# Patient Record
Sex: Female | Born: 1990 | Race: White | Hispanic: No | Marital: Married | Smoking: Never smoker
Health system: Southern US, Community
[De-identification: ages and names within clinical notes are randomized; demographics above are authoritative.]

## PROBLEM LIST (undated history)

## (undated) DIAGNOSIS — D649 Anemia, unspecified: Secondary | ICD-10-CM

## (undated) DIAGNOSIS — K219 Gastro-esophageal reflux disease without esophagitis: Secondary | ICD-10-CM

## (undated) DIAGNOSIS — U071 COVID-19: Secondary | ICD-10-CM

## (undated) DIAGNOSIS — T4145XA Adverse effect of unspecified anesthetic, initial encounter: Secondary | ICD-10-CM

## (undated) DIAGNOSIS — O1490 Unspecified pre-eclampsia, unspecified trimester: Secondary | ICD-10-CM

## (undated) DIAGNOSIS — B019 Varicella without complication: Secondary | ICD-10-CM

## (undated) DIAGNOSIS — E039 Hypothyroidism, unspecified: Secondary | ICD-10-CM

## (undated) DIAGNOSIS — T8859XA Other complications of anesthesia, initial encounter: Secondary | ICD-10-CM

## (undated) HISTORY — DX: Anemia, unspecified: D64.9

## (undated) HISTORY — DX: Varicella without complication: B01.9

## (undated) HISTORY — PX: WISDOM TOOTH EXTRACTION: SHX21

## (undated) HISTORY — DX: Hypothyroidism, unspecified: E03.9

## (undated) HISTORY — DX: COVID-19: U07.1

## (undated) HISTORY — DX: Gastro-esophageal reflux disease without esophagitis: K21.9

---
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
## 2011-04-18 ENCOUNTER — Emergency Department: Payer: Self-pay | Admitting: Emergency Medicine

## 2013-06-12 ENCOUNTER — Observation Stay: Payer: Self-pay

## 2013-06-12 LAB — URINALYSIS, COMPLETE
Bilirubin,UR: NEGATIVE
Blood: NEGATIVE
Glucose,UR: NEGATIVE mg/dL (ref 0–75)
Ketone: NEGATIVE
Leukocyte Esterase: NEGATIVE
Ph: 6 (ref 4.5–8.0)
Protein: NEGATIVE
RBC,UR: 1 /HPF (ref 0–5)
Squamous Epithelial: 2
WBC UR: 3 /HPF (ref 0–5)

## 2013-09-25 ENCOUNTER — Inpatient Hospital Stay: Payer: Self-pay | Admitting: Obstetrics and Gynecology

## 2013-09-25 LAB — PIH PROFILE
Anion Gap: 10 (ref 7–16)
BUN: 13 mg/dL (ref 7–18)
Chloride: 107 mmol/L (ref 98–107)
Co2: 21 mmol/L (ref 21–32)
Creatinine: 0.77 mg/dL (ref 0.60–1.30)
EGFR (African American): >60
Glucose: 77 mg/dL (ref 65–99)
MCV: 95 fL (ref 80–100)
Osmolality: 275 (ref 275–301)
Platelet: 142 10*3/uL — ABNORMAL LOW (ref 150–440)
Potassium: 3.9 mmol/L (ref 3.5–5.1)
RDW: 13.3 % (ref 11.5–14.5)
Sodium: 138 mmol/L (ref 136–145)
WBC: 9.3 10*3/uL (ref 3.6–11.0)

## 2013-09-25 LAB — PROTEIN / CREATININE RATIO, URINE: Protein/Creat. Ratio: 944 mg/gCREAT — ABNORMAL HIGH (ref 0–200)

## 2013-09-26 LAB — PLATELET COUNT: Platelet: 147 10*3/uL — ABNORMAL LOW (ref 150–440)

## 2013-09-26 LAB — GC/CHLAMYDIA PROBE AMP

## 2013-09-27 LAB — HEMATOCRIT: HCT: 32 % — ABNORMAL LOW (ref 35.0–47.0)

## 2013-09-30 ENCOUNTER — Emergency Department: Payer: Self-pay | Admitting: Emergency Medicine

## 2013-09-30 LAB — CBC WITH DIFFERENTIAL/PLATELET
Basophil #: 0 10*3/uL (ref 0.0–0.1)
Basophil %: 0.1 %
Eosinophil #: 0 10*3/uL (ref 0.0–0.7)
Eosinophil %: 0.3 %
HCT: 29.4 % — ABNORMAL LOW (ref 35.0–47.0)
HGB: 10.3 g/dL — ABNORMAL LOW (ref 12.0–16.0)
Lymphocyte #: 1.1 10*3/uL (ref 1.0–3.6)
Lymphocyte %: 9.9 %
Monocyte #: 0.7 x10 3/mm (ref 0.2–0.9)
Monocyte %: 7 %
RBC: 3.09 10*6/uL — ABNORMAL LOW (ref 3.80–5.20)
RDW: 12.9 % (ref 11.5–14.5)

## 2013-09-30 LAB — URINALYSIS, COMPLETE
Bilirubin,UR: NEGATIVE
Glucose,UR: NEGATIVE mg/dL (ref 0–75)
Protein: 100
RBC,UR: 22 /HPF (ref 0–5)
Specific Gravity: 1.004 (ref 1.003–1.030)
Squamous Epithelial: 12
WBC UR: 72 /HPF (ref 0–5)

## 2013-09-30 LAB — BASIC METABOLIC PANEL
Anion Gap: 7 (ref 7–16)
Calcium, Total: 8.8 mg/dL (ref 8.5–10.1)
Chloride: 105 mmol/L (ref 98–107)
Co2: 25 mmol/L (ref 21–32)
EGFR (African American): >60
EGFR (Non-African Amer.): >60
Glucose: 90 mg/dL (ref 65–99)
Potassium: 3.4 mmol/L — ABNORMAL LOW (ref 3.5–5.1)
Sodium: 137 mmol/L (ref 136–145)

## 2013-10-02 LAB — URINE CULTURE

## 2014-06-12 ENCOUNTER — Telehealth: Payer: Self-pay | Admitting: Internal Medicine

## 2014-06-12 NOTE — Telephone Encounter (Signed)
Received 20 pages from [facility], sent to Dr. Elvera LennoxGherghe at [facility] via interoffice. 06/12/14/ss

## 2014-06-17 ENCOUNTER — Encounter: Payer: Self-pay | Admitting: Internal Medicine

## 2014-07-10 ENCOUNTER — Ambulatory Visit (INDEPENDENT_AMBULATORY_CARE_PROVIDER_SITE_OTHER): Payer: 59 | Admitting: Internal Medicine

## 2014-07-10 ENCOUNTER — Encounter: Payer: Self-pay | Admitting: Internal Medicine

## 2014-07-10 VITALS — BP 100/60 | HR 70 | Temp 98.5°F | Resp 12 | Ht 62.5 in | Wt 185.8 lb

## 2014-07-10 DIAGNOSIS — E039 Hypothyroidism, unspecified: Secondary | ICD-10-CM

## 2014-07-10 HISTORY — DX: Hypothyroidism, unspecified: E03.9

## 2014-07-10 LAB — TSH: TSH: 0.66 u[IU]/mL (ref 0.35–4.50)

## 2014-07-10 LAB — T4, FREE: Free T4: 1.08 ng/dL (ref 0.60–1.60)

## 2014-07-10 NOTE — Progress Notes (Signed)
Patient ID: Olivia Henderson, female   DOB: Jan 04, 1991, 23 y.o.   MRN: [id number]   HPI  Olivia Henderson is a 23 y.o.-year-old female, referred by Dr Guinevere Ferrari, for management of hypothyroidism. She saw Dr Johny Chess in the past.  Pt. has been dx with hypothyroidism in 08/2010; she was previously on Armour. She is now on Levothyroxine 50 mcg, taken: - fasting - with water - separated by >30 min from b'fast  - no calcium, iron, PPIs, multivitamins   Patient delivered a healthy son on 09/19/2013. She was on 88 mcg of levothyroxine before the pregnancy and then decreased to 75 mcg during the pregnancy. She decreased the dose further to 50 mcg in 01/2014, after a TSH checked through her workplace returned undetectable. Of note, she had preeclampsia during the pregnancy.  I reviewed pt's thyroid tests - per reviewed records from [facility]: 01/2014: TSH undetectable (reportedly) 11/20/2013: TSH 0.217 07/02/2013: TSH 1.360 06/10/2013: TSH 1.210 08/2010: TSH 5  Pt describes: - + fatigue - + weight gain: 60 lbs with the pregnancy - now 30 lbs less  - no cold or heat intolerance - no constipation - + dry skin - + hair falling (started 2 weeks ago) - no depression/anxiety  Pt denies feeling nodules in neck, hoarseness, dysphagia/odynophagia, SOB with lying down.  She has no FH of thyroid disorders. No FH of thyroid cancer.  No h/o radiation tx to head or neck. No recent use of iodine supplements.  No immediate plans for another pregnancy.  ROS: Constitutional: + weight gain, + fatigue, no subjective hyperthermia/hypothermia Eyes: + blurry vision, no xerophthalmia ENT: no sore throat, no nodules palpated in throat, no dysphagia/odynophagia, no hoarseness Cardiovascular: no CP/SOB/palpitations/+ leg swelling Respiratory: no cough/SOB Gastrointestinal: no N/V/D/C/+ acid reflux Musculoskeletal: + both: muscle/joint aches Skin: no rashes, + hair loss, + dry skin Neurological: no  tremors/numbness/tingling/dizziness, + HA Psychiatric: no depression/anxiety  Past Medical History  Diagnosis Date  . Unspecified hypothyroidism 07/10/2014  . GERD (gastroesophageal reflux disease)    Past Surgical hx: - Wisdom teeth extraction in 2010 - C section in 08/2013  History   Social History  . 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   Social History Main Topics  . Smoking status: Never Smoker   . Smokeless tobacco: No  . Alcohol Use: No  . Drug Use: No  Exercises by walking 3x a week  Social History Narrative   Engaged   71 month old son   First menstrual cycle: 13 yrs   1 pregnancy   CNA   Current Outpatient Rx  Name  Route  Sig  Dispense  Refill  . levothyroxine (SYNTHROID, LEVOTHROID) 50 MCG tablet   Oral   Take 50 mcg by mouth daily before breakfast.          NKDA  FH: - DM2 in grandfather - heart ds in [place] - cancer in [place]  PE: BP 100/60  Pulse 70  Temp(Src) 98.5 F (36.9 C) (Oral)  Resp 12  Ht 5' 2.5" (1.588 m)  Wt 185 lb 12.8 oz (84.278 kg)  BMI 33.42 kg/m2  SpO2 97% Wt Readings from Last 3 Encounters:  07/10/14 185 lb 12.8 oz (84.278 kg)   Constitutional: overweight, in NAD Eyes: PERRLA, EOMI, no exophthalmos ENT: moist mucous membranes, no thyromegaly, no cervical lymphadenopathy Cardiovascular: RRR, No MRG Respiratory: CTA B Gastrointestinal: abdomen  soft, NT, ND, BS+ Musculoskeletal: no deformities, strength intact in all 4 Skin: moist, warm, no rashes Neurological: no tremor with outstretched hands, DTR normal in all 4  ASSESSMENT: 1. Hypothyroidism  2. Weight gain  PLAN:  1. Patient with long-standing hypothyroidism, on levothyroxine therapy. She appears euthyroid. She does not appear to have a goiter, thyroid nodules, or neck compression symptoms - We discussed about correct intake of levothyroxine, fasting, with water, separated by  at least 30 minutes from breakfast, and separated by more than 4 hours from calcium, iron, multivitamins, acid reflux medications (PPIs). - will check thyroid tests today: TSH, free T4 and will add TPO Abs to check if she has Hashimoto's thyroiditis as a cause for her hypothyroidism - If these are abnormal, she will need to return in 6-8 weeks for repeat labs - If these are normal, I will see her back in 4 months  2. Weight gain - discussed possible risks of weight loss meds - we discussed about the need to have a healthy diet - recommended several healthy substitutions - see pt instructions  Component     Latest Ref Rng 07/10/2014  Thyroid Peroxidase Antibody     <9 IU/mL 6  TSH     0.35 - 4.50 uIU/mL 0.66  Free T4     0.60 - 1.60 ng/dL 1.61   No obvious Hashimoto's thyroiditis. TFTs normal. Will continue current LT4 dose, of 50 mcg daily and repeat TFTs when she comes back.

## 2014-07-10 NOTE — Patient Instructions (Signed)
Please stop at the lab. Please return in 4 months, but we may need labs before then. Take the thyroid hormone every day, with water, >30 minutes before breakfast, separated by >4 hours from acid reflux medications, calcium, iron, multivitamins.  Please consider the following ways to cut down carbs and fat and increase fiber and micronutrients in your diet:  - substitute whole grain for white bread or pasta - substitute brown rice for white rice - substitute 90-calorie flat bread pieces for slices of bread when possible - substitute sweet potatoes or yams for white potatoes - substitute humus for margarine - substitute tofu for cheese when possible - substitute almond or rice milk for regular milk (would not drink soy milk daily due to concern for soy estrogen influence on breast cancer risk) - substitute dark chocolate for other sweets when possible - substitute water - can add lemon or orange slices for taste - for diet sodas (artificial sweeteners will trick your body that you can eat sweets without getting calories and will lead you to overeating and weight gain in the long run) - do not skip breakfast or other meals (this will slow down the metabolism and will result in more weight gain over time)  - can try smoothies made from fruit and almond/rice milk in am instead of regular breakfast - can also try old-fashioned (not instant) oatmeal made with almond/rice milk in am - order the dressing on the side when eating salad at a restaurant (pour less than half of the dressing on the salad) - eat as little meat as possible - can try juicing, but should not forget that juicing will get rid of the fiber, so would alternate with eating raw veg./fruits or drinking smoothies - use as little oil as possible, even when using olive oil - can dress a salad with a mix of balsamic vinegar and lemon juice, for e.g. - use agave nectar, stevia sugar, or regular sugar rather than artificial sweateners -  steam or broil/roast veggies  - snack on veggies/fruit/nuts (unsalted, preferably) when possible, rather than processed foods - reduce or eliminate aspartame in diet (it is in diet sodas, chewing gum, etc) Read the labels!  Try to read Dr. Katherina Right book: "Program for Reversing Diabetes" for the vegan concept and other ideas for healthy eating.

## 2014-07-11 ENCOUNTER — Encounter: Payer: Self-pay | Admitting: Internal Medicine

## 2014-07-11 LAB — THYROID PEROXIDASE ANTIBODY: Thyroperoxidase Ab SerPl-aCnc: 6 IU/mL (ref <9)

## 2014-07-23 ENCOUNTER — Other Ambulatory Visit: Payer: Self-pay | Admitting: *Deleted

## 2014-07-23 ENCOUNTER — Telehealth: Payer: Self-pay | Admitting: Internal Medicine

## 2014-07-23 MED ORDER — LEVOTHYROXINE SODIUM 50 MCG PO TABS: 50.0000 ug | ORAL_TABLET | Freq: Every day | ORAL | Status: DC

## 2014-07-23 NOTE — Telephone Encounter (Signed)
Done

## 2014-07-23 NOTE — Telephone Encounter (Signed)
We need to call in to [facility] the levothyroxine 50 mg please

## 2014-11-10 ENCOUNTER — Encounter: Payer: Self-pay | Admitting: Internal Medicine

## 2014-11-10 ENCOUNTER — Ambulatory Visit (INDEPENDENT_AMBULATORY_CARE_PROVIDER_SITE_OTHER): Payer: 59 | Admitting: Internal Medicine

## 2014-11-10 VITALS — BP 102/64 | HR 84 | Temp 98.4°F | Resp 12 | Wt 183.0 lb

## 2014-11-10 DIAGNOSIS — E039 Hypothyroidism, unspecified: Secondary | ICD-10-CM

## 2014-11-10 LAB — T4, FREE: Free T4: 0.99 ng/dL (ref 0.60–1.60)

## 2014-11-10 LAB — TSH: TSH: 3.07 u[IU]/mL (ref 0.35–4.50)

## 2014-11-10 MED ORDER — LEVOTHYROXINE SODIUM 50 MCG PO TABS: 50.0000 ug | ORAL_TABLET | Freq: Every day | ORAL | Status: DC

## 2014-11-10 NOTE — Patient Instructions (Signed)
Please stop at the lab.  Please return in 1 year.  
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
## 2015-01-15 ENCOUNTER — Ambulatory Visit: Payer: Self-pay | Admitting: Obstetrics & Gynecology

## 2015-02-20 NOTE — Op Note (Signed)
PATIENT NAME:  Olivia Henderson, Olivia Henderson#:  [id number] DATE OF BIRTH:  1991-06-27  DATE OF PROCEDURE:  09/26/2013  PREOPERATIVE DIAGNOSIS: Term intrauterine pregnancy, failure to progress secondary to cephalopelvic disproportion.   POSTOPERATIVE DIAGNOSIS: Term intrauterine pregnancy, failure to progress secondary to cephalopelvic disproportion.    PROCEDURE:  Low transverse cesarean section, placement of On-Q pain pump.   SURGEON:  Dierdre Searles. Paul Jessia Kief, MD  ANESTHESIA: Epidural.   ESTIMATED BLOOD LOSS: 500 mL   COMPLICATIONS: None.   FINDINGS: Normal tubes, ovaries, and uterus. Viable female weighing 8 pounds 8 ounces, with Apgar scores of 9 and 9 at one and five minutes, respectively.   DISPOSITION: To recovery room in stable condition.   TECHNIQUE: The patient is prepped and draped in the usual sterile fashion after adequate anesthesia is obtained in the supine position on the Operating Room table. Scalpel is used to create a low transverse skin incision, which is dissected down to the level of the rectus fascia, which is then dissected bilaterally using Mayo scissors. The rectus muscles are dissected away from the rectus fascia and then separated in the midline. Peritoneum is penetrated, and the bladder is dissected and inferiorly retracted. A scalpel is then used to create a low transverse hysterotomy incision that is then extended by blunt dissection. Amniotomy reveals clear fluid, and infant's head is grasped and delivered. Nuchal cord is reduced, and infant is completely delivered, with clamping and cutting of umbilical cord.   Cord blood is obtained. The placenta is manually extracted. The uterus is externalized, and cleansed of all membranes and debris using a moist sponge. The hysterotomy incision is closed with a running #1 Vicryl suture in a locking fashion, followed by a second layer to imbricate the first layer, with excellent hemostasis noted. The uterus is then placed back in the  intra-abdominal cavity, and the paracolic gutters are irrigated with warm saline. Re-examination of the incisions reveals excellent hemostasis.   The peritoneum is then closed with a Vicryl suture. Trocars are placed through the abdomen into the subfascial space, and then the silver soaker catheter associated with the On-Q pain pump is then threaded into place. The rectus fascia is closed with 0 Maxon suture. Subcutaneous tissues are irrigated and hemostasis is assured using electrocautery. Skin is closed with 4-0 Vicryl suture in a subcuticular fashion followed by placement of Dermabond. The On-Q pain pump catheters are flushed with 5 mL each of bupivacaine, and then sealed in place with Dermabond, Steri-Strips and a Tegaderm bandage. The patient goes to recovery room in stable condition. All sponge, instrument, and needle counts are correct.     ____________________________ R. Annamarie MajorPaul Ira Dougher, MD rph:Henderson D: 09/26/2013 20:28:00 ET T: 09/26/2013 20:49:31 ET JOB#: [id number]  cc: Dierdre Searles. Paul Praneel Haisley, MD, <Dictator> Nadara MustardOBERT P Boyce Keltner MD ELECTRONICALLY SIGNED 09/27/2013 7:41

## 2015-03-10 NOTE — H&P (Signed)
L&D Evaluation:  History:  HPI Pt is a 24 yo G1P0 at 39.[redacted] weeks GA who presents to L&D after being sent over by the office for r/o PIH. She reports seeing spots about once a day x 3-4 weeks. She denies RUQ pain, headaches, and lightning flashes. Her labs in the office were performed on 09/24/13 and were significant for total protien of 46.5, alk phos- 3.2, platelets- 137, protien/creatinine ratio is 462. Her prenatal care is significant for hyperthyroidism. She is O+, RI, VI, GBS-, and had her TDaP this pregnancy.   Presents with other, elevated BP   Patient's Medical History Thyroid Disease   Patient's Surgical History other  wisdom teeth extraction,   Medications Pre Natal Vitamins  synthroid   Allergies NKDA   Social History none   Family History Non-Contributory   ROS:  ROS All systems were reviewed.  HEENT, CNS, GI, GU, Respiratory, CV, Renal and Musculoskeletal systems were found to be normal.   Exam:  Vital Signs some elevated BP as high as 140/92   General no apparent distress   Mental Status clear   Chest clear   Heart normal sinus rhythm   Abdomen gravid, non-tender   Back no CVAT   Pelvic no external lesions   Mebranes Intact   FHT normal rate with no decels, 130's, +accels   Ucx absent   Skin dry, no lesions, no rashes   Impression:  Impression IUP at 39.4, r/o mild pre-eclampsia   Plan:  Plan EFM/NST, monitor BP, PIH panel, possible IOL for mild PIH- pending labs and frequent BP checks- discussed plan with Dr. Glennon Mac who agrees   Electronic Signatures: Louisa Second (CNM)  (Signed [phone] 19:26)  Authored: L&D Evaluation   Last Updated: 26-Nov-14 19:26 by Louisa Second (CNM)

## 2015-03-10 NOTE — H&P (Signed)
L&D Evaluation:  History Expanded:  HPI 24 yo G1, EDD of 09/29/13 per LMP, + 7 week US, presents at 24 3/7 wks with report of several drops of vaginal bleeding this afternoon. Denies ctx, pain, dysuria, rectal pain, vaginal d/c, recent intercourse or trauma. [facility], early entry to care, Hypothyroidism.   Blood Type (Maternal) O positive   Group B Strep Results Maternal (Result >5wks must be treated as unknown) unknown/result > 5 weeks ago   Maternal HIV Negative   Maternal Syphilis Ab Nonreactive   Maternal Varicella Immune   Rubella Results (Maternal) immune   Patient's Medical History Thyroid Disease   Medications Pre Natal Vitamins  Synthroid   Allergies NKDA   Exam:  Vital Signs stable   General no apparent distress   Mental Status clear   Abdomen gravid, non-tender, soft   Pelvic visually closed, wet mount negative, white vaginal d/c (no bleeding noted)   Mebranes Intact   FHT + FHTs appropriate for Gestational age, +FM audible   Ucx absent   Other UA: 1+ bacteria, 1 RBC, negative blood   Impression:  Impression IUP at 24 wks with vaginal bleeding, now resolved   Plan:  Plan discharge   Follow Up Appointment already scheduled. 9/2   Electronic Signatures: Vella KohlerBrothers, Luman Holway K (CNM)  (Signed 13-Aug-14 21:05)  Authored: L&D Evaluation   Last Updated: 13-Aug-14 21:05 by Vella KohlerBrothers, Damyn Weitzel K (CNM)

## 2015-11-11 ENCOUNTER — Ambulatory Visit: Payer: 59 | Admitting: Internal Medicine

## 2015-11-26 ENCOUNTER — Telehealth: Payer: Self-pay | Admitting: Internal Medicine

## 2015-11-26 MED ORDER — LEVOTHYROXINE SODIUM 50 MCG PO TABS: 50.0000 ug | ORAL_TABLET | Freq: Every day | ORAL | Status: DC

## 2015-11-26 NOTE — Telephone Encounter (Signed)
Refill sent to [facility].

## 2015-11-26 NOTE — Telephone Encounter (Signed)
Pt needs refills on levothyroxine called to [facility] out[facility] pharmacy, she is out right now.

## 2015-12-07 ENCOUNTER — Encounter: Payer: Self-pay | Admitting: Physician Assistant

## 2015-12-07 ENCOUNTER — Ambulatory Visit: Payer: Self-pay | Admitting: Physician Assistant

## 2015-12-07 VITALS — BP 110/70 | HR 60 | Temp 98.8°F

## 2015-12-07 DIAGNOSIS — J00 Acute nasopharyngitis [common cold]: Secondary | ICD-10-CM

## 2015-12-07 MED ORDER — FLUTICASONE PROPIONATE 50 MCG/ACT NA SUSP: 2.0000 | Freq: Every day | NASAL | Status: DC

## 2015-12-07 NOTE — Progress Notes (Signed)
S: C/o runny nose and congestion for 3 days, some ear pressure, no fever, chills, cp/sob, v/d; mucus was green and thick, but is clearing up today, felt better after using a mucinex  O: ZO:XWRUEA wnl, nad, perrl eomi, normocephalic, tms dull, nasal mucosa red and swollen, throat injected, neck supple no lymph, lungs c t a, cv rrr, neuro intact  A:  Acute sinusitis/common cold   P: flonase, mucinex, if worsening later in the week can call in antibioticdrink fluids, continue regular meds , use otc meds of choice, return if not improving in 5 days, return earlier if worsening

## 2015-12-11 ENCOUNTER — Ambulatory Visit (INDEPENDENT_AMBULATORY_CARE_PROVIDER_SITE_OTHER): Payer: 59 | Admitting: Internal Medicine

## 2015-12-11 ENCOUNTER — Encounter: Payer: Self-pay | Admitting: Internal Medicine

## 2015-12-11 VITALS — BP 114/62 | HR 82 | Temp 98.0°F | Resp 12 | Wt 185.0 lb

## 2015-12-11 DIAGNOSIS — E039 Hypothyroidism, unspecified: Secondary | ICD-10-CM

## 2015-12-11 NOTE — Progress Notes (Signed)
Patient ID: Olivia Henderson, female   DOB: [birth year], 25 y.o.   MRN: [id number]   HPI  Olivia Henderson is a 25 y.o.-year-old female, initially referred by Dr Guinevere Ferrari, for management of hypothyroidism. She saw Dr Johny Chess in the past. Last visit 4 mo ago. Her mother accompanies her today and offers part of the hx.  PCP: switching as her PCP retired.  Pt. has been dx with hypothyroidism in 08/2010; she was previously on Armour. She is now on Levothyroxine 50 mcg, taken: - fasting - with water - separated by >30 min from b'fast  - no calcium, iron, PPIs, multivitamins   I reviewed pt's thyroid tests - per reviewed records from Epic and from [facility] - last set of labs normal on 50 mcg LT4: Lab Results  Component Value Date   TSH 3.07 11/10/2014   TSH 0.66 07/10/2014   FREET4 0.99 11/10/2014   FREET4 1.08 07/10/2014  01/2014: TSH undetectable (reportedly) 11/20/2013: TSH 0.217 07/02/2013: TSH 1.360 06/10/2013: TSH 1.210 08/2010: TSH 5  Component     Latest Ref Rng 07/10/2014  Thyroid Peroxidase Antibody     <9 IU/mL 6   Pt describes: - no fatigue - no weight loss or gain - no cold or heat intolerance - no constipation - no dry skin - no hair falling - no depression/anxiety - + HAs - more frequent in last 2 weeks.  Pt denies feeling nodules in neck, hoarseness, dysphagia/odynophagia, SOB with lying down.  No immediate plans for another pregnancy. Patient delivered a healthy son on 09/19/2013. She was on 88 mcg of levothyroxine before the pregnancy and then decreased to 75 mcg during the pregnancy. She decreased the dose further to 50 mcg in 01/2014, after a TSH checked through her workplace returned undetectable. Of note, she had preeclampsia during the pregnancy.  ROS: Constitutional: see HPI Eyes: no blurry vision, no xerophthalmia ENT: no sore throat, no nodules palpated in throat, no dysphagia/odynophagia, no hoarseness Cardiovascular: no CP/SOB/palpitations/  leg swelling Respiratory: no cough/SOB Gastrointestinal: no N/V/D/C/acid reflux Musculoskeletal: no muscle/joint aches Skin: no rashes, no hair loss, no dry skin Neurological: no tremors/numbness/tingling/dizziness, + HA  I reviewed pt's medications, allergies, PMH, social hx, family hx, and changes were documented in the history of present illness. Otherwise, unchanged from my initial visit note:  Past Medical History  Diagnosis Date  . Unspecified hypothyroidism 07/10/2014  . GERD (gastroesophageal reflux disease)    Past Surgical hx: - Wisdom teeth extraction in 2010 - C section in 08/2013  History   Social History  . 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   Social History Main Topics  . Smoking status: Never Smoker   . Smokeless tobacco: No  . Alcohol Use: No  . Drug Use: No  Exercises by walking 3x a week  Social History Narrative   Engaged   19 month old son   First menstrual cycle: 13 yrs   1 pregnancy   CNA   Current Outpatient Rx  Name  Route  Sig  Dispense  Refill  . fluticasone (FLONASE) 50 MCG/ACT nasal spray   Each Nare   Place 2 sprays into both nostrils daily.   16 g   6   . levothyroxine (SYNTHROID, LEVOTHROID) 50 MCG tablet   Oral   Take 1 tablet (50 mcg total) by mouth daily before breakfast. **PT NEEDS APPT  FOR FURTHER REFILLS**   90 tablet   0    NKDA  FH: - DM2 in grandfather - heart ds in [place] - cancer in [place]  PE: BP 114/62 mmHg  Pulse 82  Temp(Src) 98 F (36.7 C) (Oral)  Resp 12  Wt 185 lb (83.915 kg)  SpO2 98% Body mass index is 33.28 kg/(m^2). Wt Readings from Last 3 Encounters:  12/11/15 185 lb (83.915 kg)  11/10/14 183 lb (83.008 kg)  07/10/14 185 lb 12.8 oz (84.278 kg)   Constitutional: overweight, in NAD Eyes: PERRLA, EOMI, no exophthalmos ENT: moist mucous membranes, no thyromegaly, no cervical lymphadenopathy Cardiovascular:  RRR, No MRG Respiratory: CTA B Gastrointestinal: abdomen soft, NT, ND, BS+ Musculoskeletal: no deformities, strength intact in all 4 Skin: moist, warm, no rashes; scraped skin L forehead, which is also swollen Neurological: no tremor with outstretched hands, DTR normal in all 4  ASSESSMENT: 1. Hypothyroidism  PLAN:  1. Patient with long-standing hypothyroidism, on levothyroxine therapy. She appears euthyroid. She does not appear to have a goiter, thyroid nodules, or neck compression symptoms - We discussed about correct intake of levothyroxine, fasting, with water, separated by at least 30 minutes from breakfast, and separated by more than 4 hours from calcium, iron, multivitamins, acid reflux medications (PPIs). She is taking it correctly. - will check thyroid tests today: TSH, free T4 today - If these are abnormal, she will need to return in 6-8 weeks for repeat labs - If these are normal, I will see her back in 1 year - needs a refill of LT4  Component     Latest Ref Rng 12/11/2015  TSH     0.450 - 4.500 uIU/mL 3.700  T4,Free(Direct)     0.82 - 1.77 ng/dL 5.36   TSH is normal. We'll continue the current dose of levothyroxine. However, since the TSH is slightly higher in the normal range, we'll recheck her thyroid tests in a month and a half.

## 2015-12-11 NOTE — Patient Instructions (Signed)
Please stop at the lab.  Please continue Levothyroxine 50 mcg daily.  Take the thyroid hormone every day, with water, at least 30 minutes before breakfast, separated by at least 4 hours from: - acid reflux medications - calcium - iron - multivitamins  Please return in 1 year.  

## 2015-12-12 LAB — TSH: TSH: 3.7 u[IU]/mL (ref 0.450–4.500)

## 2015-12-12 LAB — T4, FREE: FREE T4: 1.34 ng/dL (ref 0.82–1.77)

## 2015-12-16 MED ORDER — LEVOTHYROXINE SODIUM 50 MCG PO TABS: 50.0000 ug | ORAL_TABLET | Freq: Every day | ORAL | Status: DC

## 2015-12-19 DIAGNOSIS — K529 Noninfective gastroenteritis and colitis, unspecified: Secondary | ICD-10-CM | POA: Diagnosis not present

## 2016-01-28 ENCOUNTER — Other Ambulatory Visit (INDEPENDENT_AMBULATORY_CARE_PROVIDER_SITE_OTHER): Payer: 59

## 2016-01-28 DIAGNOSIS — E039 Hypothyroidism, unspecified: Secondary | ICD-10-CM

## 2016-01-28 LAB — T4, FREE: Free T4: 0.91 ng/dL (ref 0.60–1.60)

## 2016-01-28 LAB — TSH: TSH: 2.68 u[IU]/mL (ref 0.35–4.50)

## 2016-02-24 ENCOUNTER — Telehealth: Payer: Self-pay | Admitting: Internal Medicine

## 2016-02-24 MED ORDER — LEVOTHYROXINE SODIUM 50 MCG PO TABS: 50.0000 ug | ORAL_TABLET | Freq: Every day | ORAL | Status: DC

## 2016-02-24 NOTE — Telephone Encounter (Signed)
Pt called and said that her Levothyroxine was never sent into the pharmacy. [facility]

## 2016-02-24 NOTE — Telephone Encounter (Signed)
Refill sent to pt's pharmacy. 

## 2016-04-08 ENCOUNTER — Ambulatory Visit: Payer: Self-pay | Admitting: Physician Assistant

## 2016-04-08 ENCOUNTER — Encounter: Payer: Self-pay | Admitting: Physician Assistant

## 2016-04-08 VITALS — BP 110/80 | HR 76 | Temp 98.5°F

## 2016-04-08 DIAGNOSIS — J209 Acute bronchitis, unspecified: Secondary | ICD-10-CM

## 2016-04-08 MED ORDER — PSEUDOEPH-BROMPHEN-DM 30-2-10 MG/5ML PO SYRP: 5.0000 mL | ORAL_SOLUTION | Freq: Four times a day (QID) | ORAL | Status: DC | PRN

## 2016-04-08 MED ORDER — AZITHROMYCIN 250 MG PO TABS: ORAL_TABLET | ORAL | Status: DC

## 2016-04-08 NOTE — Progress Notes (Signed)
   Subjective:Cough    Patient ID: Olivia ProwsChelsie Nicole Henderson, female    DOB: 04/17/1991, 25 y.o.   MRN: [id number]  HPI Patient states cough for 1 1/2 weeks. Cough is worse laying down at night. Also states nasal congestion with post nasal drainage.  Denies fever/chills or N/V/D. No palliative measures for compliant.   Review of Systems Hypothyroidism    Objective:   Physical Exam No acute distress. Bilateral edematous nasal turbinates. Post nasal drainage. Neck supple, Lungs with upper lobe Rales. Heart RRR.       Assessment & Plan:Bronchitis  Take Zithromax and Bromfed DM as directed.  Follow up 3 days if no improvement.

## 2016-05-16 ENCOUNTER — Other Ambulatory Visit: Payer: Self-pay

## 2016-05-16 ENCOUNTER — Telehealth: Payer: Self-pay | Admitting: Internal Medicine

## 2016-05-16 MED ORDER — LEVOTHYROXINE SODIUM 50 MCG PO TABS: 50.0000 ug | ORAL_TABLET | Freq: Every day | ORAL | Status: DC

## 2016-05-16 NOTE — Telephone Encounter (Signed)
Synthroid refill sent into pharmacy for patient.

## 2016-05-16 NOTE — Telephone Encounter (Signed)
PT [facility]

## 2016-06-03 ENCOUNTER — Encounter: Payer: Self-pay | Admitting: Family Medicine

## 2016-06-03 ENCOUNTER — Ambulatory Visit (INDEPENDENT_AMBULATORY_CARE_PROVIDER_SITE_OTHER): Payer: 59 | Admitting: Family Medicine

## 2016-06-03 VITALS — BP 109/73 | HR 70 | Temp 98.3°F | Ht 63.0 in | Wt 188.4 lb

## 2016-06-03 DIAGNOSIS — F909 Attention-deficit hyperactivity disorder, unspecified type: Secondary | ICD-10-CM

## 2016-06-03 DIAGNOSIS — B019 Varicella without complication: Secondary | ICD-10-CM | POA: Insufficient documentation

## 2016-06-03 DIAGNOSIS — F988 Other specified behavioral and emotional disorders with onset usually occurring in childhood and adolescence: Secondary | ICD-10-CM

## 2016-06-03 NOTE — Progress Notes (Signed)
Subjective:  Patient ID: Olivia Henderson, female    DOB: 07-23-1991  Age: 25 y.o. MRN: [id number]  CC: ADD, requesting medication  HPI Olivia Henderson is a 25 y.o. female presents to the clinic today with the above complaint. This is a new patient visit.  ADD  Patient states that she was diagnosed with ADD/ADHD as a child.  She was previously on treatment with Adderall.  She has been off of the medication for quite some time as she has not had a real need for it.  Patient states that her ADHD/ADD is particular problematic when in a classroom setting where she has difficulty focusing and staying on task.  Patient states that she is going back to school in August and would like to restart her prior medication.  She is unsure where she's had a formal evaluation regarding this in the past.  No records available to me at this time. Her prior PCP is currently deceased and records are unavailable.  PMH, Surgical Hx, Family Hx, Social History reviewed and updated as below.  Past Medical History:  Diagnosis Date  . Chicken pox   . GERD (gastroesophageal reflux disease)   . Unspecified hypothyroidism 07/10/2014   Past Surgical History:  Procedure Laterality Date  . CESAREAN SECTION  2014   Family History  Problem Relation Age of Onset  . Kidney cancer Maternal Grandmother   . Diabetes Maternal Grandfather    Social History  Substance Use Topics  . Smoking status: Never Smoker  . Smokeless tobacco: Never Used  . Alcohol use No   Review of Systems  Cardiovascular:       Swelling.  Psychiatric/Behavioral: Positive for decreased concentration.  All other systems reviewed and are negative.  Objective:   Today's Vitals: BP 109/73 (BP Location: Right Arm, Patient Position: Sitting, Cuff Size: Normal)   Pulse 70   Temp 98.3 F (36.8 C) (Oral)   Ht  (1.6 m)   Wt 188 lb 6 oz (85.4 kg)   SpO2 97%   BMI 33.37 kg/m   Physical Exam  Constitutional: She  is oriented to person, place, and time. She appears well-developed and well-nourished. No distress.  HENT:  Head: Normocephalic and atraumatic.  Eyes: Conjunctivae are normal. No scleral icterus.  Neck: Neck supple. No thyromegaly present.  Cardiovascular: Normal rate and regular rhythm.   No murmur heard. Pulmonary/Chest: Effort normal and breath sounds normal. She has no wheezes. She has no rales.  Abdominal: Soft. She exhibits no distension. There is no tenderness. There is no rebound and no guarding.  Musculoskeletal: Normal range of motion. She exhibits no edema.  Lymphadenopathy:    She has no cervical adenopathy.  Neurological: She is alert and oriented to person, place, and time.  Skin: Skin is warm and dry. No rash noted.  Psychiatric: She has a normal mood and affect.  Vitals reviewed.  Assessment & Plan:   Problem List Items Addressed This Visit    ADD (attention deficit disorder) - Primary    New problem (to me). Needs formal assessment prior to prescribing. Sending to psychology.      Relevant Orders   Ambulatory referral to Psychology   RESOLVED: Chicken pox    Other Visit Diagnoses   None.     Outpatient Encounter Prescriptions as of 06/03/2016  Medication Sig  . levonorgestrel (MIRENA) 20 MCG/24HR IUD 1 each by Intrauterine route once.  Marland Kitchen levothyroxine (SYNTHROID, LEVOTHROID) 50 MCG tablet Take 1 tablet (50 mcg  total) by mouth daily before breakfast.  . [DISCONTINUED] azithromycin (ZITHROMAX) 250 MG tablet Take Two tablets on day 1, then one tablet daily.  . [DISCONTINUED] brompheniramine-pseudoephedrine-DM 30-2-10 MG/5ML syrup Take 5 mLs by mouth 4 (four) times daily as needed.  . [DISCONTINUED] fluticasone (FLONASE) 50 MCG/ACT nasal spray Place 2 sprays into both nostrils daily.   No facility-administered encounter medications on file as of 06/03/2016.     Follow-up: Following psychology eval  Everlene Other DO [facility]

## 2016-06-03 NOTE — Patient Instructions (Signed)
We will call with the referral.  Once it returns and confirms we can start your medication.  Take care  Dr. Adriana Simas

## 2016-06-03 NOTE — Assessment & Plan Note (Signed)
New problem (to me). Needs formal assessment prior to prescribing. Sending to psychology.

## 2016-06-03 NOTE — Progress Notes (Signed)
Pre visit review using our clinic review tool, if applicable. No additional management support is needed unless otherwise documented below in the visit note. 

## 2016-06-06 DIAGNOSIS — F900 Attention-deficit hyperactivity disorder, predominantly inattentive type: Secondary | ICD-10-CM | POA: Diagnosis not present

## 2016-06-08 DIAGNOSIS — F900 Attention-deficit hyperactivity disorder, predominantly inattentive type: Secondary | ICD-10-CM | POA: Diagnosis not present

## 2016-06-14 ENCOUNTER — Encounter: Payer: Self-pay | Admitting: Family Medicine

## 2016-06-15 DIAGNOSIS — F900 Attention-deficit hyperactivity disorder, predominantly inattentive type: Secondary | ICD-10-CM | POA: Diagnosis not present

## 2016-06-20 ENCOUNTER — Other Ambulatory Visit: Payer: Self-pay | Admitting: Family Medicine

## 2016-06-20 MED ORDER — AMPHETAMINE-DEXTROAMPHET ER 20 MG PO CP24: 20.0000 mg | ORAL_CAPSULE | Freq: Every day | ORAL | 0 refills | Status: DC

## 2016-07-21 ENCOUNTER — Encounter: Payer: Self-pay | Admitting: Family Medicine

## 2016-07-21 ENCOUNTER — Other Ambulatory Visit: Payer: Self-pay | Admitting: Family Medicine

## 2016-07-22 ENCOUNTER — Other Ambulatory Visit: Payer: Self-pay | Admitting: Family Medicine

## 2016-07-22 MED ORDER — AMPHETAMINE-DEXTROAMPHET ER 20 MG PO CP24: 20.0000 mg | ORAL_CAPSULE | Freq: Every day | ORAL | 0 refills | Status: DC

## 2016-08-22 ENCOUNTER — Other Ambulatory Visit: Payer: Self-pay | Admitting: Family Medicine

## 2016-08-22 MED ORDER — AMPHETAMINE-DEXTROAMPHET ER 20 MG PO CP24: 20.0000 mg | ORAL_CAPSULE | Freq: Every day | ORAL | 0 refills | Status: DC

## 2016-08-22 NOTE — Progress Notes (Addendum)
Pt called and told rx ready for pick up.

## 2016-08-22 NOTE — Telephone Encounter (Signed)
Last refill sent in 07/22/16. Last seen 06/03/16.

## 2016-08-26 ENCOUNTER — Other Ambulatory Visit: Payer: Self-pay | Admitting: Internal Medicine

## 2016-08-29 MED ORDER — LEVOTHYROXINE SODIUM 50 MCG PO TABS: 50.0000 ug | ORAL_TABLET | Freq: Every day | ORAL | 0 refills | Status: DC

## 2016-10-12 ENCOUNTER — Ambulatory Visit: Payer: Self-pay | Admitting: Physician Assistant

## 2016-10-12 ENCOUNTER — Other Ambulatory Visit: Payer: Self-pay | Admitting: Family Medicine

## 2016-10-12 ENCOUNTER — Encounter: Payer: Self-pay | Admitting: Physician Assistant

## 2016-10-12 VITALS — BP 108/70 | HR 80 | Temp 98.5°F

## 2016-10-12 DIAGNOSIS — J0100 Acute maxillary sinusitis, unspecified: Secondary | ICD-10-CM

## 2016-10-12 MED ORDER — AMPHETAMINE-DEXTROAMPHET ER 20 MG PO CP24: 20.0000 mg | ORAL_CAPSULE | Freq: Every day | ORAL | 0 refills | Status: DC

## 2016-10-12 MED ORDER — AZITHROMYCIN 250 MG PO TABS: ORAL_TABLET | ORAL | 0 refills | Status: DC

## 2016-10-12 MED ORDER — FLUCONAZOLE 150 MG PO TABS: 150.0000 mg | ORAL_TABLET | Freq: Once | ORAL | 0 refills | Status: AC

## 2016-10-12 MED ORDER — FLUTICASONE PROPIONATE 50 MCG/ACT NA SUSP: 2.0000 | Freq: Every day | NASAL | 6 refills | Status: DC

## 2016-10-12 NOTE — Telephone Encounter (Signed)
Last filled 08/22/16. Last OV 06/03/16. No scheduled follow up visit.

## 2016-10-12 NOTE — Progress Notes (Signed)
S: C/o sore throat, runny nose and congestion for 7 days, no fever, chills, cp/sob, v/d; mucus is green and thick,  c/o of facial and dental pain with some headache  Using otc meds: dayquil  O: PE: vitals wnl, nad, perrl eomi, normocephalic, tms dull, nasal mucosa red and swollen, throat injected, neck supple no lymph, lungs c t a, cv rrr, neuro intact  A:  Acute sinusitis   P: drink fluids, continue regular meds , use otc meds of choice, return if not improving in 5 days, return earlier if worsening , zpack, flonase, diflucan, saline nasal rinse

## 2016-10-13 NOTE — Telephone Encounter (Signed)
mychart message sent to pt letting her know rx ready.

## 2016-12-12 ENCOUNTER — Ambulatory Visit: Payer: Self-pay | Admitting: Internal Medicine

## 2016-12-15 ENCOUNTER — Ambulatory Visit (INDEPENDENT_AMBULATORY_CARE_PROVIDER_SITE_OTHER): Payer: 59 | Admitting: Internal Medicine

## 2016-12-15 ENCOUNTER — Encounter: Payer: Self-pay | Admitting: Internal Medicine

## 2016-12-15 VITALS — BP 112/72 | HR 89 | Wt 179.0 lb

## 2016-12-15 DIAGNOSIS — E039 Hypothyroidism, unspecified: Secondary | ICD-10-CM

## 2016-12-15 LAB — T4, FREE: Free T4: 1.16 ng/dL (ref 0.60–1.60)

## 2016-12-15 LAB — TSH: TSH: 3.3 u[IU]/mL (ref 0.35–4.50)

## 2016-12-15 NOTE — Progress Notes (Signed)
Patient ID: Olivia Henderson, female   DOB: 11-17-1990, 26 y.o.   MRN: [id number]   HPI  Olivia Henderson is a 26 y.o.-year-old female, initially referred by Dr Guinevere Ferrari, for management of hypothyroidism. She saw Dr Johny Chess in the past. Last visit 1 year ago.   Pt. has been dx with hypothyroidism in 08/2010; she was previously on Armour. She is now on Levothyroxine 50 mcg, taken: - fasting at 5:15 am - with water  - separated by >30 min from b'fast  - no calcium, iron, PPIs - + started multivitamins - in am, at 8:30-9 am  I reviewed pt's thyroid tests - per reviewed records from Epic and from [facility] - last set of labs normal on 50 mcg LT4: Lab Results  Component Value Date   TSH 2.68 01/28/2016   TSH 3.700 12/11/2015   TSH 3.07 11/10/2014   TSH 0.66 07/10/2014   FREET4 0.91 01/28/2016   FREET4 1.34 12/11/2015   FREET4 0.99 11/10/2014   FREET4 1.08 07/10/2014  01/2014: TSH undetectable (reportedly) 11/20/2013: TSH 0.217 07/02/2013: TSH 1.360 06/10/2013: TSH 1.210 08/2010: TSH 5  Component     Latest Ref Rng 07/10/2014  Thyroid Peroxidase Antibody     <9 IU/mL 6   Pt describes: - no fatigue - no weight loss or gain - no cold or heat intolerance - no constipation - no dry skin - no hair falling - no depression/anxiety - + HAs - more frequent in last 2 weeks.  Pt denies feeling nodules in neck, hoarseness, dysphagia/odynophagia, SOB with lying down.  No immediate plans for another pregnancy. Patient delivered a healthy son on 09/19/2013. She was on 88 mcg of levothyroxine before the pregnancy and then decreased to 75 mcg during the pregnancy. She decreased the dose further to 50 mcg in 01/2014, after a TSH checked through her workplace returned undetectable. Of note, she had preeclampsia during the pregnancy.  ROS: Constitutional: see HPI Eyes: no blurry vision, no xerophthalmia ENT: no sore throat, no nodules palpated in throat, no dysphagia/odynophagia, no  hoarseness Cardiovascular: no CP/SOB/palpitations/ leg swelling Respiratory: no cough/SOB Gastrointestinal: no N/V/D/C/acid reflux Musculoskeletal: no muscle/joint aches Skin: no rashes, no hair loss, no dry skin Neurological: no tremors/numbness/tingling/dizziness  I reviewed pt's medications, allergies, PMH, social hx, family hx, and changes were documented in the history of present illness. Otherwise, unchanged from my initial visit note:  Past Medical History:  Diagnosis Date  . Chicken pox   . GERD (gastroesophageal reflux disease)   . Unspecified hypothyroidism 07/10/2014   Past Surgical hx: - Wisdom teeth extraction in 2010 - C section in 08/2013  History   Social History  . 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   Social History Main Topics  . Smoking status: Never Smoker   . Smokeless tobacco: No  . Alcohol Use: No  . Drug Use: No  Exercises by walking 3x a week  Social History Narrative   Engaged   39 month old son   First menstrual cycle: 13 yrs   1 pregnancy   CNA   Current Outpatient Prescriptions  Medication Sig Dispense Refill  . amphetamine-dextroamphetamine (ADDERALL XR) 20 MG 24 hr capsule Take 1 capsule (20 mg total) by mouth daily. 30 capsule 0  . azithromycin (ZITHROMAX Z-PAK) 250 MG tablet 2 pills today then 1 pill a day for 4 days 6 each  0  . fluticasone (FLONASE) 50 MCG/ACT nasal spray Place 2 sprays into both nostrils daily. 16 g 6  . levonorgestrel (MIRENA) 20 MCG/24HR IUD 1 each by Intrauterine route once.    Marland Kitchen. levothyroxine (SYNTHROID, LEVOTHROID) 50 MCG tablet Take 1 tablet (50 mcg total) by mouth daily before breakfast. 90 tablet 0   No current facility-administered medications for this visit.    NKDA  FH: - DM2 in grandfather - heart ds in [place] - cancer in [place]  PE: BP 112/72 (BP Location: Left Arm, Patient Position: Sitting)   Pulse 89   Wt  179 lb (81.2 kg)   SpO2 98%   BMI 31.71 kg/m  Body mass index is 31.71 kg/m. Wt Readings from Last 3 Encounters:  12/15/16 179 lb (81.2 kg)  06/03/16 188 lb 6 oz (85.4 kg)  12/11/15 185 lb (83.9 kg)   Constitutional: overweight, in NAD Eyes: PERRLA, EOMI, no exophthalmos ENT: moist mucous membranes, no thyromegaly, no cervical lymphadenopathy Cardiovascular: RRR, No MRG Respiratory: CTA B Gastrointestinal: abdomen soft, NT, ND, BS+ Musculoskeletal: no deformities, strength intact in all 4 Skin: moist, warm, no rashes Neurological: no tremor with outstretched hands, DTR normal in all 4  ASSESSMENT: 1. Hypothyroidism  PLAN:  1. Patient with long-standing hypothyroidism, on levothyroxine therapy. She appears euthyroid. She lost 9 lbs, while on Adderall. She does not appear to have a goiter, thyroid nodules, or neck compression symptoms. - We discussed about correct intake of levothyroxine, fasting, with water, separated by at least 30 minutes from breakfast, and separated by more than 4 hours from calcium, iron, multivitamins, acid reflux medications (PPIs). She is taking it correctly, but started a MVI which she takes 3-4h after LT4. - will check thyroid tests today: TSH, free T4 today - If these are abnormal, she will need to return in 6-8 weeks for repeat labs - If these are normal, I will see her back in 1 year  - needs a refill of LT4  Component     Latest Ref Rng & Units 12/15/2016  TSH     0.35 - 4.50 uIU/mL 3.30  T4,Free(Direct)     0.60 - 1.60 ng/dL [id number]   Tests are normal. We'll continue the current dose of levothyroxine.  Carlus Pavlovristina Gracey Tolle, MD PhD [facility]

## 2016-12-15 NOTE — Patient Instructions (Signed)
Please stop at the lab.  Take the thyroid hormone every day, with water, at least 30 minutes before breakfast, separated by at least 4 hours from: - acid reflux medications - calcium - iron - multivitamins  Skip any Biotin 5 days before next visit.  Please return in 1 year.

## 2016-12-16 MED ORDER — LEVOTHYROXINE SODIUM 50 MCG PO TABS: 50.0000 ug | ORAL_TABLET | Freq: Every day | ORAL | 3 refills | Status: DC

## 2017-01-19 ENCOUNTER — Other Ambulatory Visit: Payer: Self-pay | Admitting: Family Medicine

## 2017-01-19 NOTE — Telephone Encounter (Signed)
Refilled: 10/12/16 Last OV: 06/03/16 Last Labs: 12/15/16 Future OV: none Please advise?

## 2017-01-20 MED ORDER — AMPHETAMINE-DEXTROAMPHET ER 20 MG PO CP24: 20.0000 mg | ORAL_CAPSULE | Freq: Every day | ORAL | 0 refills | Status: DC

## 2017-01-20 NOTE — Telephone Encounter (Signed)
mychart message sent

## 2017-02-01 ENCOUNTER — Ambulatory Visit: Payer: Self-pay | Admitting: Physician Assistant

## 2017-02-01 ENCOUNTER — Encounter: Payer: Self-pay | Admitting: Physician Assistant

## 2017-02-01 VITALS — BP 110/74 | HR 91 | Temp 98.6°F

## 2017-02-01 DIAGNOSIS — J0100 Acute maxillary sinusitis, unspecified: Secondary | ICD-10-CM

## 2017-02-01 MED ORDER — PREDNISONE 10 MG PO TABS: 30.0000 mg | ORAL_TABLET | Freq: Every day | ORAL | 0 refills | Status: DC

## 2017-02-01 MED ORDER — AZITHROMYCIN 250 MG PO TABS: ORAL_TABLET | ORAL | 0 refills | Status: DC

## 2017-02-01 NOTE — Progress Notes (Signed)
S: C/o runny nose and congestion for 7 days, no fever, chills, cp/sob, v/d; mucus is green and thick, having a lot of facial pressure on r side, some headache, slight cough  Using otc meds: sinus relief, sinus rinse  O: PE: vitals wnl, nad, perrl eomi, normocephalic, tms dull, nasal mucosa red and swollen on r, throat injected, neck supple no lymph, lungs c t a, cv rrr, neuro intact  A:  Acute sinusitis   P: drink fluids, continue regular meds , use otc meds of choice, return if not improving in 5 days, return earlier if worsening , zpack, pred  qd x 3d, use flonase and saline wash

## 2017-05-24 ENCOUNTER — Other Ambulatory Visit: Payer: Self-pay | Admitting: Family Medicine

## 2017-05-25 NOTE — Telephone Encounter (Signed)
Last filled in March, please advise, thanks

## 2017-05-26 MED ORDER — AMPHETAMINE-DEXTROAMPHET ER 20 MG PO CP24: 20.0000 mg | ORAL_CAPSULE | Freq: Every day | ORAL | 0 refills | Status: DC

## 2017-05-26 NOTE — Telephone Encounter (Signed)
Left voice mail script ready to be picked up

## 2017-06-30 ENCOUNTER — Telehealth: Payer: 59 | Admitting: Family

## 2017-06-30 DIAGNOSIS — L237 Allergic contact dermatitis due to plants, except food: Secondary | ICD-10-CM

## 2017-06-30 MED ORDER — PREDNISONE 5 MG PO TABS: 5.0000 mg | ORAL_TABLET | ORAL | 0 refills | Status: DC

## 2017-06-30 NOTE — Progress Notes (Signed)
Thank you for the details you put in the comment boxes. Those details really help us take better care of you.   We are sorry that you are not feeing well.  Here is how we plan to help!  Based on what you have shared with me it looks like you have had an allergic reaction to the oily resin from a group of plants.  This resin is very sticky, so it easily attaches to your skin, clothing, tools equipment, and pet's fur.    This blistering rash is often called poison ivy rash although it can come from contact with the leaves, stems and roots of poison ivy, poison oak and poison sumac.  The oily resin contains urushiol (u-ROO-she-ol) that produces a skin rash on exposed skin.  The severity of the rash depends on the amount of urushiol that gets on your skin.  A section of skin with more urushiol on it may develop a rash sooner.  The rash usually develops 12-48 hours after exposure and can last two to three weeks.  Your skin must come in direct contact with the plant's oil to be affected.  Blister fluid doesn't spread the rash.  However, if you come into contact with a piece of clothing or pet fur that has urushiol on it, the rash may spread out.  You can also transfer the oil to other parts of your body with your fingers.  Often the rash looks like a straight line because of the way the plant brushes against your skin.    I have developed the following plan to treat your condition Since your rash is widespread or has resulted in a large number of blisters, I have prescribed an oral corticosteroid.  Please follow these recommendations:  I have sent a prednisone dose pack to your chosen pharmacy. Be sure to follow the instructions carefully and complete the entire prescription. You may use Benadryl or Caladryl topical lotions to sooth the itch and remember cool, not hot, showers and baths can help relieve the itching!  Place cool, wet compresses on the affected area for 15-30 minutes several times a day.  You  may also take oral antihistamines, such as diphenhydramine (Benadryl, others), which may also help you sleep better.  Watch your skin for any purulent (pus) drainage or red streaking from the site.  If this occurs, contact your provider.  You may require an antibiotic for a skin infection.  Make sure that the clothes you were wearing as well as any towels or sheets that may have come in contact with the oil (urushiol) are washed in detergent and hot water.         What can you do to prevent this rash?  Avoid the plants.  Learn how to identify poison ivy, poison oak and poison sumac in all seasons.  When hiking or engaging in other activities that might expose you to these plants, try to stay on cleared pathways.  If camping, make sure you pitch your tent in an area free of these plants.  Keep pets from running through wooded areas so that urushiol doesn't accidentally stick to their fur, which you may touch.  Remove or kill the plants.  In your yard, you can get rid of poison ivy by applying an herbicide or pulling it out of the ground, including the roots, while wearing heavy gloves.  Afterward remove the gloves and thoroughly wash them and your hands.  Don't burn poison ivy or related plants because   the urushiol can be carried by smoke.  Wear protective clothing.  If needed, protect your skin by wearing socks, boots, pants, long sleeves and vinyl gloves.  Wash your skin right away.  Washing off the oil with soap and water within 30 minutes of exposure may reduce your chances of getting a poison ivy rash.  Even washing after an hour or so can help reduce the severity of the rash.  If you walk through some poison ivy and then later touch your shoes, you may get some urushiol on your hands, which may then transfer to your face or body by touching or rubbing.  If the contaminated object isn't cleaned, the urushiol on it can still cause a skin reaction years later.    Be careful not to reuse towels  after you have washed your skin.  Also carefully wash clothing in detergent and hot water to remove all traces of the oil.  Handle contaminated clothing carefully so you don't transfer the urushiol to yourself, furniture, rugs or appliances.  Remember that pets can carry the oil on their fur and paws.  If you think your pet may be contaminated with urushiol, put on some long rubber gloves and give your pet a bath.  Finally, be careful not to burn these plants as the smoke can contain traces of the oil.  Inhaling the smoke may result in difficulty breathing. If that occurred you should see a physician as soon as possible.  See your doctor right away if:   The reaction is severe or widespread  You inhaled the smoke from burning poison ivy and are having difficulty breathing  Your skin continues to swell  The rash affects your eyes, mouth or genitals  Blisters are oozing pus  You develop a fever greater than 100 F (37.8 C)  The rash doesn't get better within a few weeks.  If you scratch the poison ivy rash, bacteria under your fingernails may cause the skin to become infected.  See your doctor if pus starts oozing from the blisters.  Treatment generally includes antibiotics.  Poison ivy treatments are usually limited to self-care methods.  And the rash typically goes away on its own in two to three weeks.     If the rash is widespread or results in a large number of blisters, your doctor may prescribe an oral corticosteroid, such as prednisone.  If a bacterial infection has developed at the rash site, your doctor may give you a prescription for an oral antibiotic.  MAKE SURE YOU   Understand these instructions.  Will watch your condition.  Will get help right away if you are not doing well or get worse.  Thank you for choosing an e-visit. Your e-visit answers were reviewed by a board certified advanced clinical practitioner to complete your personal care plan. Depending upon the  condition, your plan could have included both over the counter or prescription medications.  Please review your pharmacy choice. If there is a problem you may use MyChart messaging to have the prescription routed to another pharmacy.   Your safety is important to us. If you have drug allergies check your prescription carefully.  You can use MyChart to ask questions about today's visit, request a non-urgent call back, or ask for a work or school excuse for 24 hours related to this e-Visit. If it has been greater than 24 hours you will need to follow up with your provider, or enter a new e-Visit to address those concerns.     You will get an email in the next two days asking about your experience. I hope that your e-visit has been valuable and will speed your recovery Thank you for choosing an e-visit.      

## 2017-09-20 ENCOUNTER — Telehealth: Payer: 59 | Admitting: Nurse Practitioner

## 2017-09-20 DIAGNOSIS — J0100 Acute maxillary sinusitis, unspecified: Secondary | ICD-10-CM

## 2017-09-20 DIAGNOSIS — R059 Cough, unspecified: Secondary | ICD-10-CM

## 2017-09-20 DIAGNOSIS — R05 Cough: Secondary | ICD-10-CM

## 2017-09-20 DIAGNOSIS — J069 Acute upper respiratory infection, unspecified: Secondary | ICD-10-CM

## 2017-09-20 MED ORDER — AZITHROMYCIN 250 MG PO TABS: ORAL_TABLET | ORAL | 0 refills | Status: DC

## 2017-09-20 NOTE — Progress Notes (Signed)

## 2017-09-20 NOTE — Progress Notes (Signed)

## 2017-09-20 NOTE — Progress Notes (Signed)
Patient originally did cough evisit which I was unable to treat other than symptomatic treatment. I spoke with patient on phone and told her that she did not meet criteria for antibiotic for sinusitis based on answers to questions. She also said that zithromax always works best for her. I told her that augmentin and doxycycline were our first 2 lines of treatment. She submitted this sinusitis evisit in which I chose to go ahead and treat her. Did not feel she needed levaquin. went ahead and treated her with zithromax as she requested. She knows that this is not the best antibiotic for sinusitis.

## 2017-10-16 ENCOUNTER — Ambulatory Visit: Payer: Self-pay | Admitting: Emergency Medicine

## 2017-10-16 VITALS — BP 110/70 | HR 121 | Temp 100.5°F | Resp 16

## 2017-10-16 DIAGNOSIS — R509 Fever, unspecified: Secondary | ICD-10-CM

## 2017-10-16 LAB — POCT INFLUENZA A/B
INFLUENZA A, POC: NEGATIVE
INFLUENZA B, POC: NEGATIVE

## 2017-10-16 NOTE — Progress Notes (Signed)
S: This morning with fever and muscle aches.  Patient states that she feels there is a lot of mucus in her throat.  Temperature was 100 when she left work this morning.  When she got home it was 100.7.  She is taking some ibuprofen.  She denies any other symptoms. O: TMs are dull bilaterally.  Nose is minimally boggy.  Throat with minimal injection.  No erythema or exudate noted.  Neck supple without adenopathy.  Lungs are clear bilaterally.  Heart regular rate and rhythm. A: Viral illness. P: Patient will take over-the-counter medication, increase fluids, Tylenol or ibuprofen as needed for fever. Influenza test in the office was negative.

## 2017-10-20 ENCOUNTER — Ambulatory Visit: Payer: Self-pay | Admitting: Nurse Practitioner

## 2017-10-20 VITALS — BP 130/80 | HR 97 | Temp 98.5°F | Resp 16

## 2017-10-20 DIAGNOSIS — J209 Acute bronchitis, unspecified: Secondary | ICD-10-CM

## 2017-10-20 DIAGNOSIS — J069 Acute upper respiratory infection, unspecified: Secondary | ICD-10-CM

## 2017-10-20 MED ORDER — PREDNISONE 10 MG (21) PO TBPK: ORAL_TABLET | ORAL | 0 refills | Status: AC

## 2017-10-20 MED ORDER — AZITHROMYCIN 250 MG PO TABS: ORAL_TABLET | ORAL | 0 refills | Status: AC

## 2017-10-20 MED ORDER — BENZONATATE 100 MG PO CAPS: 100.0000 mg | ORAL_CAPSULE | Freq: Three times a day (TID) | ORAL | 0 refills | Status: AC | PRN

## 2017-10-20 MED ORDER — HYDROCODONE-HOMATROPINE 5-1.5 MG/5ML PO SYRP: 5.0000 mL | ORAL_SOLUTION | Freq: Three times a day (TID) | ORAL | 0 refills | Status: AC | PRN

## 2017-10-20 NOTE — Progress Notes (Signed)
Subjective:     Olivia Henderson is a 26 y.o. female here for evaluation of a cough. Patient was seen on 12/17 for fever, cough and congestion, which has gradually worsened. Onset of symptoms was 5 days ago. Symptoms have been gradually worsening since that time. The cough is productive of green sputum and is aggravated by nothing. Associated symptoms include: fever, postnasal drip and ear pain. Patient does not have a history of asthma. Patient does not have a history of environmental allergens. Patient has not traveled recently. Patient does not have a history of smoking.  Patient stated she was instructed to use Coricidin HBP which help some.  Patient was also tested for influenza, which was negative.  The following portions of the patient's history were reviewed and updated as appropriate: allergies, current medications and past medical history.  Review of Systems Constitutional: positive for fatigue and fevers Eyes: negative Ears, nose, mouth, throat, and face: positive for earaches, nasal congestion and post nasal drip, negative for ear drainage and sore throat Respiratory: positive for cough Cardiovascular: negative Neurological: negative Behavioral/Psych: negative    Objective:     BP 130/80   Pulse 97   Temp 98.5 F (36.9 C) (Oral)   Resp 16   SpO2 97%  General appearance: alert, cooperative and fatigued Head: Normocephalic, without obvious abnormality, atraumatic Eyes: conjunctivae/corneas clear. PERRL, EOM's intact. Fundi benign. Ears: normal TM's and external ear canals both ears Nose: turbinates swollen, inflamed, no sinus tenderness Throat: lips, mucosa, and tongue normal; teeth and gums normal Lungs: clear to auscultation bilaterally Heart: regular rate and rhythm, S1, S2 normal, no murmur, click, rub or gallop Skin: Skin color, texture, turgor normal. No rashes or lesions Lymph nodes: Cervical, supraclavicular, and axillary nodes normal. and cervical nodes  normal    Assessment:    Acute Bronchitis and URI with Post Nasal Drip    Plan:    Antibiotics per medication orders. Antitussives per medication orders. Avoid exposure to tobacco smoke and fumes. Call if shortness of breath worsens, blood in sputum, change in character of cough, development of fever or chills, inability to maintain nutrition and hydration. Avoid exposure to tobacco smoke and fumes. Follow-up in 5 days, or sooner as needed.

## 2017-12-15 ENCOUNTER — Ambulatory Visit: Payer: Self-pay | Admitting: Internal Medicine

## 2018-01-08 ENCOUNTER — Telehealth: Payer: 59 | Admitting: Family

## 2018-01-08 DIAGNOSIS — J00 Acute nasopharyngitis [common cold]: Secondary | ICD-10-CM

## 2018-01-08 MED ORDER — FLUTICASONE PROPIONATE 50 MCG/ACT NA SUSP: 2.0000 | Freq: Every day | NASAL | 6 refills | Status: DC

## 2018-01-08 MED ORDER — ALBUTEROL SULFATE HFA 108 (90 BASE) MCG/ACT IN AERS: 2.0000 | INHALATION_SPRAY | Freq: Four times a day (QID) | RESPIRATORY_TRACT | 2 refills | Status: DC | PRN

## 2018-01-08 NOTE — Addendum Note (Signed)
Addended by: Beau FannyWITHROW, Davin Muramoto C on: 01/08/2018 11:48 AM   Modules accepted: Orders

## 2018-01-08 NOTE — Progress Notes (Signed)
Addendum; Pt returned my phone call and requested albuterol inhaler. Sent prescription. Albuterol HFA, 2 puffs q6h prn shortness of breath.  Constance HawJohn Withrow, DNP, FNP-BC [facility] E[facility] Team

## 2018-01-08 NOTE — Progress Notes (Signed)
Thank you for the details you included in the comment boxes. Those details are very helpful in determining the best course of treatment for you and help [place] to provide the best care. Regarding the codeine cough syrup, this is not something that can be given via electronic visits. See plan below.  We are sorry you are not feeling well.  Here is how we plan to help!  Based on what you have shared with me, it looks like you may have a viral upper respiratory infection or a "common cold".  Colds are caused by a large number of viruses; however, rhinovirus is the most common cause.   Symptoms of the common cold vary from person to person, with common symptoms including sore throat, cough, and malaise.  A low-grade fever of 100.4 may present, but is often uncommon.  Symptoms vary however, and are closely related to a person's age or underlying illnesses.  The most common symptoms associated with the common cold are nasal discharge or congestion, cough, sneezing, headache and pressure in the ears and face.  Cold symptoms usually persist for about 3 to 10 days, but can last up to 2 weeks.  It is important to know that colds do not cause serious illness or complications in most cases.    The common cold is transmitted from person to person, with the most common method of transmission being a person's hands.  The virus is able to live on the skin and can infect other persons for up to 2 hours after direct contact.  Also, colds are transmitted when someone coughs or sneezes; thus, it is important to cover the mouth to reduce this risk.  To keep the spread of the common cold at bay, good hand hygiene is very important.  This is an infection that is most likely caused by a virus. There are no specific treatments for the common cold other than to help you with the symptoms until the infection runs its course.    For nasal congestion, you may use an oral decongestants such as Mucinex D or if you have glaucoma or high  blood pressure use plain Mucinex.  Saline nasal spray or nasal drops can help and can safely be used as often as needed for congestion.  For your congestion, I have prescribed Fluticasone nasal spray one spray in each nostril twice a day  If you do not have a history of heart disease, hypertension, diabetes or thyroid disease, prostate/bladder issues or glaucoma, you may also use Sudafed to treat nasal congestion.  It is highly recommended that you consult with a pharmacist or your primary care physician to ensure this medication is safe for you to take.     If you have a cough, you may use cough suppressants such as Delsym and Robitussin.  If you have glaucoma or high blood pressure, you can also use Coricidin HBP.    If you have a sore or scratchy throat, use a saltwater gargle-  to  teaspoon of salt dissolved in a 4-ounce to 8-ounce glass of warm water.  Gargle the solution for approximately 15-30 seconds and then spit.  It is important not to swallow the solution.  You can also use throat lozenges/cough drops and Chloraseptic spray to help with throat pain or discomfort.  Warm or cold liquids can also be helpful in relieving throat pain.  For headache, pain or general discomfort, you can use Ibuprofen or Tylenol as directed.   Some authorities believe that zinc  sprays or the use of Echinacea may shorten the course of your symptoms.   HOME CARE . Only take medications as instructed by your medical team. . Be sure to drink plenty of fluids. Water is fine as well as fruit juices, sodas and electrolyte beverages. You may want to stay away from caffeine or alcohol. If you are nauseated, try taking small sips of liquids. How do you know if you are getting enough fluid? Your urine should be a pale yellow or almost colorless. . Get rest. . Taking a steamy shower or using a humidifier may help nasal congestion and ease sore throat pain. You can place a towel over your head and breathe in the steam from  hot water coming from a faucet. . Using a saline nasal spray works much the same way. . Cough drops, hard candies and sore throat lozenges may ease your cough. . Avoid close contacts especially the very young and the elderly . Cover your mouth if you cough or sneeze . Always remember to wash your hands.   GET HELP RIGHT AWAY IF: . You develop worsening fever. . If your symptoms do not improve within 10 days . You become short of breath. . You develop yellow or green discharge from your nose over 3 days. . You have coughing fits . You develop a severe head ache or visual changes. . You develop shortness of breath or difficulty breathing. . Your symptoms persist after you have completed your treatment plan  MAKE SURE YOU   Understand these instructions.  Will watch your condition.  Will get help right away if you are not doing well or get worse.  Your e-visit answers were reviewed by a board certified advanced clinical practitioner to complete your personal care plan. Depending upon the condition, your plan could have included both over the counter or prescription medications. Please review your pharmacy choice. If there is a problem, you may call our nursing hot line at and have the prescription routed to another pharmacy. Your safety is important to us. If you have drug allergies check your prescription carefully.   You can use MyChart to ask questions about today's visit, request a non-urgent call back, or ask for a work or school excuse for 24 hours related to this e-Visit. If it has been greater than 24 hours you will need to follow up with your provider, or enter a new e-Visit to address those concerns. You will get an e-mail in the next two days asking about your experience.  I hope that your e-visit has been valuable and will speed your recovery. Thank you for using e-visits.

## 2018-01-11 ENCOUNTER — Encounter: Payer: Self-pay | Admitting: Family Medicine

## 2018-01-11 ENCOUNTER — Ambulatory Visit (INDEPENDENT_AMBULATORY_CARE_PROVIDER_SITE_OTHER): Payer: Self-pay | Admitting: Family Medicine

## 2018-01-11 VITALS — BP 116/74 | HR 92 | Temp 98.5°F | Wt 197.0 lb

## 2018-01-11 DIAGNOSIS — R059 Cough, unspecified: Secondary | ICD-10-CM

## 2018-01-11 DIAGNOSIS — R05 Cough: Secondary | ICD-10-CM

## 2018-01-11 DIAGNOSIS — J069 Acute upper respiratory infection, unspecified: Secondary | ICD-10-CM

## 2018-01-11 MED ORDER — MONTELUKAST SODIUM 10 MG PO TABS: 10.0000 mg | ORAL_TABLET | Freq: Every day | ORAL | 0 refills | Status: DC

## 2018-01-11 NOTE — Patient Instructions (Signed)

## 2018-01-11 NOTE — Progress Notes (Signed)
Olivia Henderson is a 27 y.o. female who presents with a cough and denies any other symptoms. She was recently provided care instructions and medications on an e-visit on 3/11 and reports that these treatment provided only mild relief and the cough still persists.  Review of Systems  Constitutional: Negative for chills, fever and malaise/fatigue.  HENT: Positive for sinus pain. Negative for ear pain and sore throat.   Eyes: Negative.   Respiratory: Positive for cough and sputum production. Negative for shortness of breath.   Cardiovascular: Negative for chest pain and palpitations.  Gastrointestinal: Negative for heartburn, nausea and vomiting.  Genitourinary: Negative for dysuria, frequency and urgency.  Musculoskeletal: Negative for myalgias.  Skin: Negative.   Neurological: Negative.   Endo/Heme/Allergies: Negative.   Psychiatric/Behavioral: Negative.     O: Vitals:   01/11/18 0947  BP: 116/74  Pulse: 92  Temp: 98.5 F (36.9 C)  SpO2: 96%   Physical Exam  Constitutional: She is oriented to person, place, and time. Vital signs are normal. She appears well-developed and well-nourished.  HENT:  Head: Normocephalic.  Right Ear: Hearing, tympanic membrane, external ear and ear canal normal.  Left Ear: Hearing, tympanic membrane, external ear and ear canal normal.  Nose: Rhinorrhea present.  Mouth/Throat: Posterior oropharyngeal erythema present.  Eyes: Pupils are equal, round, and reactive to light.  Neck: Normal range of motion. Neck supple.  Cardiovascular: Normal rate and regular rhythm.  Pulmonary/Chest: Effort normal and breath sounds normal.  Audible dry cough during exam  Abdominal: Soft. Bowel sounds are normal.  Lymphadenopathy:       Head (right side): No submental, no submandibular and no tonsillar adenopathy present.       Head (left side): No submental, no submandibular and no tonsillar adenopathy present.    She has no cervical adenopathy.  Neurological:  She is alert and oriented to person, place, and time.  Skin: Skin is warm and dry.  Vitals reviewed.   A: 1. Cough   2. Viral upper respiratory tract infection     P:  Patient advised to continue albuterol PRN for cough as well as the flonase for nasal congestion and seasonal allergies. Patient agrees with POC and verbalized understanding of information provided. 1. Cough - montelukast (SINGULAIR) 10 MG tablet; Take 1 tablet (10 mg total) by mouth at bedtime.  2. Viral upper respiratory tract infection - montelukast (SINGULAIR) 10 MG tablet; Take 1 tablet (10 mg total) by mouth at bedtime.

## 2018-01-14 DIAGNOSIS — J209 Acute bronchitis, unspecified: Secondary | ICD-10-CM | POA: Diagnosis not present

## 2018-01-16 ENCOUNTER — Telehealth: Payer: Self-pay | Admitting: Emergency Medicine

## 2018-01-16 NOTE — Telephone Encounter (Signed)
Left message follow up call for visit with [facility] provider

## 2018-01-22 ENCOUNTER — Encounter: Payer: Self-pay | Admitting: Family Medicine

## 2018-01-22 ENCOUNTER — Ambulatory Visit: Payer: 59 | Admitting: Family Medicine

## 2018-01-22 VITALS — BP 110/72 | HR 75 | Temp 98.8°F | Ht 63.0 in | Wt 194.4 lb

## 2018-01-22 DIAGNOSIS — J40 Bronchitis, not specified as acute or chronic: Secondary | ICD-10-CM | POA: Diagnosis not present

## 2018-01-22 MED ORDER — PREDNISONE 10 MG PO TABS: ORAL_TABLET | ORAL | 0 refills | Status: DC

## 2018-01-22 MED ORDER — MONTELUKAST SODIUM 10 MG PO TABS: 10.0000 mg | ORAL_TABLET | Freq: Every day | ORAL | 0 refills | Status: DC

## 2018-01-22 NOTE — Progress Notes (Signed)
Patient ID: Olivia Henderson, female   DOB: 09/24/1991, 27 y.o.   MRN: [id number]   PCP: McLean-Scocuzza, Pasty Spillersracy N, MD  Subjective:  Olivia Henderson is a 27 y.o. year old very pleasant female patient who presents with  symptoms including nasal congestion,  cough that is productive with thick/white sputum, chest congestion.  - does report wheezing at night. -started: 2 weeks ago, symptom of cough is persisting -previous treatments: Mucinex, Singulair, azithromycin, steroid injection, and cough suppressant -sick contacts/travel/risks: denies flu exposure. Recent sick contact exposure with young son. Also works as a Lawyer[employer]NA at [employer] No history of asthma She is not a smoker Recently completed azithromycin 3 days ago  She was evaluated on 01/11/18 and provided Singulair which provided benefit at night for cough. She was provided with azithromycin and steroid injection at urgent care which provided moderate benefit however cough and wheezing remain  ROS-denies fever, NVD, tooth pain. Denies significant shortness of breath.   Pertinent Past Medical History- ADD, Hypothyroidism  Patient Active Problem List   Diagnosis Date Noted  . ADD (attention deficit disorder) 06/03/2016  . Hypothyroidism 07/10/2014    Medications- reviewed  Current Outpatient Medications  Medication Sig Dispense Refill  . albuterol (PROVENTIL HFA;VENTOLIN HFA) 108 (90 Base) MCG/ACT inhaler Inhale 2 puffs into the lungs every 6 (six) hours as needed for wheezing or shortness of breath. 1 Inhaler 2  . amphetamine-dextroamphetamine (ADDERALL XR) 20 MG 24 hr capsule Take 1 capsule (20 mg total) by mouth daily. 30 capsule 0  . fluticasone (FLONASE) 50 MCG/ACT nasal spray Place 2 sprays into both nostrils daily. 16 g 6  . levonorgestrel (MIRENA) 20 MCG/24HR IUD 1 each by Intrauterine route once.    Marland Kitchen. levothyroxine (SYNTHROID, LEVOTHROID) 50 MCG tablet Take 1 tablet (50 mcg total) by mouth daily before  breakfast. 90 tablet 3  . montelukast (SINGULAIR) 10 MG tablet Take 1 tablet (10 mg total) by mouth at bedtime. 15 tablet 0   No current facility-administered medications for this visit.     Objective: BP 110/72 (BP Location: Left Arm, Patient Position: Sitting, Cuff Size: Large)   Pulse 75   Temp 98.8 F (37.1 C) (Oral)   Ht 5\' 3"  (1.6 m)   Wt 194 lb 6.4 oz (88.2 kg)   SpO2 96%   BMI 34.44 kg/m  Gen: NAD, resting comfortably HEENT: Turbinates erythematous, TMs normal bilaterally, oropharynx clear and moist, no sinus tenderness CV: RRR no murmurs rubs or gallops Lungs: Minimal scattered wheezing present today, no crackles or rhonchi Ext: no edema Skin: warm, dry, no rash  Assessment/Plan: 1. Bronchitis - montelukast (SINGULAIR) 10 MG tablet; Take 1 tablet (10 mg total) by mouth at bedtime.  Dispense: 30 tablet; Refill: 0 - predniSONE (DELTASONE) 10 MG tablet; Take 4 tablets daily for 4 days, 3 tabs daily for 2 days, 2 tabs daily for 2 days, and one tab daily for 2 days.  Dispense: 28 tablet; Refill: 0  History and exam today are suggestive of viral infection most likely due to bronchitis. Symptomatic treatment with: prednisone taper and a refill of Singulair which has provided benefit. She just recently completed azithromycin and today VSS and minimal scattered wheezing present with O2 sat of 96%. No additional antibiotic will be provided today; will treat conservatively and advised close follow up if symptoms do not improve with treatment, worsen, or she develops new symptoms particularly fever or shortness of breath.  We discussed that we did not find any infection that  had higher probability of being bacterial such as pneumonia, strep throat, ear infection, bacterial sinusitis. We discussed signs that bacterial infection may have developed particularly fever or shortness of breath and reasons for follow up (symptoms worsen, last past expected time frame, new concerns  arise).    Inez Catalina, FNP

## 2018-01-22 NOTE — Patient Instructions (Signed)
It was great seeing you today.  I have sent in a prescription for prednisone. Take 4 tablets daily for 4 days, 3 tabs daily for 2 days, 2 tabs daily for 2 days, and one tab daily for 2 days.   Continue Singulair once daily.  Albuterol inhaler if needed as directed.    During the day, Delsym and/or Mucinex is best for cough     Acute Bronchitis Bronchitis is inflammation of the airways that extend from the windpipe into the lungs (bronchi). The inflammation often causes mucus to develop. This leads to a cough, which is the most common symptom of bronchitis.  In acute bronchitis, the condition usually develops suddenly and goes away over time, usually in a couple weeks. Smoking, allergies, and asthma can make bronchitis worse. Repeated episodes of bronchitis may cause further lung problems.  CAUSES Acute bronchitis is most often caused by the same virus that causes a cold. The virus can spread from person to person (contagious) through coughing, sneezing, and touching contaminated objects. SIGNS AND SYMPTOMS   Cough.   Fever.   Coughing up mucus.   Body aches.   Chest congestion.   Chills.   Shortness of breath.   Sore throat.  DIAGNOSIS  Acute bronchitis is usually diagnosed through a physical exam. Your health care provider will also ask you questions about your medical history. Tests, such as chest X-rays, are sometimes done to rule out other conditions.  TREATMENT  Acute bronchitis usually goes away in a couple weeks. Oftentimes, no medical treatment is necessary. Medicines are sometimes given for relief of fever or cough. Antibiotic medicines are usually not needed but may be prescribed in certain situations. In some cases, an inhaler may be recommended to help reduce shortness of breath and control the cough. A cool mist vaporizer may also be used to help thin bronchial secretions and make it easier to clear the chest.  HOME CARE INSTRUCTIONS  Get plenty of rest.    Drink enough fluids to keep your urine clear or pale yellow (unless you have a medical condition that requires fluid restriction). Increasing fluids may help thin your respiratory secretions (sputum) and reduce chest congestion, and it will prevent dehydration.   Take medicines only as directed by your health care provider.  If you were prescribed an antibiotic medicine, finish it all even if you start to feel better.  Avoid smoking and secondhand smoke. Exposure to cigarette smoke or irritating chemicals will make bronchitis worse. If you are a smoker, consider using nicotine gum or skin patches to help control withdrawal symptoms. Quitting smoking will help your lungs heal faster.   Reduce the chances of another bout of acute bronchitis by washing your hands frequently, avoiding people with cold symptoms, and trying not to touch your hands to your mouth, nose, or eyes.   Keep all follow-up visits as directed by your health care provider.  SEEK MEDICAL CARE IF: Your symptoms do not improve after 1 week of treatment.  SEEK IMMEDIATE MEDICAL CARE IF:  You develop an increased fever or chills.   You have chest pain.   You have severe shortness of breath.  You have bloody sputum.   You develop dehydration.  You faint or repeatedly feel like you are going to pass out.  You develop repeated vomiting.  You develop a severe headache. MAKE SURE YOU:   Understand these instructions.  Will watch your condition.  Will get help right away if you are not doing well  or get worse.   This information is not intended to replace advice given to you by your health care provider. Make sure you discuss any questions you have with your health care provider.   Document Released: 11/24/2004 Document Revised: 11/07/2014 Document Reviewed: 04/09/2013 Elsevier Interactive Patient Education [employer].

## 2018-02-07 ENCOUNTER — Ambulatory Visit (INDEPENDENT_AMBULATORY_CARE_PROVIDER_SITE_OTHER): Payer: 59 | Admitting: Internal Medicine

## 2018-02-07 ENCOUNTER — Encounter: Payer: Self-pay | Admitting: Internal Medicine

## 2018-02-07 ENCOUNTER — Encounter: Payer: Self-pay | Admitting: Family Medicine

## 2018-02-07 VITALS — BP 114/74 | HR 100 | Ht 63.0 in | Wt 198.0 lb

## 2018-02-07 DIAGNOSIS — E039 Hypothyroidism, unspecified: Secondary | ICD-10-CM | POA: Diagnosis not present

## 2018-02-07 LAB — T4, FREE: FREE T4: 0.94 ng/dL (ref 0.60–1.60)

## 2018-02-07 LAB — TSH: TSH: 4 u[IU]/mL (ref 0.35–4.50)

## 2018-02-07 MED ORDER — LEVOTHYROXINE SODIUM 75 MCG PO TABS: 75.0000 ug | ORAL_TABLET | Freq: Every day | ORAL | 5 refills | Status: DC

## 2018-02-07 NOTE — Patient Instructions (Addendum)
Please stop at the lab.  Continue levothyroxine 50 mcg daily.  Take the thyroid hormone every day, with water, at least 30 minutes before breakfast, separated by at least 4 hours from: - acid reflux medications - calcium - iron - multivitamins  Please return in 09/2017 for thyroid labs (after IUD is out).  Please return in 1 year.

## 2018-02-07 NOTE — Progress Notes (Signed)
Patient ID: Olivia Henderson, female   DOB: 1991-03-01, 27 y.o.   MRN: [id number]   HPI  Olivia Henderson is a 27 y.o.-year-old female, initially referred by Dr Guinevere Ferrari, for management of hypothyroidism. She saw Dr Johny Chess in the past. Last visit with me 1 year and 2 months ago.  She had bronchitis >> was on Prednisone long taper >> finished few days ago.  She plans to have her IUD out in 09/2018. After this, she will start trying to get pregnant.  She has been diagnosed with hypothyroidism in 08/2010.  She was previously on Armour, now on levothyroxine 50 mcg daily.  She takes the levothyroxine: - in am - fasting - at least 30 min from b'fast - no Ca, Fe, PPIs, MVI - not on Biotin  Reviewed patient's TFTs: Lab Results  Component Value Date   TSH 3.30 12/15/2016   TSH 2.68 01/28/2016   TSH 3.700 12/11/2015   TSH 3.07 11/10/2014   TSH 0.66 07/10/2014   FREET4 1.16 12/15/2016   FREET4 0.91 01/28/2016   FREET4 1.34 12/11/2015   FREET4 0.99 11/10/2014   FREET4 1.08 07/10/2014  01/2014: TSH undetectable (reportedly) 11/20/2013: TSH 0.217 07/02/2013: TSH 1.360 06/10/2013: TSH 1.210 08/2010: TSH 5  Thyroid antibodies are now positive: Component     Latest Ref Rng 07/10/2014  Thyroid Peroxidase Antibody     <9 IU/mL 6   Patient denies: - Fatigue - Cold intolerance - Constipation - Hair loss  But has weight gain of approximately 15 pounds since last visit.  Pt denies: - feeling nodules in neck - hoarseness - dysphagia - choking - SOB with lying down  Patient delivered a healthy son on 09/19/2013. She was on 28 mcg of levothyroxine before the pregnancy and then decreased to 75 mcg during the pregnancy. She decreased the dose further to 50 mcg in 01/2014, after a TSH checked through her workplace returned undetectable. Of note, she had preeclampsia during the pregnancy.  ROS: Constitutional: + see HPI Eyes: no blurry vision, no xerophthalmia ENT: no  sore throat,  + see HPI.  Cardiovascular: no CP/no SOB/no palpitations/no leg swelling Respiratory: no cough/no SOB/no wheezing Gastrointestinal: no N/no V/no D/no C/no acid reflux Musculoskeletal: no muscle aches/no joint aches Skin: no rashes, no hair loss Neurological: no tremors/no numbness/no tingling/no dizziness  I reviewed pt's medications, allergies, PMH, social hx, family hx, and changes were documented in the history of present illness. Otherwise, unchanged from my initial visit note.  Past Medical History:  Diagnosis Date  . Chicken pox   . GERD (gastroesophageal reflux disease)   . Unspecified hypothyroidism 07/10/2014   Past Surgical hx: - Wisdom teeth extraction in 2010 - C section in 08/2013  History   Social History  . Marital Status: Single    Spouse Name: N/A    Number of Children: 1   Occupational History  . 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   Social History Main Topics  . Smoking status: Never Smoker   . Smokeless tobacco: No  . Alcohol Use: No  . Drug Use: No  Exercises by walking 3x a week  Social History Narrative   Engaged   39 month old son   First menstrual cycle: 13 yrs   1 pregnancy   CNA   Current Outpatient Medications  Medication Sig Dispense Refill  . albuterol (PROVENTIL HFA;VENTOLIN HFA) 108 (90 Base) MCG/ACT inhaler Inhale 2 puffs into the lungs every 6 (six) hours as needed for wheezing or shortness  of breath. 1 Inhaler 2  . amphetamine-dextroamphetamine (ADDERALL XR) 20 MG 24 hr capsule Take 1 capsule (20 mg total) by mouth daily. 30 capsule 0  . fluticasone (FLONASE) 50 MCG/ACT nasal spray Place 2 sprays into both nostrils daily. 16 g 6  . levonorgestrel (MIRENA) 20 MCG/24HR IUD 1 each by Intrauterine route once.    Marland Kitchen. levothyroxine (SYNTHROID, LEVOTHROID) 50 MCG tablet Take 1 tablet (50 mcg total) by mouth daily before breakfast. 90 tablet 3  . montelukast (SINGULAIR) 10 MG tablet Take 1 tablet (10 mg total) by mouth at bedtime. 30  tablet 0  . predniSONE (DELTASONE) 10 MG tablet Take 4 tablets daily for 4 days, 3 tabs daily for 2 days, 2 tabs daily for 2 days, and one tab daily for 2 days. 28 tablet 0   No current facility-administered medications for this visit.    NKDA  FH: - DM2 in grandfather - heart ds in GM - cancer in GM  PE: BP 114/74   Pulse 100   Ht 5\' 3"  (1.6 m)   Wt 198 lb (89.8 kg)   SpO2 97%   BMI 35.07 kg/m  Body mass index is 35.07 kg/m. Wt Readings from Last 3 Encounters:  02/07/18 198 lb (89.8 kg)  01/22/18 194 lb 6.4 oz (88.2 kg)  01/11/18 197 lb (89.4 kg)   Constitutional: overweight, in NAD Eyes: PERRLA, EOMI, no exophthalmos ENT: moist mucous membranes, no thyromegaly, no cervical lymphadenopathy Cardiovascular: tachycardia, RR, No MRG Respiratory: CTA B Gastrointestinal: abdomen soft, NT, ND, BS+ Musculoskeletal: no deformities, strength intact in all 4 Skin: moist, warm, no rashes Neurological: no tremor with outstretched hands, DTR normal in all 4  ASSESSMENT: 1. Hypothyroidism  PLAN:  1.  Patient with long-standing hypothyroidism, with no evidence of autoimmunity, with good control. - latest thyroid labs reviewed with pt >> normal in 12/2016.   - she continues on LT4 50 Mcg daily  - pt feels good on this dose, but gained approximately 15 pounds since last visit. - we discussed about taking the thyroid hormone every day, with water, >30 minutes before breakfast, separated by >4 hours from acid reflux medications, calcium, iron, multivitamins. Pt. is taking it correctly. - will check thyroid tests today (she finished her steroid taper 8 days ago): TSH and fT4 - If labs are abnormal, she will need to return for repeat TFTs in 1.5 months - she will have her IUD out in 09/2018, then not use protection, trying for a pregnancy >> advised her to come back after her IUD is out to make sure her TSH is <2.5 - we also discussed about changes in the LT4 dose during the pregnancy >>  we most likely need to increase her dose to 75 mcg daily then.  - time spent with the patient: 15 min, of which >50% was spent in obtaining information about her symptoms, reviewing her previous labs, evaluations, and treatments, counseling her about her condition (please see the discussed topics above), and developing a plan to further investigate and tx it.  Office Visit on 02/07/2018  Component Date Value Ref Range Status  . TSH 02/07/2018 4.00  0.35 - 4.50 uIU/mL Final  . Free T4 02/07/2018 0.94  0.60 - 1.60 ng/dL Final   Comment: Specimens from patients who are undergoing biotin therapy and /or ingesting biotin supplements may contain high levels of biotin.  The higher biotin concentration in these specimens interferes with this Free T4 assay.  Specimens that contain high levels  of biotin may cause false high results for this Free T4 assay.  Please interpret results in light of the total clinical presentation of the patient.     Thyroid tests are normal, but TSH is at the ULN >> will increase the LT4 dose to 75 mcg daily and recheck TFTs in 1.5 months.  Carlus Pavlov, MD PhD [facility] Endocrin[facility]

## 2018-03-23 ENCOUNTER — Encounter: Payer: Self-pay | Admitting: Internal Medicine

## 2018-03-28 ENCOUNTER — Other Ambulatory Visit
Admission: RE | Admit: 2018-03-28 | Discharge: 2018-03-28 | Disposition: A | Discharge status: Home / Self Care | Payer: 59 | Source: Ambulatory Visit | Attending: Internal Medicine | Admitting: Internal Medicine

## 2018-03-28 DIAGNOSIS — E039 Hypothyroidism, unspecified: Secondary | ICD-10-CM | POA: Diagnosis not present

## 2018-03-28 LAB — T4, FREE: FREE T4: 1.19 ng/dL (ref 0.82–1.77)

## 2018-03-28 LAB — TSH: TSH: 2.313 u[IU]/mL (ref 0.350–4.500)

## 2018-03-29 ENCOUNTER — Other Ambulatory Visit: Payer: Self-pay | Admitting: Internal Medicine

## 2018-03-29 MED ORDER — LEVOTHYROXINE SODIUM 75 MCG PO TABS: 75.0000 ug | ORAL_TABLET | Freq: Every day | ORAL | 3 refills | Status: DC

## 2018-07-25 ENCOUNTER — Ambulatory Visit: Payer: 59 | Admitting: Family Medicine

## 2018-07-25 ENCOUNTER — Encounter: Payer: Self-pay | Admitting: Family Medicine

## 2018-07-25 VITALS — BP 122/82 | HR 99 | Temp 100.7°F | Ht 63.0 in | Wt 187.0 lb

## 2018-07-25 DIAGNOSIS — R0981 Nasal congestion: Secondary | ICD-10-CM

## 2018-07-25 DIAGNOSIS — R0982 Postnasal drip: Secondary | ICD-10-CM

## 2018-07-25 DIAGNOSIS — J0100 Acute maxillary sinusitis, unspecified: Secondary | ICD-10-CM | POA: Diagnosis not present

## 2018-07-25 DIAGNOSIS — R070 Pain in throat: Secondary | ICD-10-CM | POA: Diagnosis not present

## 2018-07-25 MED ORDER — DOXYCYCLINE HYCLATE 100 MG PO TABS: 100.0000 mg | ORAL_TABLET | Freq: Two times a day (BID) | ORAL | 0 refills | Status: DC

## 2018-07-25 MED ORDER — PREDNISONE 10 MG PO TABS: 10.0000 mg | ORAL_TABLET | Freq: Every day | ORAL | 0 refills | Status: AC

## 2018-07-25 NOTE — Progress Notes (Signed)
   Subjective:    Patient ID: Olivia Henderson, female    DOB: 10-19-1991, 27 y.o.   MRN: [id number]  HPI  Patient presents to clinic due to sore throat, thick yellow nasal drainage, nasal congestion, dry cough for almost 2 weeks.  Did take some Mucinex and Claritin with mild reduction in symptoms, but all symptoms returned.  States she feels drained due to head feeling so full.  Denies any fever or chills.  Patient Active Problem List   Diagnosis Date Noted  . ADD (attention deficit disorder) 06/03/2016  . Hypothyroidism 07/10/2014   Social History   Tobacco Use  . Smoking status: Never Smoker  . Smokeless tobacco: Never Used  Substance Use Topics  . Alcohol use: No   Review of Systems  Constitutional: Negative for chills, fatigue and fever.  HENT: positive for congestion, ear pain, sinus pain and sore throat.   Eyes: Negative.   Respiratory: +dry cough. Negative for shortness of breath and wheezing.   Cardiovascular: Negative for chest pain, palpitations and leg swelling.  Gastrointestinal: Negative for abdominal pain, diarrhea, nausea and vomiting.  Genitourinary: Negative for dysuria, frequency and urgency.  Musculoskeletal: Negative for arthralgias and myalgias.  Skin: Negative for color change, pallor and rash.  Neurological: Negative for syncope, light-headedness and headaches.  Psychiatric/Behavioral: The patient is not nervous/anxious.       Objective:   Physical Exam  Constitutional: She is oriented to person, place, and time. She appears well-nourished. No distress.  Temp 100.7 in clinic  HENT:  Head: Normocephalic and atraumatic.  +maxillary sinus tenderness. +yellow nasal discharge. +post nasal drip.  Eyes: Right eye exhibits no discharge. Left eye exhibits no discharge. No scleral icterus.  Neck: Neck supple. No tracheal deviation present.  +gland tenderness  Cardiovascular: Normal rate and regular rhythm.  Pulmonary/Chest: Effort normal and breath  sounds normal. No respiratory distress. She has no wheezes. She has no rales.  Musculoskeletal: She exhibits no edema.  Neurological: She is alert and oriented to person, place, and time.  Skin: Skin is warm and dry. She is not diaphoretic. No pallor.  Psychiatric: She has a normal mood and affect. Her behavior is normal.  Vitals reviewed.      Vitals:   07/25/18 0918  BP: 122/82  Pulse: 99  Temp: (!) 100.7 F (38.2 C)  SpO2: 97%   Assessment & Plan:    Sinusitis - patient will take doxycycline twice daily for 10 days.  We will also do prednisone burst due to nasal inflammation and congestion.  Throat pain/nasal congestion/postnasal drip - rapid strep negative in clinic.  I still suspect throat pain is related to postnasal drip.  Patient will continue to take her Singulair and use nasal sprays as already prescribed.  Also advised she may continue to use Mucinex over-the-counter as this can help the cough and help thin secretions.  Increase fluids, rest, do good handwashing, may take Tylenol and/or Motrin as needed for fever/pain.

## 2018-07-28 LAB — UPPER RESPIRATORY CULTURE, ROUTINE

## 2018-10-20 ENCOUNTER — Telehealth: Payer: 59 | Admitting: Physician Assistant

## 2018-10-20 DIAGNOSIS — L709 Acne, unspecified: Secondary | ICD-10-CM | POA: Diagnosis not present

## 2018-10-20 MED ORDER — DOXYCYCLINE HYCLATE 100 MG PO CAPS: 100.0000 mg | ORAL_CAPSULE | Freq: Two times a day (BID) | ORAL | 0 refills | Status: DC

## 2018-10-20 NOTE — Progress Notes (Signed)
We are sorry that you are experiencing this issue.  Here is how we plan to help!  Based on what you shared with me it looks like you have cystic acne.  Acne is a disorder of the hair follicles and oil glands (sebaceous glands). The sebaceous glands secrete oils to keep the skin moist.  When the glands get clogged, it can lead to pimples or cysts.  These cysts may become infected and leave scars. Acne is very common and normally occurs at puberty.  Acne is also inherited.  Your personal care plan consists of the following recommendations:  I recommend that you use a daily cleanser  You may try a topical exfoliator and salicylic acid scrub.  These scrubs have coarse particles that clear your pores but may also irritate your skin.    I have also prescribed one of the following additional therapies:  Doxycycline an oral antibiotic 100 mg twice a day  If excessive dryness or peeling occurs, reduce dose frequency or concentration of the topical scrubs.  If excessive stinging or burning occurs, remove the topical gel with mild soap and water and resume at a lower dose the next day.  Remember oral antibiotics and topical acne treatments may increase your sensitivity to the sun!  HOME CARE:  Do not squeeze pimples because that can often lead to infections, worse acne, and scars.  Use a moisturizer that contains retinoid or fruit acids that may inhibit the development of new acne lesions.  Although there is not a clear link that foods can cause acne, doctors do believe that too many sweets predispose you to skin problems.  GET HELP RIGHT AWAY IF:  If your acne gets worse or is not better within 10 days.  If you become depressed.  If you become pregnant, discontinue medications and call your OB/GYN.  MAKE SURE YOU:  Understand these instructions.  Will watch your condition.  Will get help right away if you are not doing well or get worse.   Your e-visit answers were reviewed by a  board certified advanced clinical practitioner to complete your personal care plan.  Depending upon the condition, your plan could have included both over the counter or prescription medications.  Please review your pharmacy choice.  If there is a problem, you may contact your provider through MyChart messaging and have the prescription routed to another pharmacy.  Your safety is important to us.  If you have drug allergies check your prescription carefully.  For the next 24 hours you can use MyChart to ask questions about today's visit, request a non-urgent call back, or ask for a work or school excuse from your e-visit provider.  You will get an email in the next two days asking about your experience. I hope that your e-visit has been valuable and will speed your recovery.  

## 2018-10-31 NOTE — L&D Delivery Note (Signed)
Operative Delivery Note At 8:04 AM a viable female was delivered via Vaginal, Spontaneous.  Presentation: vertex; Position: Occiput,, Anterior; Station: +1. "Emery" APGAR: 8, 9; weight pending .   Placenta status: spontaneous delivery .   Cord:  with the following complications: none  Anesthesia: Epidural  Instruments: Flat Kiwi Episiotomy:  None Lacerations:  2nd degree Suture Repair: 2.0 vicryl Est. Blood Loss (mL):  1000 mL Pitocin and Cytotec administered  PREOPERATIVE DIAGNOSES: 1. Term pregnancy at [redacted]wks Gestational Age 60. Maternal Exhaustion    POSTOPERATIVE DIAGNOSES: 1. Term pregnancy at [redacted]wks Gestational Age 60. Live, Viable female infant 3. Maternal Exhaustion    OPERATION PERFORMED: Vacuum-Assisted Vaginal Delivery   SURGEON: Barnett Applebaum, MD   ANESTHESIA: Epidural   ESTIMATED BLOOD LOSS: 4967 cc   COMPLICATIONS: None   SITUATION:  The options for labor management at his time were discussed with the patient and her partner, as were the delivery options including a Kiwi vacuum delivery. The pros and cons and the risks of the vacuum delivery were discussed in detail, as were the alternative approaches. The patient and her partner decided to proceed with a Kiwi vacuum delivery.    DESCRIPTION OF THE PROCEDURE:    The baby's head was confirmed to be in the OA presentation with 100% effacement and +1 station. The bladder was drained. The vacuum was placed and the correct placement in front of the posterior fontanelle was confirmed digitally. With the patient's next contraction, the vacuum was inflated and a gentle pressure was used to assist with maternal pushes to deliver the baby's head. In total there were 3pulls with one ctx and 1 pop-offs.  After delivery of the head, the vacuum was deflated and removed. There was not  a nuchal cord.  The anterior  shoulder was delivered with gentle downward guidance followed by delivery of the posterior  shoulder with gentle upward  guidance. The infant was placed on the maternal chest.  The cord was clamped x2 and cut. The infant was handed to the NICU attendants.   Pitocin was added to the patient's IV fluids. The placenta delivered spontaneously, was intact and had a three-vessel cord. A vaginal inspection revealed 2nd degree perineal lacetation. The laceration  was repaired with #2-0Vicryl suture in a normal fashion with local anesthesia.    At the end of the delivery mom and baby were recovering in stable condition.  Sponge, instrument, and needle counts were correct times two.   Barnett Applebaum, MD, [facility], [facility] 09/12/2019  8:31 AM

## 2018-11-17 ENCOUNTER — Telehealth: Payer: 59 | Admitting: Family

## 2018-11-17 DIAGNOSIS — R05 Cough: Secondary | ICD-10-CM | POA: Diagnosis not present

## 2018-11-17 DIAGNOSIS — R059 Cough, unspecified: Secondary | ICD-10-CM

## 2018-11-17 MED ORDER — BENZONATATE 100 MG PO CAPS: 100.0000 mg | ORAL_CAPSULE | Freq: Three times a day (TID) | ORAL | 0 refills | Status: DC | PRN

## 2018-11-17 NOTE — Progress Notes (Signed)

## 2018-11-18 DIAGNOSIS — J069 Acute upper respiratory infection, unspecified: Secondary | ICD-10-CM | POA: Diagnosis not present

## 2018-11-27 ENCOUNTER — Ambulatory Visit (INDEPENDENT_AMBULATORY_CARE_PROVIDER_SITE_OTHER): Payer: 59 | Admitting: Obstetrics & Gynecology

## 2018-11-27 ENCOUNTER — Other Ambulatory Visit (HOSPITAL_COMMUNITY)
Admission: RE | Admit: 2018-11-27 | Discharge: 2018-11-27 | Disposition: A | Discharge status: Home / Self Care | Payer: 59 | Source: Ambulatory Visit | Attending: Obstetrics & Gynecology | Admitting: Obstetrics & Gynecology

## 2018-11-27 ENCOUNTER — Encounter: Payer: Self-pay | Admitting: Obstetrics & Gynecology

## 2018-11-27 VITALS — BP 130/80 | Ht 62.0 in | Wt 190.0 lb

## 2018-11-27 DIAGNOSIS — Z01419 Encounter for gynecological examination (general) (routine) without abnormal findings: Secondary | ICD-10-CM | POA: Diagnosis not present

## 2018-11-27 DIAGNOSIS — Z30432 Encounter for removal of intrauterine contraceptive device: Secondary | ICD-10-CM

## 2018-11-27 DIAGNOSIS — Z124 Encounter for screening for malignant neoplasm of cervix: Secondary | ICD-10-CM | POA: Diagnosis not present

## 2018-11-27 NOTE — Progress Notes (Signed)
HPI:      Olivia Henderson is a 28 y.o. G1P1001 who LMP was Patient's last menstrual period was 08/31/2018., she presents today for her annual examination. The patient has no complaints today. The patient is sexually active. Her last pap: was normal. The patient does perform self breast exams.  There is no notable family history of breast or ovarian cancer in her family.  The patient has regular exercise: yes.  The patient denies current symptoms of depression.    GYN History: Contraception: IUD  PMHx: Past Medical History:  Diagnosis Date  . Chicken pox   . GERD (gastroesophageal reflux disease)   . Unspecified hypothyroidism 07/10/2014   Past Surgical History:  Procedure Laterality Date  . CESAREAN SECTION  2014   Family History  Problem Relation Age of Onset  . Kidney cancer Maternal Grandmother   . Diabetes Maternal Grandfather    Social History   Tobacco Use  . Smoking status: Never Smoker  . Smokeless tobacco: Never Used  Substance Use Topics  . Alcohol use: No  . Drug use: No    Current Outpatient Medications:  .  levonorgestrel (MIRENA) 20 MCG/24HR IUD, 1 each by Intrauterine route once., Disp: , Rfl:  .  levothyroxine (SYNTHROID, LEVOTHROID) 75 MCG tablet, Take 1 tablet (75 mcg total) by mouth daily before breakfast., Disp: 90 tablet, Rfl: 3 .  montelukast (SINGULAIR) 10 MG tablet, Take 1 tablet (10 mg total) by mouth at bedtime. (Patient not taking: Reported on 11/27/2018), Disp: 30 tablet, Rfl: 0 Allergies: Patient has no known allergies.  Review of Systems  Constitutional: Negative for chills, fever and malaise/fatigue.  HENT: Negative for congestion, sinus pain and sore throat.   Eyes: Negative for blurred vision and pain.  Respiratory: Negative for cough and wheezing.   Cardiovascular: Negative for chest pain and leg swelling.  Gastrointestinal: Negative for abdominal pain, constipation, diarrhea, heartburn, nausea and vomiting.  Genitourinary:  Negative for dysuria, frequency, hematuria and urgency.  Musculoskeletal: Negative for back pain, joint pain, myalgias and neck pain.  Skin: Negative for itching and rash.  Neurological: Negative for dizziness, tremors and weakness.  Endo/Heme/Allergies: Does not bruise/bleed easily.  Psychiatric/Behavioral: Negative for depression. The patient is not nervous/anxious and does not have insomnia.     Objective: BP 130/80   Ht 5\' 2"  (1.575 m)   Wt 190 lb (86.2 kg)   LMP 08/31/2018   BMI 34.75 kg/m   Filed Weights   11/27/18 0959  Weight: 190 lb (86.2 kg)   Body mass index is 34.75 kg/m. Physical Exam Constitutional:      General: She is not in acute distress.    Appearance: She is well-developed.  Genitourinary:     Pelvic exam was performed with patient supine.     Vagina, uterus and rectum normal.     No lesions in the vagina.     No vaginal bleeding.     No cervical motion tenderness, friability, lesion or polyp.     Uterus is mobile.     Uterus is not enlarged.     No uterine mass detected.    Uterus is midaxial.     No right or left adnexal mass present.     Right adnexa not tender.     Left adnexa not tender.  HENT:     Head: Normocephalic and atraumatic. No laceration.     Right Ear: Hearing normal.     Left Ear: Hearing normal.  Mouth/Throat:     Pharynx: Uvula midline.  Eyes:     Pupils: Pupils are equal, round, and reactive to light.  Neck:     Musculoskeletal: Normal range of motion and neck supple.     Thyroid: No thyromegaly.  Cardiovascular:     Rate and Rhythm: Normal rate and regular rhythm.     Heart sounds: No murmur. No friction rub. No gallop.   Pulmonary:     Effort: Pulmonary effort is normal. No respiratory distress.     Breath sounds: Normal breath sounds. No wheezing.  Chest:     Breasts:        Right: No mass, skin change or tenderness.        Left: No mass, skin change or tenderness.  Abdominal:     General: Bowel sounds are  normal. There is no distension.     Palpations: Abdomen is soft.     Tenderness: There is no abdominal tenderness. There is no rebound.  Musculoskeletal: Normal range of motion.  Neurological:     Mental Status: She is alert and oriented to person, place, and time.     Cranial Nerves: No cranial nerve deficit.  Skin:    General: Skin is warm and dry.  Psychiatric:        Judgment: Judgment normal.  Vitals signs reviewed.     Assessment:  ANNUAL EXAM 1. Women's annual routine gynecological examination   2. Screening for cervical cancer   3. Encounter for IUD removal    Screening Plan:            1.  Cervical Screening-  Pap smear done today  2.  Labs managed by PCP  3. Counseling for contraception: IUD out today; preconceptual counseling  Other:  3. Encounter for IUD removal Pelvic exam:  Two IUD strings present seen coming from the cervical os. EGBUS, vaginal vault and cervix: within normal limits  IUD Removal Strings of IUD identified and grasped.  IUD removed without problem.  Pt tolerated this well.  IUD noted to be intact.  Assessment: IUD Removal  Plan: IUD removed and plan for contraception is no method, desires pregnancy. She was amenable to this plan.     F/U  Return in about 1 year (around 11/28/2019) for Annual.  [name], MD, [facility], [facility] 11/27/2018  10:26 AM

## 2018-11-27 NOTE — Patient Instructions (Signed)
Preparing for Pregnancy  If you are considering becoming pregnant, make an appointment to see your regular health care provider to learn how to prepare for a safe and healthy pregnancy (preconception care). During a preconception care visit, your health care provider will:  · Do a complete physical exam, including a Pap test.  · Take a complete medical history.  · Give you information, answer your questions, and help you resolve problems.  Preconception checklist  Medical history  · Tell your health care provider about any current or past medical conditions. Your pregnancy or your ability to become pregnant may be affected by chronic conditions, such as diabetes, chronic hypertension, and thyroid problems.  · Include your family's medical history as well as your partner's medical history.  · Tell your health care provider about any history of STIs (sexually transmitted infections). These can affect your pregnancy. In some cases, they can be passed to your baby. Discuss any concerns that you have about STIs.  · If indicated, discuss the benefits of genetic testing. This testing will show whether there are any genetic conditions that may be passed from you or your partner to your baby.  · Tell your health care provider about:  ? Any problems you have had with conception or pregnancy.  ? Any medicines you take. These include vitamins, herbal supplements, and over-the-counter medicines.  ? Your history of immunizations. Discuss any vaccinations that you may need.  Diet  · Ask your health care provider what to include in a healthy diet that has a balance of nutrients. This is especially important when you are pregnant or preparing to become pregnant.  · Ask your health care provider to help you reach a healthy weight before pregnancy.  ? If you are overweight, you may be at higher risk for certain complications, such as high blood pressure, diabetes, and preterm birth.  ? If you are underweight, you are more likely to  have a baby who has a low birth weight.  Lifestyle, work, and home  · Let your health care provider know:  ? About any lifestyle habits that you have, such as alcohol use, drug use, or smoking.  ? About recreational activities that may put you at risk during pregnancy, such as downhill skiing and certain exercise programs.  ? Tell your health care provider about any international travel, especially any travel to places with an active Zika virus outbreak.  ? About harmful substances that you may be exposed to at work or at home. These include chemicals, pesticides, radiation, or even litter boxes.  ? If you do not feel safe at home.  Mental health  · Tell your health care provider about:  ? Any history of mental health conditions, including feelings of depression, sadness, or anxiety.  ? Any medicines that you take for a mental health condition. These include herbs and supplements.  Home instructions to prepare for pregnancy  Lifestyle    · Eat a balanced diet. This includes fresh fruits and vegetables, whole grains, lean meats, low-fat dairy products, healthy fats, and foods that are high in fiber. Ask to meet with a nutritionist or registered dietitian for assistance with meal planning and goals.  · Get regular exercise. Try to be active for at least 30 minutes a day on most days of the week. Ask your health care provider which activities are safe during pregnancy.  · Do not use any products that contain nicotine or tobacco, such as cigarettes and e-cigarettes. If you need   help quitting, ask your health care provider.  · Do not drink alcohol.  · Do not take illegal drugs.  · Maintain a healthy weight. Ask your health care provider what weight range is right for you.  General instructions  · Keep an accurate record of your menstrual periods. This makes it easier for your health care provider to determine your baby's due date.  · Begin taking prenatal vitamins and folic acid supplements daily as directed by your  health care provider.  · Manage any chronic conditions, such as high blood pressure and diabetes, as told by your health care provider. This is important.  How do I know that I am pregnant?  You may be pregnant if you have been sexually active and you miss your period. Symptoms of early pregnancy include:  · Mild cramping.  · Very light vaginal bleeding (spotting).  · Feeling unusually tired.  · Nausea and vomiting (morning sickness).  If you have any of these symptoms and you suspect that you might be pregnant, you can take a home pregnancy test. These tests check for a hormone in your urine (human chorionic gonadotropin, or hCG). A woman's body begins to make this hormone during early pregnancy. These tests are very accurate. Wait until at least the first day after you miss your period to take one. If the test shows that you are pregnant (you get a positive result), call your health care provider to make an appointment for prenatal care.  What should I do if I become pregnant?         · Make an appointment with your health care provider as soon as you suspect you are pregnant.  · Do not use any products that contain nicotine, such as cigarettes, chewing tobacco, and e-cigarettes. If you need help quitting, ask your health care provider.  · Do not drink alcoholic beverages. Alcohol is related to a number of birth defects.  · Avoid toxic odors and chemicals.  · You may continue to have sexual intercourse if it does not cause pain or other problems, such as vaginal bleeding.  This information is not intended to replace advice given to you by your health care provider. Make sure you discuss any questions you have with your health care provider.  Document Released: 09/29/2008 Document Revised: 10/19/2017 Document Reviewed: 05/08/2016  Elsevier Interactive Patient Education © 2019 [employer] Inc[employer]

## 2018-11-30 LAB — CYTOLOGY - PAP: DIAGNOSIS: NEGATIVE

## 2019-01-04 ENCOUNTER — Encounter: Payer: Self-pay | Admitting: Internal Medicine

## 2019-01-04 ENCOUNTER — Ambulatory Visit: Payer: 59 | Admitting: Internal Medicine

## 2019-01-04 VITALS — BP 112/64 | HR 94 | Temp 98.4°F | Ht 62.0 in | Wt 192.4 lb

## 2019-01-04 DIAGNOSIS — E559 Vitamin D deficiency, unspecified: Secondary | ICD-10-CM | POA: Diagnosis not present

## 2019-01-04 DIAGNOSIS — Z1159 Encounter for screening for other viral diseases: Secondary | ICD-10-CM | POA: Diagnosis not present

## 2019-01-04 DIAGNOSIS — E611 Iron deficiency: Secondary | ICD-10-CM | POA: Diagnosis not present

## 2019-01-04 DIAGNOSIS — E039 Hypothyroidism, unspecified: Secondary | ICD-10-CM | POA: Diagnosis not present

## 2019-01-04 DIAGNOSIS — D649 Anemia, unspecified: Secondary | ICD-10-CM

## 2019-01-04 DIAGNOSIS — J069 Acute upper respiratory infection, unspecified: Secondary | ICD-10-CM

## 2019-01-04 DIAGNOSIS — Z0184 Encounter for antibody response examination: Secondary | ICD-10-CM

## 2019-01-04 DIAGNOSIS — Z0000 Encounter for general adult medical examination without abnormal findings: Secondary | ICD-10-CM | POA: Diagnosis not present

## 2019-01-04 DIAGNOSIS — Z1389 Encounter for screening for other disorder: Secondary | ICD-10-CM | POA: Diagnosis not present

## 2019-01-04 NOTE — Patient Instructions (Addendum)
Delsym  1/2 dose zyrtec  Tea with honey and lemon  Ginger, onions, garlic   Last Tdap vaccine    Upper Respiratory Infection, Adult An upper respiratory infection (URI) is a common viral infection of the nose, throat, and upper air passages that lead to the lungs. The most common type of URI is the common cold. URIs usually get better on their own, without medical treatment. What are the causes? A URI is caused by a virus. You may catch a virus by:  Breathing in droplets from an infected person's cough or sneeze.  Touching something that has been exposed to the virus (contaminated) and then touching your mouth, nose, or eyes. What increases the risk? You are more likely to get a URI if:  You are very young or very old.  It is autumn or winter.  You have close contact with others, such as at a daycare, school, or health care facility.  You smoke.  You have long-term (chronic) heart or lung disease.  You have a weakened disease-fighting (immune) system.  You have nasal allergies or asthma.  You are experiencing a lot of stress.  You work in an area that has poor air circulation.  You have poor nutrition. What are the signs or symptoms? A URI usually involves some of the following symptoms:  Runny or stuffy (congested) nose.  Sneezing.  Cough.  Sore throat.  Headache.  Fatigue.  Fever.  Loss of appetite.  Pain in your forehead, behind your eyes, and over your cheekbones (sinus pain).  Muscle aches.  Redness or irritation of the eyes.  Pressure in the ears or face. How is this diagnosed? This condition may be diagnosed based on your medical history and symptoms, and a physical exam. Your health care provider may use a cotton swab to take a mucus sample from your nose (nasal swab). This sample can be tested to determine what virus is causing the illness. How is this treated? URIs usually get better on their own within 7-10 days. You can take steps at home  to relieve your symptoms. Medicines cannot cure URIs, but your health care provider may recommend certain medicines to help relieve symptoms, such as:  Over-the-counter cold medicines.  Cough suppressants. Coughing is a type of defense against infection that helps to clear the respiratory system, so take these medicines only as recommended by your health care provider.  Fever-reducing medicines. Follow these instructions at home: Activity  Rest as needed.  If you have a fever, stay home from work or school until your fever is gone or until your health care provider says you are no longer contagious. Your health care provider may have you wear a face mask to prevent your infection from spreading. Relieving symptoms  Gargle with a salt-water mixture 3-4 times a day or as needed. To make a salt-water mixture, completely dissolve -1 tsp of salt in 1 cup of warm water.  Use a cool-mist humidifier to add moisture to the air. This can help you breathe more easily. Eating and drinking   Drink enough fluid to keep your urine pale yellow.  Eat soups and other clear broths. General instructions   Take over-the-counter and prescription medicines only as told by your health care provider. These include cold medicines, fever reducers, and cough suppressants.  Do not use any products that contain nicotine or tobacco, such as cigarettes and e-cigarettes. If you need help quitting, ask your health care provider.  Stay away from secondhand smoke.  Stay up to date on all immunizations, including the yearly (annual) flu vaccine.  Keep all follow-up visits as told by your health care provider. This is important. How to prevent the spread of infection to others   URIs can be passed from person to person (are contagious). To prevent the infection from spreading: ? Wash your hands often with soap and water. If soap and water are not available, use hand sanitizer. ? Avoid touching your mouth, face,  eyes, or nose. ? Cough or sneeze into a tissue or your sleeve or elbow instead of into your hand or into the air. Contact a health care provider if:  You are getting worse instead of better.  You have a fever or chills.  Your mucus is brown or red.  You have yellow or brown discharge coming from your nose.  You have pain in your face, especially when you bend forward.  You have swollen neck glands.  You have pain while swallowing.  You have white areas in the back of your throat. Get help right away if:  You have shortness of breath that gets worse.  You have severe or persistent: ? Headache. ? Ear pain. ? Sinus pain. ? Chest pain.  You have chronic lung disease along with any of the following: ? Wheezing. ? Prolonged cough. ? Coughing up blood. ? A change in your usual mucus.  You have a stiff neck.  You have changes in your: ? Vision. ? Hearing. ? Thinking. ? Mood. Summary  An upper respiratory infection (URI) is a common infection of the nose, throat, and upper air passages that lead to the lungs.  A URI is caused by a virus.  URIs usually get better on their own within 7-10 days.  Medicines cannot cure URIs, but your health care provider may recommend certain medicines to help relieve symptoms. This information is not intended to replace advice given to you by your health care provider. Make sure you discuss any questions you have with your health care provider. Document Released: 04/12/2001 Document Revised: 06/02/2017 Document Reviewed: 06/02/2017 Elsevier Interactive Patient Education  2019 [employer].

## 2019-01-04 NOTE — Progress Notes (Signed)
Chief Complaint  Patient presents with  . Cough   F/u with son [facility]  1. C/o cough, pnd throwing up phelgm sick since 12/31/2018 tried netty pot and had old doxycycline before she found out she pregnant son not sick no fever  Ob/gyn appt 01/14/2019   Review of Systems  Constitutional: Negative for weight loss.  HENT: Negative for hearing loss and sore throat.        +PND  Eyes: Negative for blurred vision.  Respiratory: Negative for shortness of breath.   Cardiovascular: Negative for chest pain.  Gastrointestinal: Positive for nausea and vomiting. Negative for abdominal pain.  Skin: Negative for rash.  Neurological: Negative for headaches.  Psychiatric/Behavioral: Negative for depression.   Past Medical History:  Diagnosis Date  . Chicken pox   . GERD (gastroesophageal reflux disease)   . Unspecified hypothyroidism 07/10/2014   Past Surgical History:  Procedure Laterality Date  . CESAREAN SECTION  2014   Family History  Problem Relation Age of Onset  . Kidney cancer Maternal Grandmother   . Diabetes Maternal Grandfather    Social History   Socioeconomic History  . Marital status: Married    Spouse name: Not on file  . Number of children: Not on file  . Years of education: Not on file  . Highest education level: Not on file  Occupational History  . Not on file  Social Needs  . Financial resource strain: Not on file  . Food insecurity:    Worry: Not on file    Inability: Not on file  . Transportation needs:    Medical: Not on file    Non-medical: Not on file  Tobacco Use  . Smoking status: Never Smoker  . Smokeless tobacco: Never Used  Substance and Sexual Activity  . Alcohol use: No  . Drug use: No  . Sexual activity: Yes    Birth control/protection: I.U.D.  Lifestyle  . Physical activity:    Days per week: Not on file    Minutes per session: Not on file  . Stress: Not on file  Relationships  . Social connections:    Talks on phone: Not on file   Gets together: Not on file    Attends religious service: Not on file    Active member of club or organization: Not on file    Attends meetings of clubs or organizations: Not on file    Relationship status: Not on file  . Intimate partner violence:    Fear of current or ex partner: Not on file    Emotionally abused: Not on file    Physically abused: Not on file    Forced sexual activity: Not on file  Other Topics Concern  . Not on file  Social History Narrative   Engaged   30 month old son   First menstrual cycle: 13 yrs   1 pregnancy   CNA         Current Meds  Medication Sig  . levothyroxine (SYNTHROID, LEVOTHROID) 75 MCG tablet Take 1 tablet (75 mcg total) by mouth daily before breakfast.   No Known Allergies Recent Results (from the past 2160 hour(s))  Cytology - PAP     Status: None   Collection Time: 11/27/18 12:00 AM  Result Value Ref Range   Adequacy      Satisfactory for evaluation  endocervical/transformation zone component PRESENT.   Diagnosis      NEGATIVE FOR INTRAEPITHELIAL LESIONS OR MALIGNANCY.   Material Submitted CervicoVaginal Pap [ThinPrep  Imaged]    CYTOLOGY - PAP PAP RESULT    Objective  Body mass index is 35.19 kg/m. Wt Readings from Last 3 Encounters:  01/04/19 192 lb 6.4 oz (87.3 kg)  11/27/18 190 lb (86.2 kg)  07/25/18 187 lb (84.8 kg)   Temp Readings from Last 3 Encounters:  01/04/19 98.4 F (36.9 C) (Oral)  07/25/18 (!) 100.7 F (38.2 C) (Oral)  01/22/18 98.8 F (37.1 C) (Oral)   BP Readings from Last 3 Encounters:  01/04/19 112/64  11/27/18 130/80  07/25/18 122/82   Pulse Readings from Last 3 Encounters:  01/04/19 94  07/25/18 99  02/07/18 100    Physical Exam Vitals signs and nursing note reviewed.  Constitutional:      Appearance: Normal appearance. She is well-developed and well-groomed.  HENT:     Head: Normocephalic and atraumatic.     Nose: Nose normal.     Mouth/Throat:     Mouth: Mucous membranes are moist.      Pharynx: Oropharynx is clear. No posterior oropharyngeal erythema.  Eyes:     Conjunctiva/sclera: Conjunctivae normal.     Pupils: Pupils are equal, round, and reactive to light.  Cardiovascular:     Rate and Rhythm: Normal rate and regular rhythm.     Heart sounds: Normal heart sounds. No murmur.  Pulmonary:     Effort: Pulmonary effort is normal.     Breath sounds: Normal breath sounds.  Skin:    General: Skin is warm and dry.  Neurological:     General: No focal deficit present.     Mental Status: She is alert and oriented to person, place, and time. Mental status is at baseline.     Gait: Gait normal.  Psychiatric:        Attention and Perception: Attention and perception normal.        Mood and Affect: Mood and affect normal.        Speech: Speech normal.        Behavior: Behavior normal. Behavior is cooperative.        Thought Content: Thought content normal.        Cognition and Memory: Cognition and memory normal.        Judgment: Judgment normal.     Assessment   1. URI in pregnancy  2. HM Plan   1.  Delsym, netty pot, cough drops, tea with honey and lemon 2.  Flu shot utd  Tdap   Pap had 10/2018 with IUD out  sch labs will need lipid in future  Ob/gyn appt 01/14/2019  Provider: Dr. French Ana McLean-Scocuzza-[facility]

## 2019-01-04 NOTE — Progress Notes (Signed)
Pre visit review using our clinic review tool, if applicable. No additional management support is needed unless otherwise documented below in the visit note. 

## 2019-01-07 ENCOUNTER — Telehealth: Payer: Self-pay

## 2019-01-07 NOTE — Telephone Encounter (Signed)
Pt c/o very bad sinus inf.  PCP wasn't comfortable prescribing anything.  What to do?  Hasn't started pnc yet.  Has appt scheduled for the 16th.  She's about a month GA.  [phone]  Adv plain robitussin, Hall's cough drops, plain sudafed, warm salt water gargles, saline nasal spray, sip hot beverages/soups, e.s. tylenol, push fluids and rest.  If fever over 100.5 to be seen.

## 2019-01-14 ENCOUNTER — Ambulatory Visit (INDEPENDENT_AMBULATORY_CARE_PROVIDER_SITE_OTHER): Payer: 59 | Admitting: Obstetrics & Gynecology

## 2019-01-14 ENCOUNTER — Other Ambulatory Visit: Payer: Self-pay | Admitting: Obstetrics & Gynecology

## 2019-01-14 ENCOUNTER — Other Ambulatory Visit: Payer: Self-pay

## 2019-01-14 ENCOUNTER — Encounter: Payer: Self-pay | Admitting: Obstetrics & Gynecology

## 2019-01-14 VITALS — BP 120/80

## 2019-01-14 DIAGNOSIS — Z369 Encounter for antenatal screening, unspecified: Secondary | ICD-10-CM | POA: Diagnosis not present

## 2019-01-14 DIAGNOSIS — Z3A01 Less than 8 weeks gestation of pregnancy: Secondary | ICD-10-CM | POA: Diagnosis not present

## 2019-01-14 DIAGNOSIS — N926 Irregular menstruation, unspecified: Secondary | ICD-10-CM

## 2019-01-14 DIAGNOSIS — Z3201 Encounter for pregnancy test, result positive: Secondary | ICD-10-CM | POA: Diagnosis not present

## 2019-01-14 DIAGNOSIS — Z3401 Encounter for supervision of normal first pregnancy, first trimester: Secondary | ICD-10-CM

## 2019-01-14 LAB — POCT URINALYSIS DIPSTICK OB
Glucose, UA: NEGATIVE
POC,PROTEIN,UA: NEGATIVE

## 2019-01-14 LAB — POCT URINE PREGNANCY: PREG TEST UR: POSITIVE — AB

## 2019-01-14 NOTE — Patient Instructions (Signed)
First Trimester of Pregnancy  The first trimester of pregnancy is from week 1 until the end of week 13 (months 1 through 3). A week after a sperm fertilizes an egg, the egg will implant on the wall of the uterus. This embryo will begin to develop into a baby. Genes from you and your partner will form the baby. The female genes will determine whether the baby will be a boy or a girl. At 6-8 weeks, the eyes and face will be formed, and the heartbeat can be seen on ultrasound. At the end of 12 weeks, all the baby's organs will be formed.  Now that you are pregnant, you will want to do everything you can to have a healthy baby. Two of the most important things are to get good prenatal care and to follow your health care provider's instructions. Prenatal care is all the medical care you receive before the baby's birth. This care will help prevent, find, and treat any problems during the pregnancy and childbirth.  Body changes during your first trimester  Your body goes through many changes during pregnancy. The changes vary from woman to woman.   You may gain or lose a couple of pounds at first.   You may feel sick to your stomach (nauseous) and you may throw up (vomit). If the vomiting is uncontrollable, call your health care provider.   You may tire easily.   You may develop headaches that can be relieved by medicines. All medicines should be approved by your health care provider.   You may urinate more often. Painful urination may mean you have a bladder infection.   You may develop heartburn as a result of your pregnancy.   You may develop constipation because certain hormones are causing the muscles that push stool through your intestines to slow down.   You may develop hemorrhoids or swollen veins (varicose veins).   Your breasts may begin to grow larger and become tender. Your nipples may stick out more, and the tissue that surrounds them (areola) may become darker.   Your gums may bleed and may be  sensitive to brushing and flossing.   Dark spots or blotches (chloasma, mask of pregnancy) may develop on your face. This will likely fade after the baby is born.   Your menstrual periods will stop.   You may have a loss of appetite.   You may develop cravings for certain kinds of food.   You may have changes in your emotions from day to day, such as being excited to be pregnant or being concerned that something may go wrong with the pregnancy and baby.   You may have more vivid and strange dreams.   You may have changes in your hair. These can include thickening of your hair, rapid growth, and changes in texture. Some women also have hair loss during or after pregnancy, or hair that feels dry or thin. Your hair will most likely return to normal after your baby is born.  What to expect at prenatal visits  During a routine prenatal visit:   You will be weighed to make sure you and the baby are growing normally.   Your blood pressure will be taken.   Your abdomen will be measured to track your baby's growth.   The fetal heartbeat will be listened to between weeks 10 and 14 of your pregnancy.   Test results from any previous visits will be discussed.  Your health care provider may ask you:     How you are feeling.   If you are feeling the baby move.   If you have had any abnormal symptoms, such as leaking fluid, bleeding, severe headaches, or abdominal cramping.   If you are using any tobacco products, including cigarettes, chewing tobacco, and electronic cigarettes.   If you have any questions.  Other tests that may be performed during your first trimester include:   Blood tests to find your blood type and to check for the presence of any previous infections. The tests will also be used to check for low iron levels (anemia) and protein on red blood cells (Rh antibodies). Depending on your risk factors, or if you previously had diabetes during pregnancy, you may have tests to check for high blood sugar  that affects pregnant women (gestational diabetes).   Urine tests to check for infections, diabetes, or protein in the urine.   An ultrasound to confirm the proper growth and development of the baby.   Fetal screens for spinal cord problems (spina bifida) and Down syndrome.   HIV (human immunodeficiency virus) testing. Routine prenatal testing includes screening for HIV, unless you choose not to have this test.   You may need other tests to make sure you and the baby are doing well.  Follow these instructions at home:  Medicines   Follow your health care provider's instructions regarding medicine use. Specific medicines may be either safe or unsafe to take during pregnancy.   Take a prenatal vitamin that contains at least 600 micrograms (mcg) of folic acid.   If you develop constipation, try taking a stool softener if your health care provider approves.  Eating and drinking     Eat a balanced diet that includes fresh fruits and vegetables, whole grains, good sources of protein such as meat, eggs, or tofu, and low-fat dairy. Your health care provider will help you determine the amount of weight gain that is right for you.   Avoid raw meat and uncooked cheese. These carry germs that can cause birth defects in the baby.   Eating four or five small meals rather than three large meals a day may help relieve nausea and vomiting. If you start to feel nauseous, eating a few soda crackers can be helpful. Drinking liquids between meals, instead of during meals, also seems to help ease nausea and vomiting.   Limit foods that are high in fat and processed sugars, such as fried and sweet foods.   To prevent constipation:  ? Eat foods that are high in fiber, such as fresh fruits and vegetables, whole grains, and beans.  ? Drink enough fluid to keep your urine clear or pale yellow.  Activity   Exercise only as directed by your health care provider. Most women can continue their usual exercise routine during  pregnancy. Try to exercise for 30 minutes at least 5 days a week. Exercising will help you:  ? Control your weight.  ? Stay in shape.  ? Be prepared for labor and delivery.   Experiencing pain or cramping in the lower abdomen or lower back is a good sign that you should stop exercising. Check with your health care provider before continuing with normal exercises.   Try to avoid standing for long periods of time. Move your legs often if you must stand in one place for a long time.   Avoid heavy lifting.   Wear low-heeled shoes and practice good posture.   You may continue to have sex unless your health care   provider tells you not to.  Relieving pain and discomfort   Wear a good support bra to relieve breast tenderness.   Take warm sitz baths to soothe any pain or discomfort caused by hemorrhoids. Use hemorrhoid cream if your health care provider approves.   Rest with your legs elevated if you have leg cramps or low back pain.   If you develop varicose veins in your legs, wear support hose. Elevate your feet for 15 minutes, 3-4 times a day. Limit salt in your diet.  Prenatal care   Schedule your prenatal visits by the twelfth week of pregnancy. They are usually scheduled monthly at first, then more often in the last 2 months before delivery.   Write down your questions. Take them to your prenatal visits.   Keep all your prenatal visits as told by your health care provider. This is important.  Safety   Wear your seat belt at all times when driving.   Make a list of emergency phone numbers, including numbers for family, friends, the hospital, and police and fire departments.  General instructions   Ask your health care provider for a referral to a local prenatal education class. Begin classes no later than the beginning of month 6 of your pregnancy.   Ask for help if you have counseling or nutritional needs during pregnancy. Your health care provider can offer advice or refer you to specialists for help  with various needs.   Do not use hot tubs, steam rooms, or saunas.   Do not douche or use tampons or scented sanitary pads.   Do not cross your legs for long periods of time.   Avoid cat litter boxes and soil used by cats. These carry germs that can cause birth defects in the baby and possibly loss of the fetus by miscarriage or stillbirth.   Avoid all smoking, herbs, alcohol, and medicines not prescribed by your health care provider. Chemicals in these products affect the formation and growth of the baby.   Do not use any products that contain nicotine or tobacco, such as cigarettes and e-cigarettes. If you need help quitting, ask your health care provider. You may receive counseling support and other resources to help you quit.   Schedule a dentist appointment. At home, brush your teeth with a soft toothbrush and be gentle when you floss.  Contact a health care provider if:   You have dizziness.   You have mild pelvic cramps, pelvic pressure, or nagging pain in the abdominal area.   You have persistent nausea, vomiting, or diarrhea.   You have a bad smelling vaginal discharge.   You have pain when you urinate.   You notice increased swelling in your face, hands, legs, or ankles.   You are exposed to fifth disease or chickenpox.   You are exposed to German measles (rubella) and have never had it.  Get help right away if:   You have a fever.   You are leaking fluid from your vagina.   You have spotting or bleeding from your vagina.   You have severe abdominal cramping or pain.   You have rapid weight gain or loss.   You vomit blood or material that looks like coffee grounds.   You develop a severe headache.   You have shortness of breath.   You have any kind of trauma, such as from a fall or a car accident.  Summary   The first trimester of pregnancy is from week 1 until   the end of week 13 (months 1 through 3).   Your body goes through many changes during pregnancy. The changes vary from  woman to woman.   You will have routine prenatal visits. During those visits, your health care provider will examine you, discuss any test results you may have, and talk with you about how you are feeling.  This information is not intended to replace advice given to you by your health care provider. Make sure you discuss any questions you have with your health care provider.  Document Released: 10/11/2001 Document Revised: 09/28/2016 Document Reviewed: 09/28/2016  Elsevier Interactive Patient Education  2019 [employer]

## 2019-01-14 NOTE — Progress Notes (Signed)
01/14/2019   Chief Complaint: Missed period  Transfer of Care Patient: no  History of Present Illness: Olivia Henderson is a 28 y.o. G2P1001 [redacted]w[redacted]d based on Patient's last menstrual period was 12/01/2018 (approximate). with an Estimated Date of Delivery: 09/07/19, with the above CC.   Her periods were: irregular periods as had IUD removed 10/3018, and just one period since removal 12/01/18 She was using no method when she conceived.  She has Positive signs or symptoms of nausea/vomiting of pregnancy. She has Negative signs or symptoms of miscarriage or preterm labor She identifies Negative Zika risk factors for her and her partner On any different medications around the time she conceived/early pregnancy: Yes (Thyroid) History of varicella: Yes   ROS: A 12-point review of systems was performed and negative, except as stated in the above HPI.  OBGYN History: As per HPI. OB History  Gravida Para Term Preterm AB Living  2 1 1     1   SAB TAB Ectopic Multiple Live Births          1    # Outcome Date GA Lbr Len/2nd Weight Sex Delivery Anes PTL Lv  2 Current           1 Term 09/26/13   8 lb 12.8 oz (3.992 kg) M CS-LVertical  N LIV    Any issues with any prior pregnancies: yes- Preeclampsia(Mild) lef to IOL then CS for FTP at 10cm Any prior children are healthy, doing well, without any problems or issues: yes History of pap smears: Yes. Last pap smear 10/2018. Abnormal: no  History of STIs: No   Past Medical History: Past Medical History:  Diagnosis Date  . Anemia   . Chicken pox   . GERD (gastroesophageal reflux disease)   . Unspecified hypothyroidism 07/10/2014    Past Surgical History: Past Surgical History:  Procedure Laterality Date  . CESAREAN SECTION  2014    Family History:  Family History  Problem Relation Age of Onset  . Kidney cancer Maternal Grandmother   . Heart disease Maternal Grandmother        CHF  . Diabetes Maternal Grandfather    She denies any female  cancers, bleeding or blood clotting disorders.  She denies any history of mental retardation, birth defects or genetic disorders in her or the FOB's history  Social History:  Social History   Socioeconomic History  . Marital status: Married    Spouse name: Not on file  . Number of children: Not on file  . Years of education: Not on file  . Highest education level: Not on file  Occupational History  . Not on file  Social Needs  . Financial resource strain: Not on file  . Food insecurity:    Worry: Not on file    Inability: Not on file  . Transportation needs:    Medical: Not on file    Non-medical: Not on file  Tobacco Use  . Smoking status: Never Smoker  . Smokeless tobacco: Never Used  Substance and Sexual Activity  . Alcohol use: No  . Drug use: No  . Sexual activity: Yes    Birth control/protection: None  Lifestyle  . Physical activity:    Days per week: Not on file    Minutes per session: Not on file  . Stress: Not on file  Relationships  . Social connections:    Talks on phone: Not on file    Gets together: Not on file    Attends religious  service: Not on file    Active member of club or organization: Not on file    Attends meetings of clubs or organizations: Not on file    Relationship status: Not on file  . Intimate partner violence:    Fear of current or ex partner: Not on file    Emotionally abused: Not on file    Physically abused: Not on file    Forced sexual activity: Not on file  Other Topics Concern  . Not on file  Social History Narrative   Married    First menstrual cycle: 14 yrs   1 son 69 y.o    Lab [facility]       Any pets in the household: no  Allergy: No Known Allergies  Current Outpatient Medications:  Current Outpatient Medications:  .  levothyroxine (SYNTHROID, LEVOTHROID) 75 MCG tablet, Take 1 tablet (75 mcg total) by mouth daily before breakfast., Disp: 90 tablet, Rfl: 3   Physical Exam:   BP 120/80   LMP 12/01/2018  (Approximate)  There is no height or weight on file to calculate BMI. Constitutional: Well nourished, well developed female in no acute distress.  Neck:  Supple, normal appearance, and no thyromegaly  Cardiovascular: S1, S2 normal, no murmur, rub or gallop, regular rate and rhythm Respiratory:  Clear to auscultation bilateral. Normal respiratory effort Abdomen: positive bowel sounds and no masses, hernias; diffusely non tender to palpation, non distended Breasts: breasts appear normal, no suspicious masses, no skin or nipple changes or axillary nodes. Neuro/Psych:  Normal mood and affect.  Skin:  Warm and dry.  Lymphatic:  No inguinal lymphadenopathy.   Pelvic exam: is minimally limited by body habitus EGBUS: within normal limits, Vagina: within normal limits and with no blood in the vault, Cervix: normal appearing cervix without discharge or lesions, closed/long/high, Uterus:  enlarged: slight, and Adnexa:  normal adnexa  Assessment: Olivia Henderson is a 28 y.o. G2P1001 [redacted]w[redacted]d based on Patient's last menstrual period was 12/01/2018 (approximate). with an Estimated Date of Delivery: 09/07/19,  for prenatal care.  Plan:  1) Avoid alcoholic beverages. 2) Patient encouraged not to smoke.  3) Discontinue the use of all non-medicinal drugs and chemicals.  4) Take prenatal vitamins daily.  5) Seatbelt use advised 6) Nutrition, food safety (fish, cheese advisories, and high nitrite foods) and exercise discussed. 7) Hospital and practice style delivering at [facility] discussed  8) Patient is asked about travel to areas at risk for the Zika virus, and counseled to avoid travel and exposure to mosquitoes or sexual partners who may have themselves been exposed to the virus. Testing is discussed, and will be ordered as appropriate.  9) Childbirth classes at [facility] advised 10) Genetic Screening, such as with 1st Trimester Screening, cell free fetal DNA, AFP testing, and Ultrasound, as well as with amniocentesis and  [facility] as appropriate, is discussed with patient. She plans to have genetic testing this pregnancy. 11) [place] soon 12) TSH monitoring 13) VBAC counseling, info provided  Problem list reviewed and updated.  [name], MD, [facility], [facility] 01/14/2019  2:04 PM

## 2019-01-14 NOTE — Addendum Note (Signed)
Addended by: Nadara Mustard on: 01/14/2019 04:29 PM   Modules accepted: Orders

## 2019-01-15 ENCOUNTER — Other Ambulatory Visit: Payer: Self-pay | Admitting: Obstetrics & Gynecology

## 2019-01-15 ENCOUNTER — Telehealth: Payer: Self-pay | Admitting: Obstetrics & Gynecology

## 2019-01-15 DIAGNOSIS — E039 Hypothyroidism, unspecified: Secondary | ICD-10-CM

## 2019-01-15 LAB — CMP AND LIVER
ALT: 21 IU/L (ref 0–32)
AST: 19 IU/L (ref 0–40)
Albumin: 4.6 g/dL (ref 3.9–5.0)
Alkaline Phosphatase: 91 IU/L (ref 39–117)
BILIRUBIN TOTAL: 0.5 mg/dL (ref 0.0–1.2)
BUN: 9 mg/dL (ref 6–20)
Bilirubin, Direct: 0.15 mg/dL (ref 0.00–0.40)
CHLORIDE: 103 mmol/L (ref 96–106)
CO2: 18 mmol/L — ABNORMAL LOW (ref 20–29)
Calcium: 9.5 mg/dL (ref 8.7–10.2)
Creatinine, Ser: 0.72 mg/dL (ref 0.57–1.00)
GFR calc Af Amer: 132 mL/min/{1.73_m2} (ref >59)
GFR calc non Af Amer: 114 mL/min/{1.73_m2} (ref >59)
Glucose: 114 mg/dL — ABNORMAL HIGH (ref 65–99)
POTASSIUM: 3.6 mmol/L (ref 3.5–5.2)
Sodium: 138 mmol/L (ref 134–144)
Total Protein: 6.8 g/dL (ref 6.0–8.5)

## 2019-01-15 LAB — TSH: TSH: 4.85 u[IU]/mL — ABNORMAL HIGH (ref 0.450–4.500)

## 2019-01-15 LAB — RPR+RH+ABO+RUB AB+AB SCR+CB...
Antibody Screen: NEGATIVE
HIV Screen 4th Generation wRfx: NONREACTIVE
Hematocrit: 37.4 % (ref 34.0–46.6)
Hemoglobin: 13.8 g/dL (ref 11.1–15.9)
Hepatitis B Surface Ag: NEGATIVE
MCH: 34.5 pg — ABNORMAL HIGH (ref 26.6–33.0)
MCHC: 36.9 g/dL — AB (ref 31.5–35.7)
MCV: 94 fL (ref 79–97)
Platelets: 192 10*3/uL (ref 150–450)
RBC: 4 x10E6/uL (ref 3.77–5.28)
RDW: 11.9 % (ref 11.7–15.4)
RPR Ser Ql: NONREACTIVE
Rh Factor: POSITIVE
Rubella Antibodies, IGG: 1.66 index (ref >0.99)
Varicella zoster IgG: 857 index (ref >165)
WBC: 8.4 10*3/uL (ref 3.4–10.8)

## 2019-01-15 LAB — VITAMIN D 25 HYDROXY (VIT D DEFICIENCY, FRACTURES): Vit D, 25-Hydroxy: 22.4 ng/mL — ABNORMAL LOW (ref 30.0–100.0)

## 2019-01-15 MED ORDER — LEVOTHYROXINE SODIUM 100 MCG PO TABS: 100.0000 ug | ORAL_TABLET | Freq: Every day | ORAL | 11 refills | Status: DC

## 2019-01-15 NOTE — Telephone Encounter (Signed)
Patient is calling due to missed call. Please advise Patient is requesting to be called and this number due to being at work. [phone] Direct line today for patient

## 2019-01-16 LAB — GC/CHLAMYDIA PROBE AMP
Chlamydia trachomatis, NAA: NEGATIVE
Neisseria gonorrhoeae by PCR: NEGATIVE

## 2019-01-21 ENCOUNTER — Other Ambulatory Visit: Payer: Self-pay

## 2019-01-21 ENCOUNTER — Ambulatory Visit (INDEPENDENT_AMBULATORY_CARE_PROVIDER_SITE_OTHER): Payer: 59

## 2019-01-21 DIAGNOSIS — Z3A01 Less than 8 weeks gestation of pregnancy: Secondary | ICD-10-CM | POA: Diagnosis not present

## 2019-01-21 DIAGNOSIS — N8312 Corpus luteum cyst of left ovary: Secondary | ICD-10-CM

## 2019-01-21 DIAGNOSIS — O3481 Maternal care for other abnormalities of pelvic organs, first trimester: Secondary | ICD-10-CM | POA: Diagnosis not present

## 2019-01-21 DIAGNOSIS — N926 Irregular menstruation, unspecified: Secondary | ICD-10-CM

## 2019-01-22 ENCOUNTER — Encounter: Payer: Self-pay | Admitting: Obstetrics & Gynecology

## 2019-01-22 ENCOUNTER — Other Ambulatory Visit: Payer: Self-pay

## 2019-01-22 ENCOUNTER — Ambulatory Visit (INDEPENDENT_AMBULATORY_CARE_PROVIDER_SITE_OTHER): Payer: 59 | Admitting: Obstetrics & Gynecology

## 2019-01-22 ENCOUNTER — Ambulatory Visit: Payer: 59

## 2019-01-22 ENCOUNTER — Telehealth: Payer: Self-pay | Admitting: Obstetrics & Gynecology

## 2019-01-22 DIAGNOSIS — O0990 Supervision of high risk pregnancy, unspecified, unspecified trimester: Secondary | ICD-10-CM | POA: Insufficient documentation

## 2019-01-22 DIAGNOSIS — E039 Hypothyroidism, unspecified: Secondary | ICD-10-CM

## 2019-01-22 DIAGNOSIS — Z3A01 Less than 8 weeks gestation of pregnancy: Secondary | ICD-10-CM

## 2019-01-22 DIAGNOSIS — O34219 Maternal care for unspecified type scar from previous cesarean delivery: Secondary | ICD-10-CM | POA: Insufficient documentation

## 2019-01-22 DIAGNOSIS — O99281 Endocrine, nutritional and metabolic diseases complicating pregnancy, first trimester: Secondary | ICD-10-CM

## 2019-01-22 NOTE — Telephone Encounter (Signed)
-----   Message from Nadara Mustard, MD sent at 01/22/2019  3:39 PM EDT ----- Regarding: Sch ROB in 4 weeks

## 2019-01-22 NOTE — Progress Notes (Signed)
Virtual Visit via Telephone Note  I connected with [name] on 01/22/19 at  3:30 PM EDT by telephone and verified that I am speaking with the correct person using two identifiers.   I discussed the limitations, risks, security and privacy concerns of performing an evaluation and management service by telephone and the availability of in person appointments. I also discussed with the patient that there may be a patient responsible charge related to this service. The patient expressed understanding and agreed to proceed.  She was at home and I was in my office.  History of Present Illness: Pt is [redacted] weeks pregnant with ultrasound yesterday and she reports no pain or bleeding or nausea. Has made recent thyroid medicine dose adjustments.   Observations/Objective:  US Ob Transvaginal  Result Date: 01/22/2019 Patient Name: Olivia Henderson DOB: 1991-08-21 MRN: [id number] ULTRASOUND REPORT Location: [facility] Date of Service: 01/21/2019 Indications:dating Findings: Mason Jim intrauterine pregnancy is visualized with a CRL consistent with [redacted]w[redacted]d gestation, giving an (U/S) EDD of 09/11/2019. The (U/S) EDD is consistent with the clinically established EDD of 09/11/2019. FHR: 137 BPM CRL measurement: 78 mm Yolk sac is visualized and appears normal and early anatomy is normal. Amnion: visualized and appears normal Right Ovary is normal in appearance. Left Ovary is normal appearance. Corpus luteal cyst:  Left ovary Survey of the adnexa demonstrates no adnexal masses. There is no free peritoneal fluid in the cul de sac. Impression: 1. [redacted]w[redacted]d Viable Singleton Intrauterine pregnancy by U/S. 2. (U/S) EDD is consistent with Clinically established EDD of 09/11/2019. Recommendations: 1.Clinical correlation with the patient's History and Physical Exam. Jenine M. Marciano Sequin RDMS Review of ULTRASOUND.    I have personally reviewed images and report of recent ultrasound done at [facility].    Plan of management to  be discussed with patient. [name], MD, FACOG [facility], [facility] 01/22/2019  1:59 PM   No exam today, due to telephone eVisit due to [facility] virus restriction on elective visits and procedures.  Prior visits reviewed along with ultrasounds/labs as indicated.  Assessment and Plan: 1. [redacted] weeks gestation of pregnancy PNV Declines genetic testing in the future   2. Hypothyroidism, unspecified type Meds, recheck TSH 6-8 weeks  3. Supervision of high risk pregnancy, antepartum Due to thy, prior CS  4. History of cesarean delivery affecting pregnancy Desires VBAC  Follow Up Instructions: 4 weeks follow up   I discussed the assessment and treatment plan with the patient. The patient was provided an opportunity to ask questions and all were answered. The patient agreed with the plan and demonstrated an understanding of the instructions.   The patient was advised to call back or seek an in-person evaluation if the symptoms worsen or if the condition fails to improve as anticipated.  I provided 7 minutes of non-face-to-face time during this encounter.   Letitia Libra, MD [facility], [facility] 01/22/2019  3:40 PM

## 2019-01-22 NOTE — Telephone Encounter (Signed)
Called and left voice mail for patient to call back to be schedule °

## 2019-01-23 NOTE — Telephone Encounter (Signed)
Patient is schedule for appointment on 02/19/19

## 2019-01-24 ENCOUNTER — Other Ambulatory Visit: Payer: Self-pay | Admitting: Obstetrics & Gynecology

## 2019-01-24 ENCOUNTER — Encounter: Payer: Self-pay | Admitting: Obstetrics & Gynecology

## 2019-01-24 MED ORDER — METOCLOPRAMIDE HCL 10 MG PO TABS: 10.0000 mg | ORAL_TABLET | Freq: Four times a day (QID) | ORAL | 8 refills | Status: DC | PRN

## 2019-01-24 MED ORDER — VITAMIN B-6 50 MG PO TABS: 50.0000 mg | ORAL_TABLET | Freq: Three times a day (TID) | ORAL | 8 refills | Status: DC

## 2019-02-11 ENCOUNTER — Ambulatory Visit: Payer: Self-pay | Admitting: Internal Medicine

## 2019-02-13 ENCOUNTER — Telehealth: Payer: Self-pay | Admitting: Obstetrics & Gynecology

## 2019-02-13 NOTE — Telephone Encounter (Signed)
Yes, that would make it a lab visit, and we could see her while also here getting labs

## 2019-02-13 NOTE — Telephone Encounter (Signed)
Pt calling about her appt on 02/19/19. Would like to have the genetic testing done now. Wants to know if this appt should be an in office visit now.    Cb# [phone]

## 2019-02-19 ENCOUNTER — Encounter: Payer: Self-pay | Admitting: Obstetrics & Gynecology

## 2019-02-19 ENCOUNTER — Other Ambulatory Visit: Payer: Self-pay

## 2019-02-19 ENCOUNTER — Ambulatory Visit (INDEPENDENT_AMBULATORY_CARE_PROVIDER_SITE_OTHER): Payer: 59 | Admitting: Obstetrics & Gynecology

## 2019-02-19 VITALS — BP 120/80 | Wt 194.0 lb

## 2019-02-19 DIAGNOSIS — Z3A11 11 weeks gestation of pregnancy: Secondary | ICD-10-CM

## 2019-02-19 DIAGNOSIS — Z1379 Encounter for other screening for genetic and chromosomal anomalies: Secondary | ICD-10-CM | POA: Diagnosis not present

## 2019-02-19 DIAGNOSIS — O99281 Endocrine, nutritional and metabolic diseases complicating pregnancy, first trimester: Secondary | ICD-10-CM

## 2019-02-19 DIAGNOSIS — O0990 Supervision of high risk pregnancy, unspecified, unspecified trimester: Secondary | ICD-10-CM

## 2019-02-19 DIAGNOSIS — O34219 Maternal care for unspecified type scar from previous cesarean delivery: Secondary | ICD-10-CM

## 2019-02-19 DIAGNOSIS — E039 Hypothyroidism, unspecified: Secondary | ICD-10-CM

## 2019-02-19 LAB — POCT URINALYSIS DIPSTICK OB
Glucose, UA: NEGATIVE
POC,PROTEIN,UA: NEGATIVE

## 2019-02-19 NOTE — Patient Instructions (Signed)

## 2019-02-19 NOTE — Progress Notes (Signed)
  Subjective  Fetal Movement? no Min nausea, no pain  Objective  BP 120/80   Wt 194 lb (88 kg)   LMP 12/01/2018 (Approximate)   BMI 35.48 kg/m  General: NAD Pumonary: no increased work of breathing Abdomen: gravid, non-tender Extremities: no edema Psychiatric: mood appropriate, affect full FHT 150s by [place] Assessment  28 y.o. G2P1001 at [redacted]w[redacted]d by  09/07/2019, by Last Menstrual Period presenting for routine prenatal visit  Plan   Problem List Items Addressed This Visit      Endocrine   Hypothyroidism (Chronic)     Other   Supervision of high risk pregnancy, antepartum   History of cesarean delivery affecting pregnancy    Other Visit Diagnoses    [redacted] weeks gestation of pregnancy    -  Primary   Relevant Orders   POC Urinalysis Dipstick OB (Completed)   Encounter for genetic screening for Down Syndrome       Relevant Orders   MaterniT21 PLUS Core+SCA    PNV  [name], MD, [facility], [facility] 02/19/2019  4:08 PM

## 2019-02-24 LAB — MATERNIT21 PLUS CORE+SCA
Fetal Fraction: 6
Monosomy X (Turner Syndrome): NOT DETECTED
Result (T21): NEGATIVE
Trisomy 13 (Patau syndrome): NEGATIVE
Trisomy 18 (Edwards syndrome): NEGATIVE
Trisomy 21 (Down syndrome): NEGATIVE
XXX (Triple X Syndrome): NOT DETECTED
XXY (Klinefelter Syndrome): NOT DETECTED
XYY (Jacobs Syndrome): NOT DETECTED

## 2019-02-25 ENCOUNTER — Telehealth: Payer: Self-pay

## 2019-02-25 ENCOUNTER — Encounter: Payer: Self-pay | Admitting: Obstetrics & Gynecology

## 2019-02-25 NOTE — Telephone Encounter (Signed)
Pt is calling triage line wanting the results of her MaterniT21, I have called pt and informed her everything seemed normal, she wanted to know now what her baby was and I have told her it is a baby GIRL! Pt is very happy sense she already has a boy.

## 2019-03-04 ENCOUNTER — Other Ambulatory Visit: Payer: Self-pay | Admitting: Obstetrics & Gynecology

## 2019-03-04 ENCOUNTER — Encounter: Payer: Self-pay | Admitting: Obstetrics & Gynecology

## 2019-03-04 MED ORDER — FLUCONAZOLE 150 MG PO TABS: 150.0000 mg | ORAL_TABLET | Freq: Once | ORAL | 1 refills | Status: AC

## 2019-03-19 ENCOUNTER — Encounter: Payer: Self-pay | Admitting: Obstetrics & Gynecology

## 2019-03-19 ENCOUNTER — Other Ambulatory Visit: Payer: Self-pay

## 2019-03-19 ENCOUNTER — Ambulatory Visit (INDEPENDENT_AMBULATORY_CARE_PROVIDER_SITE_OTHER): Payer: 59 | Admitting: Obstetrics & Gynecology

## 2019-03-19 VITALS — Wt 195.0 lb

## 2019-03-19 DIAGNOSIS — Z3A15 15 weeks gestation of pregnancy: Secondary | ICD-10-CM

## 2019-03-19 DIAGNOSIS — E039 Hypothyroidism, unspecified: Secondary | ICD-10-CM

## 2019-03-19 DIAGNOSIS — O34219 Maternal care for unspecified type scar from previous cesarean delivery: Secondary | ICD-10-CM

## 2019-03-19 DIAGNOSIS — O0990 Supervision of high risk pregnancy, unspecified, unspecified trimester: Secondary | ICD-10-CM

## 2019-03-19 NOTE — Patient Instructions (Signed)

## 2019-03-19 NOTE — Progress Notes (Signed)
Virtual Visit via Telephone Note  I connected with patient on 03/19/19 at  3:10 PM EDT by telephone and verified that I am speaking with the correct person using two identifiers.   I discussed the limitations, risks, security and privacy concerns of performing an evaluation and management service by telephone and the availability of in person appointments. I also discussed with the patient that there may be a patient responsible charge related to this service. The patient expressed understanding and agreed to proceed.  The patient was at home I spoke with the patient from my  office  Olivia Henderson is a 28 y.o. G2P1001 at [redacted]w[redacted]d being seen today for ongoing prenatal care.  She is currently monitored for the following issues for this low-risk pregnancy and has Hypothyroidism; ADD (attention deficit disorder); Supervision of high risk pregnancy, antepartum; and History of cesarean delivery affecting pregnancy on their problem list.  ----------------------------------------------------------------------------------- Patient reports no complaints.   Denies pain, VB, leaking of fluid.  ----------------------------------------------------------------------------------- The following portions of the patient's history were reviewed and updated as appropriate: allergies, current medications, past family history, past medical history, past social history, past surgical history and problem list. Problem list updated.   Objective  Weight 195 lb (88.5 kg), last menstrual period 12/01/2018. Pregravid weight 192 lb (87.1 kg) Total Weight Gain 3 lb (1.361 kg)  Physical Exam could not be performed. Because of the COVID-19 outbreak this visit was performed over the phone and not in person.   Assessment   28 y.o. G2P1001 at [redacted]w[redacted]d by  09/07/2019, by Last Menstrual Period presenting for routine prenatal visit  Plan   Patient Active Problem List   Diagnosis Date Noted  . Supervision of high risk  pregnancy, antepartum 01/22/2019  . History of cesarean delivery affecting pregnancy 01/22/2019  . ADD (attention deficit disorder) 06/03/2016  . Hypothyroidism 07/10/2014   Plan TSH recheck next visit Anat [place] next visit  Gestational age appropriate obstetric precautions including but not limited to vaginal bleeding, contractions, leaking of fluid and fetal movement were reviewed in detail with the patient.    Clinic [facility] Prenatal Labs  Dating [place] Blood type: O/Positive/-- (03/16 1412)   Genetic Screen  NIPS:nml XX Antibody:Negative (03/16 1412)  Anatomic [place]  Rubella: 1.66 (03/16 1412) Varicella:Im  GTT Third trimester:  RPR: Non Reactive (03/16 1412)   Rhogam O+ HBsAg: Negative (03/16 1412)   TDaP vaccine          Flu [id number] HIV: Non Reactive (03/16 1412)   Baby Food     Breast                           GBS: p  Contraception  Pap: 11/27/2018  CBB  no   CS/VBAC CS   Support Person Husband Olivia Henderson    Follow Up Instructions: 4 weeks w [place]   I discussed the assessment and treatment plan with the patient. The patient was provided an opportunity to ask questions and all were answered. The patient agreed with the plan and demonstrated an understanding of the instructions.   The patient was advised to call back or seek an in-person evaluation if the symptoms worsen or if the condition fails to improve as anticipated.  I provided 5 minutes of non-face-to-face time during this encounter.  Return in about 4 weeks (around 04/16/2019) for ROB w anat [place].  Olivia Major, MD [facility], [facility] 03/19/2019 3:18 PM

## 2019-04-18 ENCOUNTER — Other Ambulatory Visit: Payer: Self-pay | Admitting: Obstetrics & Gynecology

## 2019-04-18 DIAGNOSIS — Z3689 Encounter for other specified antenatal screening: Secondary | ICD-10-CM

## 2019-04-22 ENCOUNTER — Ambulatory Visit (INDEPENDENT_AMBULATORY_CARE_PROVIDER_SITE_OTHER): Payer: 59 | Admitting: Obstetrics & Gynecology

## 2019-04-22 ENCOUNTER — Other Ambulatory Visit: Payer: Self-pay

## 2019-04-22 ENCOUNTER — Ambulatory Visit (INDEPENDENT_AMBULATORY_CARE_PROVIDER_SITE_OTHER): Payer: 59

## 2019-04-22 ENCOUNTER — Encounter: Payer: Self-pay | Admitting: Obstetrics & Gynecology

## 2019-04-22 VITALS — BP 120/80 | Wt 194.0 lb

## 2019-04-22 DIAGNOSIS — Z363 Encounter for antenatal screening for malformations: Secondary | ICD-10-CM | POA: Diagnosis not present

## 2019-04-22 DIAGNOSIS — Z3689 Encounter for other specified antenatal screening: Secondary | ICD-10-CM

## 2019-04-22 DIAGNOSIS — Z3A20 20 weeks gestation of pregnancy: Secondary | ICD-10-CM

## 2019-04-22 DIAGNOSIS — E039 Hypothyroidism, unspecified: Secondary | ICD-10-CM

## 2019-04-22 DIAGNOSIS — O0992 Supervision of high risk pregnancy, unspecified, second trimester: Secondary | ICD-10-CM

## 2019-04-22 DIAGNOSIS — O34219 Maternal care for unspecified type scar from previous cesarean delivery: Secondary | ICD-10-CM

## 2019-04-22 DIAGNOSIS — O0990 Supervision of high risk pregnancy, unspecified, unspecified trimester: Secondary | ICD-10-CM

## 2019-04-22 NOTE — Patient Instructions (Signed)
Tests and Screening During Pregnancy Having certain tests and screenings during pregnancy is an important part of your prenatal care. These tests help your health care provider find problems that might affect your pregnancy. Some tests are done for all pregnant women, and some are optional. Most of the tests and screenings do not pose any risks for you or your baby. You may need additional testing if any routine tests indicate a problem. Tests and screenings done in early pregnancy Some tests and screenings you can expect to have in early pregnancy include:  Blood tests, such as: ? Complete blood count (CBC). This test is done to check your red and white blood cells. It can help identify a risk for anemia, infection, or bleeding. ? Blood typing. This test determines your blood type as well as whether you have a certain protein in your red blood cells (Rh factor). If you do not have this protein (Rh negative) and your baby does have it (Rh positive), your body could make antibodies to the Rh factor. This could be dangerous to your baby's health. ? Tests to check for diseases that can cause birth defects or can be passed to your baby, such as:  [place] measles (rubella). The test indicates whether you are immune to rubella.  Hepatitis B and C. All women are tested for hepatitis B. You may also be tested for hepatitis C if you have risk factors for the condition.  Zika virus infection. You may have a blood or urine test to check for this infection if you or your partner has traveled to an area where the virus occurs.  Urine testing. A urine sample can be tested for diabetes, protein in your urine, and signs of infection.  Testing for sexually transmitted infections (STIs), such as HIV, syphilis, and chlamydia.  Testing for tuberculosis. You may have this skin test if you are at risk for tuberculosis.  Fetal ultrasound. This is an imaging study of your developing baby. It is done using sound waves  and a computer. This test may be done at 11-14 weeks to confirm your pregnancy and help determine your due date. Tests and screenings done later in pregnancy Certain tests are done for the first time during later pregnancy. In addition, some of the tests that were done in early pregnancy are repeated at this time. Some common tests you can expect to have later in pregnancy include:  Rh antibody testing. If you are Rh negative, you will have a blood test at about 28 weeks of pregnancy to see if you are producing Rh antibodies. If you have not started to make antibodies, you will be given an injection to prevent you from making antibodies for the rest of your pregnancy.  Glucose screening. This tests your blood sugar to find out whether you are developing the type of diabetes that occurs during pregnancy (gestational diabetes). You may have this screening earlier if you have risk factors for diabetes.  Screening for group B streptococcus (GBS). GBS is a type of bacteria that may live in your rectum or vagina. You may have GBS without any symptoms. GBS can spread to your baby during birth. This test involves doing a rectal and vaginal swab at 35-37 weeks of pregnancy. If testing is positive for GBS, you may be treated with antibiotic medicine.  CBC to check for anemia and blood-clotting ability.  Urine tests to check for protein, which can be a sign of a condition called preeclampsia.  Fetal ultrasound. This  may be repeated at 16-20 weeks to check how your baby is growing and developing. Screening for birth defects Some birth defects are caused by abnormal genes passed down through families. Early in your pregnancy, tests can be done to find out if your baby is at risk for a genetic disorder. This testing is optional. The type of testing recommended for you will depend on your family and medical history, your ethnicity, and your age. Testing may include:  Screening tests. These tests may include an  ultrasound, blood tests, or a combination of both. The blood tests are used to check for abnormal genes, and the ultrasound is done to look for early birth defects.  Carrier screening. This test involves checking the blood or saliva of both parents to see if they carry abnormal genes that could be passed down to a baby. If genetic screening shows that your baby is at risk for a genetic defect, additional diagnostic testing may be recommended, such as:  Amniocentesis. This involves testing a sample of fluid from your womb (amniotic fluid).  Chorionic villus sampling. In this test, a sample of cells from your placenta is checked for abnormal cells. Unlike other tests done during pregnancy, diagnostic testing does have some risk for your pregnancy. Talk to your health care provider about the risks and benefits of genetic testing. Where to find more information  American Pregnancy Association: americanpregnancy.org/prenatal-testing  Office on Women's Health: KeywordPortfolios.com.br  March of Dimes: marchofdimes.org/pregnancy Questions to ask your health care provider  What routine tests are recommended for me?  When and how will these tests be done?  When will I get the results of routine tests?  What do the results of these tests mean for me or my baby?  Do you recommend any genetic screening tests? Which ones?  Should I see a genetic counselor before having genetic screening? Summary  Having tests and screenings during pregnancy is an important part of your prenatal care.  In early pregnancy, testing may be done to check blood type, Rh status, and risks for various conditions that can affect your baby.  Fetal ultrasound may be done in early pregnancy to confirm a pregnancy and later to look for any birth defects.  Later in pregnancy, tests may include screening for GBS and gestational diabetes.  Genetic testing is optional. Consider talking to a genetic counselor about this  testing. This information is not intended to replace advice given to you by your health care provider. Make sure you discuss any questions you have with your health care provider. Document Released: 01/01/2018 Document Revised: 01/01/2018 Document Reviewed: 01/01/2018 Elsevier Interactive Patient Education  2019 [employer].

## 2019-04-22 NOTE — Progress Notes (Signed)
  Subjective  Fetal Movement? yes Contractions? no Leaking Fluid? no Vaginal Bleeding? no  Objective  BP 120/80   Wt 194 lb (88 kg)   LMP 12/01/2018 (Approximate)   BMI 35.48 kg/m  General: NAD Pumonary: no increased work of breathing Abdomen: gravid, non-tender Extremities: no edema Psychiatric: mood appropriate, affect full  Assessment  28 y.o. G2P1001 at [redacted]w[redacted]d by  09/07/2019, by Last Menstrual Period presenting for routine prenatal visit  Plan   Problem List Items Addressed This Visit      Endocrine   Hypothyroidism - Primary (Chronic)   Relevant Orders   TSH     Other   Supervision of high risk pregnancy, antepartum   History of cesarean delivery affecting pregnancy    Other Visit Diagnoses    [redacted] weeks gestation of pregnancy        Review of ULTRASOUND. I have personally reviewed images and report of recent ultrasound done at [facility]. There is a singleton gestation with subjectively normal amniotic fluid volume. The fetal biometry correlates with established dating. Detailed evaluation of the fetal anatomy was performed.The fetal anatomical survey appears within normal limits within the resolution of ultrasound as described above.  It must be noted that a normal ultrasound is unable to rule out fetal aneuploidy.    [facility]inic [facility] Prenatal Labs  Dating [place] Blood type: O/Positive/-- (03/16 1412)   Genetic Screen  NIPS:nml XX Antibody:Negative (03/16 1412)  Anatomic [place] [facility] nml Rubella: 1.66 (03/16 1412) Varicella:Im  GTT Third trimester:  RPR: Non Reactive (03/16 1412)   Rhogam O+ HBsAg: Negative (03/16 1412)   TDaP vaccine          Flu [id number] HIV: Non Reactive (03/16 1412)   Baby Food     Breast                           GBS:   Contraception  Pap: 11/27/2018  CBB  no   CS/VBAC CS   Support Person Husband Sonny Dandy, MD, [facility], [facility] 04/22/2019  4:14 PM

## 2019-04-23 LAB — TSH: TSH: 2.84 u[IU]/mL (ref 0.450–4.500)

## 2019-04-23 NOTE — Progress Notes (Signed)
Let her know thyroid level normal, continue current dose

## 2019-05-22 ENCOUNTER — Other Ambulatory Visit: Payer: Self-pay

## 2019-05-22 ENCOUNTER — Ambulatory Visit (INDEPENDENT_AMBULATORY_CARE_PROVIDER_SITE_OTHER): Payer: 59 | Admitting: Obstetrics & Gynecology

## 2019-05-22 ENCOUNTER — Encounter: Payer: Self-pay | Admitting: Obstetrics & Gynecology

## 2019-05-22 DIAGNOSIS — Z131 Encounter for screening for diabetes mellitus: Secondary | ICD-10-CM

## 2019-05-22 DIAGNOSIS — E039 Hypothyroidism, unspecified: Secondary | ICD-10-CM

## 2019-05-22 DIAGNOSIS — O34219 Maternal care for unspecified type scar from previous cesarean delivery: Secondary | ICD-10-CM

## 2019-05-22 DIAGNOSIS — Z3A24 24 weeks gestation of pregnancy: Secondary | ICD-10-CM

## 2019-05-22 DIAGNOSIS — O0990 Supervision of high risk pregnancy, unspecified, unspecified trimester: Secondary | ICD-10-CM

## 2019-05-22 DIAGNOSIS — O0992 Supervision of high risk pregnancy, unspecified, second trimester: Secondary | ICD-10-CM

## 2019-05-22 NOTE — Progress Notes (Signed)
Virtual Visit via Telephone Note  I connected with patient on 05/22/19 at  3:50 PM EDT by telephone and verified that I am speaking with the correct person using two identifiers.   I discussed the limitations, risks, security and privacy concerns of performing an evaluation and management service by telephone and the availability of in person appointments. I also discussed with the patient that there may be a patient responsible charge related to this service. The patient expressed understanding and agreed to proceed.  The patient was at home I spoke with the patient from my  office  Olivia Henderson is a 28 y.o. G2P1001 at 5381w4d being seen today for ongoing prenatal care.  She is currently monitored for the following issues for this low-risk pregnancy and has Hypothyroidism; ADD (attention deficit disorder); Supervision of high risk pregnancy, antepartum; and History of cesarean delivery affecting pregnancy on their problem list.  ----------------------------------------------------------------------------------- Patient reports no complaints.   Denies pain, VB, leaking of fluid.  ----------------------------------------------------------------------------------- The following portions of the patient's history were reviewed and updated as appropriate: allergies, current medications, past family history, past medical history, past social history, past surgical history and problem list. Problem list updated.  Objective  Last menstrual period 12/01/2018. Pregravid weight 192 lb (87.1 kg) Total Weight Gain 2 lb (0.907 kg)  Physical Exam could not be performed. Because of the COVID-19 outbreak this visit was performed over the phone and not in person.   Assessment   28 y.o. G2P1001 at 6281w4d by  09/07/2019, by Last Menstrual Period presenting for routine prenatal visit  Plan   Gestational age appropriate obstetric precautions including but not limited to vaginal bleeding, contractions,  leaking of fluid and fetal movement were reviewed in detail with the patient.    [facility]  OB/GYN  Counseling Note  28 y.o. G2P1001 at 8781w4d with Estimated Date of Delivery: 09/07/19 was seen today in office to discuss trial of labor after cesarean section (TOLAC) versus elective repeat cesarean delivery (ERCD). The following risks were discussed with the patient.  Risk of uterine rupture at term is 0.78 percent with TOLAC and 0.22 percent with ERCD. 1 in 10 uterine ruptures will result in neonatal death or neurological injury. The benefits of a trial of labor after cesarean (TOLAC) resulting in a vaginal birth after cesarean (VBAC) include the following: shorter length of hospital stay and postpartum recovery (in most cases); fewer complications, such as postpartum fever, wound or uterine infection, thromboembolism (blood clots in the leg or lung), need for blood transfusion and fewer neonatal breathing problems.  The risks of an attempted VBAC or TOLAC include the following: Risk of failed trial of labor after cesarean (TOLAC) without a vaginal birth after cesarean (VBAC) resulting in repeat cesarean delivery (RCD) in about 20 to 40 percent of women who attempt VBAC.  Risk of rupture of uterus resulting in an emergency cesarean delivery. The risk of uterine rupture may be related in part to the type of uterine incision made during the first cesarean delivery. A previous transverse uterine incision has the lowest risk of rupture (0.2 to 1.5 percent risk). Vertical or T-shaped uterine incisions have a higher risk of uterine rupture (4 to 9 percent risk)The risk of fetal death is very low with both VBAC and elective repeat cesarean delivery (ERCD), but the likelihood of fetal death is higher with VBAC than with ERCD. Maternal death is very rare with either type of delivery.  The risks of an elective repeat cesarean delivery (ERCD)  were reviewed with the patient including but not limited to: 12/998 risk of  uterine rupture which could have serious consequences, bleeding which may require transfusion; infection which may require antibiotics; injury to bowel, bladder or other surrounding organs (bowel, bladder, ureters); injury to the fetus; need for additional procedures including hysterectomy in the event of a life-threatening hemorrhage; thromboembolic phenomenon; abnormal placentation; incisional problems; death and other postoperative or anesthesia complications.    Desires VBAC   Follow Up Instructions: 4 weeks w labs   I discussed the assessment and treatment plan with the patient. The patient was provided an opportunity to ask questions and all were answered. The patient agreed with the plan and demonstrated an understanding of the instructions.   The patient was advised to call back or seek an in-person evaluation if the symptoms worsen or if the condition fails to improve as anticipated.  I provided 8 minutes of non-face-to-face time during this encounter.  Return in about 4 weeks (around 06/19/2019) for ROB w glc.  Barnett Applebaum, MD [facility], [facility] 05/22/2019 4:14 PM

## 2019-05-28 ENCOUNTER — Encounter: Payer: Self-pay | Admitting: Obstetrics & Gynecology

## 2019-06-19 ENCOUNTER — Other Ambulatory Visit: Payer: Self-pay

## 2019-06-19 ENCOUNTER — Other Ambulatory Visit: Payer: 59

## 2019-06-19 ENCOUNTER — Ambulatory Visit (INDEPENDENT_AMBULATORY_CARE_PROVIDER_SITE_OTHER): Payer: 59 | Admitting: Obstetrics & Gynecology

## 2019-06-19 ENCOUNTER — Encounter: Payer: Self-pay | Admitting: Obstetrics & Gynecology

## 2019-06-19 VITALS — BP 120/80 | Wt 203.0 lb

## 2019-06-19 DIAGNOSIS — Z3A28 28 weeks gestation of pregnancy: Secondary | ICD-10-CM

## 2019-06-19 DIAGNOSIS — O99283 Endocrine, nutritional and metabolic diseases complicating pregnancy, third trimester: Secondary | ICD-10-CM

## 2019-06-19 DIAGNOSIS — E039 Hypothyroidism, unspecified: Secondary | ICD-10-CM

## 2019-06-19 DIAGNOSIS — Z131 Encounter for screening for diabetes mellitus: Secondary | ICD-10-CM | POA: Diagnosis not present

## 2019-06-19 DIAGNOSIS — O0990 Supervision of high risk pregnancy, unspecified, unspecified trimester: Secondary | ICD-10-CM

## 2019-06-19 DIAGNOSIS — Z23 Encounter for immunization: Secondary | ICD-10-CM

## 2019-06-19 DIAGNOSIS — O34219 Maternal care for unspecified type scar from previous cesarean delivery: Secondary | ICD-10-CM

## 2019-06-19 LAB — POCT URINALYSIS DIPSTICK OB
Glucose, UA: NEGATIVE
POC,PROTEIN,UA: NEGATIVE

## 2019-06-19 NOTE — Progress Notes (Signed)
  Subjective  Fetal Movement? yes Contractions? no Leaking Fluid? no Vaginal Bleeding? no  Objective  BP 120/80   Wt 203 lb (92.1 kg)   LMP 12/01/2018 (Approximate)   BMI 37.13 kg/m  General: NAD Pumonary: no increased work of breathing Abdomen: gravid, non-tender Extremities: no edema Psychiatric: mood appropriate, affect full  Assessment  28 y.o. G2P1001 at [redacted]w[redacted]d by  09/07/2019, by Last Menstrual Period presenting for routine prenatal visit  Plan   Problem List Items Addressed This Visit      Endocrine   Hypothyroidism (Chronic)     Other   Supervision of high risk pregnancy, antepartum   History of cesarean delivery affecting pregnancy    Other Visit Diagnoses    [redacted] weeks gestation of pregnancy    -  Primary    FLU SHOT today Glucola today Plans VBAC  Barnett Applebaum, MD, [facility], [facility] 06/19/2019  3:04 PM

## 2019-06-19 NOTE — Addendum Note (Signed)
Addended by: Quintella Baton D on: 06/19/2019 03:18 PM   Modules accepted: Orders

## 2019-06-19 NOTE — Patient Instructions (Signed)
Third Trimester of Pregnancy The third trimester is from week 28 through week 40 (months 7 through 9). The third trimester is a time when the unborn baby (fetus) is growing rapidly. At the end of the ninth month, the fetus is about 20 inches in length and weighs 6-10 pounds. Body changes during your third trimester Your body will continue to go through many changes during pregnancy. The changes vary from woman to woman. During the third trimester:  Your weight will continue to increase. You can expect to gain 25-35 pounds (11-16 kg) by the end of the pregnancy.  You may begin to get stretch marks on your hips, abdomen, and breasts.  You may urinate more often because the fetus is moving lower into your pelvis and pressing on your bladder.  You may develop or continue to have heartburn. This is caused by increased hormones that slow down muscles in the digestive tract.  You may develop or continue to have constipation because increased hormones slow digestion and cause the muscles that push waste through your intestines to relax.  You may develop hemorrhoids. These are swollen veins (varicose veins) in the rectum that can itch or be painful.  You may develop swollen, bulging veins (varicose veins) in your legs.  You may have increased body aches in the pelvis, back, or thighs. This is due to weight gain and increased hormones that are relaxing your joints.  You may have changes in your hair. These can include thickening of your hair, rapid growth, and changes in texture. Some women also have hair loss during or after pregnancy, or hair that feels dry or thin. Your hair will most likely return to normal after your baby is born.  Your breasts will continue to grow and they will continue to become tender. A yellow fluid (colostrum) may leak from your breasts. This is the first milk you are producing for your baby.  Your belly button may stick out.  You may notice more swelling in your hands,  face, or ankles.  You may have increased tingling or numbness in your hands, arms, and legs. The skin on your belly may also feel numb.  You may feel short of breath because of your expanding uterus.  You may have more problems sleeping. This can be caused by the size of your belly, increased need to urinate, and an increase in your body's metabolism.  You may notice the fetus "dropping," or moving lower in your abdomen (lightening).  You may have increased vaginal discharge.  You may notice your joints feel loose and you may have pain around your pelvic bone. What to expect at prenatal visits You will have prenatal exams every 2 weeks until week 36. Then you will have weekly prenatal exams. During a routine prenatal visit:  You will be weighed to make sure you and the baby are growing normally.  Your blood pressure will be taken.  Your abdomen will be measured to track your baby's growth.  The fetal heartbeat will be listened to.  Any test results from the previous visit will be discussed.  You may have a cervical check near your due date to see if your cervix has softened or thinned (effaced).  You will be tested for Group B streptococcus. This happens between 35 and 37 weeks. Your health care provider may ask you:  What your birth plan is.  How you are feeling.  If you are feeling the baby move.  If you have had any abnormal   symptoms, such as leaking fluid, bleeding, severe headaches, or abdominal cramping.  If you are using any tobacco products, including cigarettes, chewing tobacco, and electronic cigarettes.  If you have any questions. Other tests or screenings that may be performed during your third trimester include:  Blood tests that check for low iron levels (anemia).  Fetal testing to check the health, activity level, and growth of the fetus. Testing is done if you have certain medical conditions or if there are problems during the pregnancy.  Nonstress test  (NST). This test checks the health of your baby to make sure there are no signs of problems, such as the baby not getting enough oxygen. During this test, a belt is placed around your belly. The baby is made to move, and its heart rate is monitored during movement. What is false labor? False labor is a condition in which you feel small, irregular tightenings of the muscles in the womb (contractions) that usually go away with rest, changing position, or drinking water. These are called Braxton Hicks contractions. Contractions may last for hours, days, or even weeks before true labor sets in. If contractions come at regular intervals, become more frequent, increase in intensity, or become painful, you should see your health care provider. What are the signs of labor?  Abdominal cramps.  Regular contractions that start at 10 minutes apart and become stronger and more frequent with time.  Contractions that start on the top of the uterus and spread down to the lower abdomen and back.  Increased pelvic pressure and dull back pain.  A watery or bloody mucus discharge that comes from the vagina.  Leaking of amniotic fluid. This is also known as your "water breaking." It could be a slow trickle or a gush. Let your health care provider know if it has a color or strange odor. If you have any of these signs, call your health care provider right away, even if it is before your due date. Follow these instructions at home: Medicines  Follow your health care provider's instructions regarding medicine use. Specific medicines may be either safe or unsafe to take during pregnancy.  Take a prenatal vitamin that contains at least 600 micrograms (mcg) of folic acid.  If you develop constipation, try taking a stool softener if your health care provider approves. Eating and drinking   Eat a balanced diet that includes fresh fruits and vegetables, whole grains, good sources of protein such as meat, eggs, or tofu,  and low-fat dairy. Your health care provider will help you determine the amount of weight gain that is right for you.  Avoid raw meat and uncooked cheese. These carry germs that can cause birth defects in the baby.  If you have low calcium intake from food, talk to your health care provider about whether you should take a daily calcium supplement.  Eat four or five small meals rather than three large meals a day.  Limit foods that are high in fat and processed sugars, such as fried and sweet foods.  To prevent constipation: ? Drink enough fluid to keep your urine clear or pale yellow. ? Eat foods that are high in fiber, such as fresh fruits and vegetables, whole grains, and beans. Activity  Exercise only as directed by your health care provider. Most women can continue their usual exercise routine during pregnancy. Try to exercise for 30 minutes at least 5 days a week. Stop exercising if you experience uterine contractions.  Avoid heavy lifting.  Do   not exercise in extreme heat or humidity, or at high altitudes.  Wear low-heel, comfortable shoes.  Practice good posture.  You may continue to have sex unless your health care provider tells you otherwise. Relieving pain and discomfort  Take frequent breaks and rest with your legs elevated if you have leg cramps or low back pain.  Take warm sitz baths to soothe any pain or discomfort caused by hemorrhoids. Use hemorrhoid cream if your health care provider approves.  Wear a good support bra to prevent discomfort from breast tenderness.  If you develop varicose veins: ? Wear support pantyhose or compression stockings as told by your healthcare provider. ? Elevate your feet for 15 minutes, 3-4 times a day. Prenatal care  Write down your questions. Take them to your prenatal visits.  Keep all your prenatal visits as told by your health care provider. This is important. Safety  Wear your seat belt at all times when driving.  Make  a list of emergency phone numbers, including numbers for family, friends, the hospital, and police and fire departments. General instructions  Avoid cat litter boxes and soil used by cats. These carry germs that can cause birth defects in the baby. If you have a cat, ask someone to clean the litter box for you.  Do not travel far distances unless it is absolutely necessary and only with the approval of your health care provider.  Do not use hot tubs, steam rooms, or saunas.  Do not drink alcohol.  Do not use any products that contain nicotine or tobacco, such as cigarettes and e-cigarettes. If you need help quitting, ask your health care provider.  Do not use any medicinal herbs or unprescribed drugs. These chemicals affect the formation and growth of the baby.  Do not douche or use tampons or scented sanitary pads.  Do not cross your legs for long periods of time.  To prepare for the arrival of your baby: ? Take prenatal classes to understand, practice, and ask questions about labor and delivery. ? Make a trial run to the hospital. ? Visit the hospital and tour the maternity area. ? Arrange for maternity or paternity leave through employers. ? Arrange for family and friends to take care of pets while you are in the hospital. ? Purchase a rear-facing car seat and make sure you know how to install it in your car. ? Pack your hospital bag. ? Prepare the baby's nursery. Make sure to remove all pillows and stuffed animals from the baby's crib to prevent suffocation.  Visit your dentist if you have not gone during your pregnancy. Use a soft toothbrush to brush your teeth and be gentle when you floss. Contact a health care provider if:  You are unsure if you are in labor or if your water has broken.  You become dizzy.  You have mild pelvic cramps, pelvic pressure, or nagging pain in your abdominal area.  You have lower back pain.  You have persistent nausea, vomiting, or diarrhea.   You have an unusual or bad smelling vaginal discharge.  You have pain when you urinate. Get help right away if:  Your water breaks before 37 weeks.  You have regular contractions less than 5 minutes apart before 37 weeks.  You have a fever.  You are leaking fluid from your vagina.  You have spotting or bleeding from your vagina.  You have severe abdominal pain or cramping.  You have rapid weight loss or weight gain.  You have   shortness of breath with chest pain.  You notice sudden or extreme swelling of your face, hands, ankles, feet, or legs.  Your baby makes fewer than 10 movements in 2 hours.  You have severe headaches that do not go away when you take medicine.  You have vision changes. Summary  The third trimester is from week 28 through week 40, months 7 through 9. The third trimester is a time when the unborn baby (fetus) is growing rapidly.  During the third trimester, your discomfort may increase as you and your baby continue to gain weight. You may have abdominal, leg, and back pain, sleeping problems, and an increased need to urinate.  During the third trimester your breasts will keep growing and they will continue to become tender. A yellow fluid (colostrum) may leak from your breasts. This is the first milk you are producing for your baby.  False labor is a condition in which you feel small, irregular tightenings of the muscles in the womb (contractions) that eventually go away. These are called Braxton Hicks contractions. Contractions may last for hours, days, or even weeks before true labor sets in.  Signs of labor can include: abdominal cramps; regular contractions that start at 10 minutes apart and become stronger and more frequent with time; watery or bloody mucus discharge that comes from the vagina; increased pelvic pressure and dull back pain; and leaking of amniotic fluid. This information is not intended to replace advice given to you by your health  care provider. Make sure you discuss any questions you have with your health care provider. Document Released: 10/11/2001 Document Revised: 02/07/2019 Document Reviewed: 11/22/2016 Elsevier Patient Education  2020 [employer] Inc[employer]  

## 2019-06-20 LAB — 28 WEEK RH+PANEL
Basophils Absolute: 0 10*3/uL (ref 0.0–0.2)
Basos: 0 %
EOS (ABSOLUTE): 0 10*3/uL (ref 0.0–0.4)
Eos: 0 %
Gestational Diabetes Screen: 110 mg/dL (ref 65–139)
HIV Screen 4th Generation wRfx: NONREACTIVE
Hematocrit: 32.9 % — ABNORMAL LOW (ref 34.0–46.6)
Hemoglobin: 11.7 g/dL (ref 11.1–15.9)
Immature Grans (Abs): 0 10*3/uL (ref 0.0–0.1)
Immature Granulocytes: 1 %
Lymphocytes Absolute: 1.5 10*3/uL (ref 0.7–3.1)
Lymphs: 20 %
MCH: 33.7 pg — ABNORMAL HIGH (ref 26.6–33.0)
MCHC: 35.6 g/dL (ref 31.5–35.7)
MCV: 95 fL (ref 79–97)
Monocytes Absolute: 0.4 10*3/uL (ref 0.1–0.9)
Monocytes: 6 %
Neutrophils Absolute: 5.4 10*3/uL (ref 1.4–7.0)
Neutrophils: 73 %
Platelets: 156 10*3/uL (ref 150–450)
RBC: 3.47 x10E6/uL — ABNORMAL LOW (ref 3.77–5.28)
RDW: 12.8 % (ref 11.7–15.4)
RPR Ser Ql: NONREACTIVE
WBC: 7.4 10*3/uL (ref 3.4–10.8)

## 2019-07-03 ENCOUNTER — Other Ambulatory Visit: Payer: Self-pay

## 2019-07-03 ENCOUNTER — Encounter: Payer: Self-pay | Admitting: Obstetrics & Gynecology

## 2019-07-03 ENCOUNTER — Ambulatory Visit (INDEPENDENT_AMBULATORY_CARE_PROVIDER_SITE_OTHER): Payer: 59 | Admitting: Obstetrics & Gynecology

## 2019-07-03 VITALS — BP 120/80 | Wt 202.0 lb

## 2019-07-03 DIAGNOSIS — O0990 Supervision of high risk pregnancy, unspecified, unspecified trimester: Secondary | ICD-10-CM

## 2019-07-03 DIAGNOSIS — Z23 Encounter for immunization: Secondary | ICD-10-CM | POA: Diagnosis not present

## 2019-07-03 DIAGNOSIS — O0993 Supervision of high risk pregnancy, unspecified, third trimester: Secondary | ICD-10-CM

## 2019-07-03 DIAGNOSIS — O34219 Maternal care for unspecified type scar from previous cesarean delivery: Secondary | ICD-10-CM

## 2019-07-03 DIAGNOSIS — Z3A30 30 weeks gestation of pregnancy: Secondary | ICD-10-CM

## 2019-07-03 DIAGNOSIS — E039 Hypothyroidism, unspecified: Secondary | ICD-10-CM

## 2019-07-03 NOTE — Progress Notes (Signed)
  Subjective  Fetal Movement? yes Contractions? no Leaking Fluid? no Vaginal Bleeding? no  Objective  BP 120/80   Wt 202 lb (91.6 kg)   LMP 12/01/2018 (Approximate)   BMI 36.95 kg/m  General: NAD Pumonary: no increased work of breathing Abdomen: gravid, non-tender Extremities: no edema Psychiatric: mood appropriate, affect full  Assessment  28 y.o. G2P1001 at [redacted]w[redacted]d by  09/07/2019, by Last Menstrual Period presenting for routine prenatal visit  Plan   Problem List Items Addressed This Visit      Endocrine   Hypothyroidism (Chronic)     Other   Supervision of high risk pregnancy, antepartum   History of cesarean delivery affecting pregnancy    Other Visit Diagnoses    [redacted] weeks gestation of pregnancy    -  Primary    PNV, [facility] PTL precautions VBAC discussed.  CS if undelivered post dates (sch for 11/12 at [facility] w [place])     Barnett Applebaum, MD, [facility], [facility] 07/03/2019  4:17 PM

## 2019-07-03 NOTE — Addendum Note (Signed)
Addended by: Quintella Baton D on: 07/03/2019 04:22 PM   Modules accepted: Orders

## 2019-07-03 NOTE — Patient Instructions (Signed)

## 2019-07-05 ENCOUNTER — Telehealth: Payer: Self-pay | Admitting: Obstetrics & Gynecology

## 2019-07-05 NOTE — Telephone Encounter (Signed)
Patient is aware of H&P on 09/05/19 @ 1:30pm w/ Dr. Kenton Kingfisher, Pre-admit testing phone interview to be scheduled, Covid testing on 09/10/19, and OR on 09/12/19. Patient is aware she will be asked to quarantine after Covid testing.

## 2019-07-05 NOTE — Telephone Encounter (Signed)
-----   Message from Gae Dry, MD sent at 07/03/2019  4:17 PM EDT ----- Regarding: Surgery 11/12 Surgery Booking Request Patient Full Name:  Olivia Henderson  MRN: [id number]  DOB: 05-21-1991  Surgeon: Hoyt Koch, MD  Requested Surgery Date and Time: 09/12/19 Primary Diagnosis AND Code: Repeat Cesarean at Term Secondary Diagnosis and Code:  Surgical Procedure: Cesarean Section L&D Notification: Yes Admission Status: surgery admit Length of Surgery: 1 hr Special Case Needs: no H&P: yes (date) Phone Interview???: yes Interpreter: Language:  Medical Clearance: no Special Scheduling Instructions: no Acuity: P2

## 2019-07-12 ENCOUNTER — Encounter: Payer: Self-pay | Admitting: Obstetrics & Gynecology

## 2019-07-12 NOTE — Telephone Encounter (Signed)
Please advise 

## 2019-07-15 ENCOUNTER — Telehealth: Payer: Self-pay | Admitting: Obstetrics & Gynecology

## 2019-07-15 NOTE — Telephone Encounter (Signed)
Patient's H&P needs to be rescheduled from 09/05/19 to 09/04/19 due to a schedule change for Dr. Kenton Kingfisher. Lmtrc.

## 2019-07-17 ENCOUNTER — Encounter: Payer: Self-pay | Admitting: Obstetrics & Gynecology

## 2019-07-17 ENCOUNTER — Ambulatory Visit (INDEPENDENT_AMBULATORY_CARE_PROVIDER_SITE_OTHER): Payer: 59 | Admitting: Obstetrics & Gynecology

## 2019-07-17 ENCOUNTER — Other Ambulatory Visit: Payer: Self-pay

## 2019-07-17 VITALS — BP 100/60 | Wt 209.0 lb

## 2019-07-17 DIAGNOSIS — O0990 Supervision of high risk pregnancy, unspecified, unspecified trimester: Secondary | ICD-10-CM

## 2019-07-17 DIAGNOSIS — O34219 Maternal care for unspecified type scar from previous cesarean delivery: Secondary | ICD-10-CM

## 2019-07-17 DIAGNOSIS — O0993 Supervision of high risk pregnancy, unspecified, third trimester: Secondary | ICD-10-CM

## 2019-07-17 DIAGNOSIS — Z3A32 32 weeks gestation of pregnancy: Secondary | ICD-10-CM

## 2019-07-17 DIAGNOSIS — E039 Hypothyroidism, unspecified: Secondary | ICD-10-CM

## 2019-07-17 LAB — POCT URINALYSIS DIPSTICK OB
Glucose, UA: NEGATIVE
POC,PROTEIN,UA: NEGATIVE

## 2019-07-17 NOTE — Progress Notes (Signed)
  Subjective  Fetal Movement? yes Contractions? no Leaking Fluid? no Vaginal Bleeding? no  Objective  BP 100/60   Wt 209 lb (94.8 kg)   LMP 12/01/2018 (Approximate)   BMI 38.23 kg/m  General: NAD Pumonary: no increased work of breathing Abdomen: gravid, non-tender Extremities: no edema Psychiatric: mood appropriate, affect full  Assessment  28 y.o. G2P1001 at [redacted]w[redacted]d by  09/07/2019, by Last Menstrual Period presenting for routine prenatal visit  Plan   Problem List Items Addressed This Visit      Endocrine   Hypothyroidism (Chronic)     Other   Supervision of high risk pregnancy, antepartum   History of cesarean delivery affecting pregnancy    Other Visit Diagnoses    [redacted] weeks gestation of pregnancy    -  Primary       Clinic [facility] Prenatal Labs  Dating [place] Blood type: O/Positive/-- (03/16 1412)   Genetic Screen  NIPS:nml XX Antibody:Negative (03/16 1412)  Anatomic [place] [facility] nml Rubella: 1.66 (03/16 1412) Varicella:Im  GTT Third trimester:  RPR: Non Reactive (03/16 1412)   Rhogam O+ HBsAg: Negative (03/16 1412)   TDaP vaccine          Flu Shot:06/2019 HIV: Non Reactive (03/16 1412)   Baby Food     Breast                           GBS:   Contraception IUD possibly Pap: 11/27/2018  CBB  no   CS/VBAC CS, planned 11/12; VBAC if labor prior to that   [place], MD, [facility], [facility] 07/17/2019  3:28 PM

## 2019-07-17 NOTE — Patient Instructions (Signed)
Braxton Hicks Contractions Contractions of the uterus can occur throughout pregnancy, but they are not always a sign that you are in labor. You may have practice contractions called Braxton Hicks contractions. These false labor contractions are sometimes confused with true labor. What are Braxton Hicks contractions? Braxton Hicks contractions are tightening movements that occur in the muscles of the uterus before labor. Unlike true labor contractions, these contractions do not result in opening (dilation) and thinning of the cervix. Toward the end of pregnancy (32-34 weeks), Braxton Hicks contractions can happen more often and may become stronger. These contractions are sometimes difficult to tell apart from true labor because they can be very uncomfortable. You should not feel embarrassed if you go to the hospital with false labor. Sometimes, the only way to tell if you are in true labor is for your health care provider to look for changes in the cervix. The health care provider will do a physical exam and may monitor your contractions. If you are not in true labor, the exam should show that your cervix is not dilating and your water has not broken. If there are no other health problems associated with your pregnancy, it is completely safe for you to be sent home with false labor. You may continue to have Braxton Hicks contractions until you go into true labor. How to tell the difference between true labor and false labor True labor  Contractions last 30-70 seconds.  Contractions become very regular.  Discomfort is usually felt in the top of the uterus, and it spreads to the lower abdomen and low back.  Contractions do not go away with walking.  Contractions usually become more intense and increase in frequency.  The cervix dilates and gets thinner. False labor  Contractions are usually shorter and not as strong as true labor contractions.  Contractions are usually irregular.  Contractions  are often felt in the front of the lower abdomen and in the groin.  Contractions may go away when you walk around or change positions while lying down.  Contractions get weaker and are shorter-lasting as time goes on.  The cervix usually does not dilate or become thin. Follow these instructions at home:   Take over-the-counter and prescription medicines only as told by your health care provider.  Keep up with your usual exercises and follow other instructions from your health care provider.  Eat and drink lightly if you think you are going into labor.  If Braxton Hicks contractions are making you uncomfortable: ? Change your position from lying down or resting to walking, or change from walking to resting. ? Sit and rest in a tub of warm water. ? Drink enough fluid to keep your urine pale yellow. Dehydration may cause these contractions. ? Do slow and deep breathing several times an hour.  Keep all follow-up prenatal visits as told by your health care provider. This is important. Contact a health care provider if:  You have a fever.  You have continuous pain in your abdomen. Get help right away if:  Your contractions become stronger, more regular, and closer together.  You have fluid leaking or gushing from your vagina.  You pass blood-tinged mucus (bloody show).  You have bleeding from your vagina.  You have low back pain that you never had before.  You feel your baby's head pushing down and causing pelvic pressure.  Your baby is not moving inside you as much as it used to. Summary  Contractions that occur before labor are   called Braxton Hicks contractions, false labor, or practice contractions.  Braxton Hicks contractions are usually shorter, weaker, farther apart, and less regular than true labor contractions. True labor contractions usually become progressively stronger and regular, and they become more frequent.  Manage discomfort from Braxton Hicks contractions  by changing position, resting in a warm bath, drinking plenty of water, or practicing deep breathing. This information is not intended to replace advice given to you by your health care provider. Make sure you discuss any questions you have with your health care provider. Document Released: 03/02/2017 Document Revised: 09/29/2017 Document Reviewed: 03/02/2017 Elsevier Patient Education  2020 Elsevier Inc.  

## 2019-07-17 NOTE — Addendum Note (Signed)
Addended by: Quintella Baton D on: 07/17/2019 03:32 PM   Modules accepted: Orders

## 2019-07-18 NOTE — Telephone Encounter (Signed)
Patient returned the call and is rescheduled to 09/04/19 at 1:30pm.

## 2019-07-18 NOTE — Telephone Encounter (Signed)
Lmtrc

## 2019-07-25 ENCOUNTER — Telehealth: Payer: Self-pay | Admitting: Obstetrics & Gynecology

## 2019-07-25 DIAGNOSIS — Z3482 Encounter for supervision of other normal pregnancy, second trimester: Secondary | ICD-10-CM | POA: Diagnosis not present

## 2019-07-25 DIAGNOSIS — Z3483 Encounter for supervision of other normal pregnancy, third trimester: Secondary | ICD-10-CM | POA: Diagnosis not present

## 2019-07-25 NOTE — Telephone Encounter (Signed)
Called and left voicemail for patient to call back to be confirm reschedule appointment due to provider change. Patient was schedule on 09/04/19 and has been reschedule to 09/03/19. Waiting on confirmation

## 2019-07-25 NOTE — Telephone Encounter (Signed)
Patient is aware of date, time and location

## 2019-07-31 ENCOUNTER — Other Ambulatory Visit: Payer: Self-pay

## 2019-07-31 ENCOUNTER — Ambulatory Visit (INDEPENDENT_AMBULATORY_CARE_PROVIDER_SITE_OTHER): Payer: 59 | Admitting: Obstetrics & Gynecology

## 2019-07-31 ENCOUNTER — Encounter: Payer: Self-pay | Admitting: Obstetrics & Gynecology

## 2019-07-31 VITALS — BP 120/80 | Wt 212.0 lb

## 2019-07-31 DIAGNOSIS — E039 Hypothyroidism, unspecified: Secondary | ICD-10-CM

## 2019-07-31 DIAGNOSIS — O34219 Maternal care for unspecified type scar from previous cesarean delivery: Secondary | ICD-10-CM

## 2019-07-31 DIAGNOSIS — O0990 Supervision of high risk pregnancy, unspecified, unspecified trimester: Secondary | ICD-10-CM

## 2019-07-31 DIAGNOSIS — O0993 Supervision of high risk pregnancy, unspecified, third trimester: Secondary | ICD-10-CM

## 2019-07-31 DIAGNOSIS — Z3A34 34 weeks gestation of pregnancy: Secondary | ICD-10-CM

## 2019-07-31 NOTE — Patient Instructions (Signed)
Heartburn During Pregnancy ° °Heartburn is pain or discomfort in the throat or chest. It may cause a burning feeling. It happens when stomach acid moves up into the tube that carries food from your mouth to your stomach (esophagus). Heartburn is common during pregnancy. It usually goes away or gets better after giving birth. °Follow these instructions at home: °Eating and drinking °· Do not drink alcohol while you are pregnant. °· Figure out which foods and beverages make you feel worse, and avoid them. °· Beverages that you may want to avoid include: °? Coffee and tea (with or without caffeine). °? Energy drinks and sports drinks. °? Bubbly (carbonated) drinks or sodas. °? Citrus fruit juices. °· Foods that you may want to avoid include: °? Chocolate and cocoa. °? Peppermint and mint flavorings. °? Garlic, onions, and horseradish. °? Spicy and acidic foods. These include peppers, chili powder, curry powder, vinegar, hot sauces, and barbecue sauce. °? Citrus fruits, such as oranges, lemons, and limes. °? Tomato-based foods, such as red sauce, chili, and salsa. °? Fried and fatty foods, such as donuts, french fries, potato chips, and high-fat dressings. °? High-fat meats, such as hot dogs, cold cuts, sausage, ham, and bacon. °? High-fat dairy items, such as whole milk, butter, and cheese. °· Eat small meals often, instead of large meals. °· Avoid drinking a lot of liquid with your meals. °· Avoid eating meals during the 2-3 hours before you go to bed. °· Avoid lying down right after you eat. °· Do not exercise right after you eat. °Medicines °· Take over-the-counter and prescription medicines only as told by your doctor. °· Do not take aspirin, ibuprofen, or other NSAIDs unless your doctor tells you to do that. °· Your doctor may tell you to avoid medicines that have sodium bicarbonate in them. °General instructions ° °· If told, raise the head of your bed about 6 inches (15 cm). You can do this by putting blocks  under the legs. Sleeping with more pillows does not help with heartburn. °· Do not use any products that contain nicotine or tobacco, such as cigarettes and e-cigarettes. If you need help quitting, ask your doctor. °· Wear loose-fitting clothing. °· Try to lower your stress, such as with yoga or meditation. If you need help, ask your doctor. °· Stay at a healthy weight. If you are overweight, work with your doctor to safely lose weight. °· Keep all follow-up visits as told by your doctor. This is important. °Contact a doctor if: °· You get new symptoms. °· Your symptoms do not get better with treatment. °· You have weight loss and you do not know why. °· You have trouble swallowing. °· You make loud sounds when you breathe (wheeze). °· You have a cough that does not go away. °· You have heartburn often for more than 2 weeks. °· You feel sick to your stomach (nauseous), and this does not get better with treatment. °· You are throwing up (vomiting), and this does not get better with treatment. °· You have pain in your belly (abdomen). °Get help right away if: °· You have very bad chest pain that spreads to your arm, neck, or jaw. °· You feel sweaty, dizzy, or light-headed. °· You have trouble breathing. °· You have pain when swallowing. °· You throw up and your throw-up looks like blood or coffee grounds. °· Your poop (stool) is bloody or black. °This information is not intended to replace advice given to you by your health   care provider. Make sure you discuss any questions you have with your health care provider. °Document Released: 11/19/2010 Document Revised: 02/07/2019 Document Reviewed: 07/04/2016 °Elsevier Patient Education © 2020 [employer] Inc. ° ° °

## 2019-07-31 NOTE — Progress Notes (Signed)
  Subjective  Fetal Movement? yes Contractions? no Leaking Fluid? no Vaginal Bleeding? no  Objective  BP 120/80   Wt 212 lb (96.2 kg)   LMP 12/01/2018 (Approximate)   BMI 38.78 kg/m  General: NAD Pumonary: no increased work of breathing Abdomen: gravid, non-tender Extremities: no edema Psychiatric: mood appropriate, affect full SVE: cl/th/hi Assessment  28 y.o. G2P1001 at [redacted]w[redacted]d by  09/07/2019, by Last Menstrual Period presenting for routine prenatal visit  Plan   Problem List Items Addressed This Visit      Endocrine   Hypothyroidism (Chronic)     Other   Supervision of high risk pregnancy, antepartum   History of cesarean delivery affecting pregnancy    Other Visit Diagnoses    [redacted] weeks gestation of pregnancy    -  Primary    Plans VBAC, vs CS if still pregnant 11/12 post dates GBS nv Cont thyroid meds  Barnett Applebaum, MD, [facility], [facility] 07/31/2019  2:37 PM

## 2019-08-14 ENCOUNTER — Encounter: Payer: Self-pay | Admitting: Obstetrics & Gynecology

## 2019-08-14 ENCOUNTER — Other Ambulatory Visit: Payer: Self-pay

## 2019-08-14 ENCOUNTER — Ambulatory Visit (INDEPENDENT_AMBULATORY_CARE_PROVIDER_SITE_OTHER): Payer: 59 | Admitting: Obstetrics & Gynecology

## 2019-08-14 VITALS — BP 120/80 | Wt 215.0 lb

## 2019-08-14 DIAGNOSIS — O0990 Supervision of high risk pregnancy, unspecified, unspecified trimester: Secondary | ICD-10-CM

## 2019-08-14 DIAGNOSIS — E039 Hypothyroidism, unspecified: Secondary | ICD-10-CM

## 2019-08-14 DIAGNOSIS — O34219 Maternal care for unspecified type scar from previous cesarean delivery: Secondary | ICD-10-CM

## 2019-08-14 DIAGNOSIS — Z3685 Encounter for antenatal screening for Streptococcus B: Secondary | ICD-10-CM | POA: Diagnosis not present

## 2019-08-14 DIAGNOSIS — O0993 Supervision of high risk pregnancy, unspecified, third trimester: Secondary | ICD-10-CM

## 2019-08-14 DIAGNOSIS — Z3A36 36 weeks gestation of pregnancy: Secondary | ICD-10-CM

## 2019-08-14 LAB — POCT URINALYSIS DIPSTICK OB
Glucose, UA: NEGATIVE
POC,PROTEIN,UA: NEGATIVE

## 2019-08-14 NOTE — Progress Notes (Signed)
  Subjective  Fetal Movement? yes Contractions? no Leaking Fluid? no Vaginal Bleeding? no  Objective  BP 120/80   Wt 215 lb (97.5 kg)   LMP 12/01/2018 (Approximate)   BMI 39.32 kg/m  General: NAD Pumonary: no increased work of breathing Abdomen: gravid, non-tender Extremities: no edema Psychiatric: mood appropriate, affect full  Assessment  28 y.o. G2P1001 at [redacted]w[redacted]d by  09/07/2019, by Last Menstrual Period presenting for routine prenatal visit  Plan   Problem List Items Addressed This Visit      Endocrine   Hypothyroidism (Chronic)     Other   Supervision of high risk pregnancy, antepartum   History of cesarean delivery affecting pregnancy    Other Visit Diagnoses    Encounter for antenatal screening for Streptococcus B    -  Primary   Relevant Orders   Culture, beta strep (group b only)   [redacted] weeks gestation of pregnancy        PNV, [place], GBS today VBAC if labors prior to Nov 12, otherwise CS Exercises discussed     Barnett Applebaum, MD, [facility], [facility] 08/14/2019  2:50 PM

## 2019-08-14 NOTE — Addendum Note (Signed)
Addended by: Quintella Baton D on: 08/14/2019 03:00 PM   Modules accepted: Orders

## 2019-08-14 NOTE — Patient Instructions (Signed)
Group B Streptococcus Infection During Pregnancy  Group B Streptococcus (GBS) is a type of bacteria (Streptococcus agalactiae) that is often found in healthy people, commonly in the rectum, vagina, and intestines. In people who are healthy and not pregnant, the bacteria rarely cause serious illness or complications. However, women who test positive for GBS during pregnancy can pass the bacteria to their baby during childbirth, which can cause serious infection in the baby after birth. Women with GBS may also have infections during their pregnancy or immediately after childbirth, such as urinary tract infections (UTIs) or infections of the uterus (uterine infections). Having GBS also increases a woman's risk of complications during pregnancy, such as early (preterm) labor or delivery, miscarriage, or stillbirth. Routine testing (screening) for GBS is recommended for all pregnant women. What increases the risk? You may have a higher risk for GBS infection during pregnancy if you had one during a past pregnancy. What are the signs or symptoms? In most cases, GBS infection does not cause symptoms in pregnant women. Signs and symptoms of a possible GBS-related infection may include:  Labor starting before the 37th week of pregnancy.  A UTI or bladder infection, which may cause: ? Fever. ? Pain or burning during urination. ? Frequent urination.  Fever during labor, along with: ? Bad-smelling discharge. ? Uterine tenderness. ? Rapid heartbeat in the mother, baby, or both. Rare but serious symptoms of a possible GBS-related infection in women include:  Blood infection (septicemia). This may cause fever, chills, or confusion.  Lung infection (pneumonia). This may cause fever, chills, cough, rapid breathing, difficulty breathing, or chest pain.  Bone, joint, skin, or soft tissue infection. How is this diagnosed? You may be screened for GBS between week 35 and week 37 of your pregnancy. If you have  symptoms of preterm labor, you may be screened earlier. This condition is diagnosed based on lab test results from:  A swab of fluid from the vagina and rectum.  A urine sample. How is this treated? This condition is treated with antibiotic medicine. When you go into labor, or as soon as your water breaks (your membranes rupture), you will be given antibiotics through an IV tube. Antibiotics will continue until after you give birth. If you are having a cesarean delivery, you do not need antibiotics unless your membranes have already ruptured. Follow these instructions at home:  Take over-the-counter and prescription medicines only as told by your health care provider.  Take your antibiotic medicine as told by your health care provider. Do not stop taking the antibiotic even if you start to feel better.  Keep all pre-birth (prenatal) visits and follow-up visits as told by your health care provider. This is important. Contact a health care provider if:  You have pain or burning when you urinate.  You have to urinate frequently.  You have a fever or chills.  You develop a bad-smelling vaginal discharge. Get help right away if:  Your membranes rupture.  You go into labor.  You have severe pain in your abdomen.  You have difficulty breathing.  You have chest pain. This information is not intended to replace advice given to you by your health care provider. Make sure you discuss any questions you have with your health care provider. Document Released: 01/24/2008 Document Revised: 02/07/2019 Document Reviewed: 05/12/2016 Elsevier Patient Education  2020 Elsevier Inc.  

## 2019-08-18 ENCOUNTER — Encounter: Payer: Self-pay | Admitting: Obstetrics & Gynecology

## 2019-08-18 LAB — CULTURE, BETA STREP (GROUP B ONLY): Strep Gp B Culture: NEGATIVE

## 2019-08-19 ENCOUNTER — Other Ambulatory Visit: Payer: Self-pay | Admitting: Obstetrics & Gynecology

## 2019-08-21 ENCOUNTER — Ambulatory Visit (INDEPENDENT_AMBULATORY_CARE_PROVIDER_SITE_OTHER): Payer: 59 | Admitting: Obstetrics and Gynecology

## 2019-08-21 ENCOUNTER — Encounter: Payer: Self-pay | Admitting: Obstetrics and Gynecology

## 2019-08-21 ENCOUNTER — Other Ambulatory Visit: Payer: Self-pay

## 2019-08-21 VITALS — BP 122/84 | Wt 216.0 lb

## 2019-08-21 DIAGNOSIS — O34219 Maternal care for unspecified type scar from previous cesarean delivery: Secondary | ICD-10-CM

## 2019-08-21 DIAGNOSIS — Z3A37 37 weeks gestation of pregnancy: Secondary | ICD-10-CM

## 2019-08-21 DIAGNOSIS — O0990 Supervision of high risk pregnancy, unspecified, unspecified trimester: Secondary | ICD-10-CM

## 2019-08-21 DIAGNOSIS — E039 Hypothyroidism, unspecified: Secondary | ICD-10-CM

## 2019-08-21 DIAGNOSIS — O0993 Supervision of high risk pregnancy, unspecified, third trimester: Secondary | ICD-10-CM

## 2019-08-21 NOTE — Progress Notes (Signed)
  Routine Prenatal Care Visit  Subjective  Olivia Henderson is a 28 y.o. G2P1001 at [redacted]w[redacted]d being seen today for ongoing prenatal care.  She is currently monitored for the following issues for this high-risk pregnancy and has Hypothyroidism; ADD (attention deficit disorder); Supervision of high risk pregnancy, antepartum; and History of cesarean delivery affecting pregnancy on their problem list.  ----------------------------------------------------------------------------------- Patient reports one night with nausea/emesis that has resolved. Occasional ctx.   Contractions: Irritability. Vag. Bleeding: None.  Movement: Present. Leaking Fluid denies.  ----------------------------------------------------------------------------------- The following portions of the patient's history were reviewed and updated as appropriate: allergies, current medications, past family history, past medical history, past social history, past surgical history and problem list. Problem list updated.  Objective  Blood pressure 122/84, weight 216 lb (98 kg), last menstrual period 12/01/2018. Pregravid weight 192 lb (87.1 kg) Total Weight Gain 24 lb (10.9 kg) Urinalysis: Urine Protein    Urine Glucose    Fetal Status: Fetal Heart Rate (bpm): 140 Fundal Height: 37 cm Movement: Present  Presentation: Vertex  General:  Alert, oriented and cooperative. Patient is in no acute distress.  Skin: Skin is warm and dry. No rash noted.   Cardiovascular: Normal heart rate noted  Respiratory: Normal respiratory effort, no problems with respiration noted  Abdomen: Soft, gravid, appropriate for gestational age. Pain/Pressure: Present     Pelvic:  Cervical exam deferred        Extremities: Normal range of motion.  Edema: None  Mental Status: Normal mood and affect. Normal behavior. Normal judgment and thought content.   Assessment   28 y.o. G2P1001 at [redacted]w[redacted]d by  09/07/2019, by Last Menstrual Period presenting for routine  prenatal visit  Plan   pregnancy2 Problems (from 12/01/18 to present)    Problem Noted Resolved   Supervision of high risk pregnancy, antepartum 01/22/2019 by Gae Dry, MD No   Overview Addendum 07/31/2019  2:45 PM by Gae Dry, MD    [facility] Prenatal Labs  Dating [place] Blood type: O/Positive/-- (03/16 1412)   Genetic Screen  NIPS:nml XX Antibody:Negative (03/16 1412)  Anatomic [place] [facility] nml Rubella: 1.66 (03/16 1412) Varicella:Im  GTT Third trimester:  RPR: Non Reactive (03/16 1412)   Rhogam O+ HBsAg: Negative (03/16 1412)   TDaP vaccine      06/2019  Flu Shot:06/2019 HIV: Non Reactive (03/16 1412)   Baby Food     Breast                           GBS:   Contraception IUD possibly Pap: 11/27/2018  CBB  no   CS/VBAC CS, planned 11/12; VBAC if labor prior to that   [place]         History of cesarean delivery affecting pregnancy 01/22/2019 by Gae Dry, MD No   Overview Signed 08/21/2019  5:08 PM by Will Bonnet, MD    Desires TOLAC - CS 11/12 if not delivered      Hypothyroidism 07/10/2014 by Philemon Kingdom, MD No       Term labor symptoms and general obstetric precautions including but not limited to vaginal bleeding, contractions, leaking of fluid and fetal movement were reviewed in detail with the patient. Please refer to After Visit Summary for other counseling recommendations.   Return in about 1 week (around 08/28/2019) for Routine Prenatal Appointment.  Prentice Docker, MD, [facility], [facility] 08/21/2019 5:07 PM

## 2019-08-26 ENCOUNTER — Encounter: Payer: Self-pay | Admitting: Obstetrics & Gynecology

## 2019-08-27 ENCOUNTER — Encounter: Payer: 59 | Admitting: Maternal Newborn

## 2019-08-28 ENCOUNTER — Other Ambulatory Visit: Payer: Self-pay

## 2019-08-28 ENCOUNTER — Encounter: Payer: Self-pay | Admitting: Maternal Newborn

## 2019-08-28 ENCOUNTER — Ambulatory Visit (INDEPENDENT_AMBULATORY_CARE_PROVIDER_SITE_OTHER): Payer: 59 | Admitting: Maternal Newborn

## 2019-08-28 VITALS — BP 116/76 | Wt 218.0 lb

## 2019-08-28 DIAGNOSIS — O0990 Supervision of high risk pregnancy, unspecified, unspecified trimester: Secondary | ICD-10-CM

## 2019-08-28 DIAGNOSIS — O34219 Maternal care for unspecified type scar from previous cesarean delivery: Secondary | ICD-10-CM

## 2019-08-28 DIAGNOSIS — Z3A38 38 weeks gestation of pregnancy: Secondary | ICD-10-CM

## 2019-08-28 LAB — POCT URINALYSIS DIPSTICK OB
Glucose, UA: NEGATIVE
POC,PROTEIN,UA: NEGATIVE

## 2019-08-28 NOTE — Progress Notes (Signed)
    Routine Prenatal Care Visit  Subjective  Olivia Henderson is a 28 y.o. G2P1001 at [redacted]w[redacted]d being seen today for ongoing prenatal care.  She is currently monitored for the following issues for this high-risk pregnancy and has Hypothyroidism; ADD (attention deficit disorder); Supervision of high risk pregnancy, antepartum; and History of cesarean delivery affecting pregnancy on their problem list.  ----------------------------------------------------------------------------------- Patient reports pelvic pain/pressure and irregular contractions. She is having a lot of swelling when she is on her feet at work which makes ambulation painful. Swelling decreases with rest.  Contractions: Irregular. Vag. Bleeding: None.  Movement: Present. No leaking of fluid.  ----------------------------------------------------------------------------------- The following portions of the patient's history were reviewed and updated as appropriate: allergies, current medications, past family history, past medical history, past social history, past surgical history and problem list. Problem list updated.  Objective  Blood pressure 116/76, weight 218 lb (98.9 kg), last menstrual period 12/01/2018. Pregravid weight 192 lb (87.1 kg) Total Weight Gain 26 lb (11.8 kg) Urinalysis: Urine dipstick shows negative for glucose, protein.  Fetal Status: Fetal Heart Rate (bpm): 140 Fundal Height: 38 cm Movement: Present  Presentation: Vertex  General:  Alert, oriented and cooperative. Patient is in no acute distress.  Skin: Skin is warm and dry. No rash noted.   Cardiovascular: Normal heart rate noted  Respiratory: Normal respiratory effort, no problems with respiration noted  Abdomen: Soft, gravid, appropriate for gestational age. Pain/Pressure: Present     Pelvic:  Cervical exam performed Dilation: Fingertip Effacement (%): 30 Station: -3  Extremities: Normal range of motion.  Edema: Trace  Mental Status: Normal mood  and affect. Normal behavior. Normal judgment and thought content.     Assessment   28 y.o. G2P1001 at [redacted]w[redacted]d, EDD 09/07/2019 by Last Menstrual Period presenting for a routine prenatal visit.  Plan   pregnancy2 Problems (from 12/01/18 to present)    Problem Noted Resolved   Supervision of high risk pregnancy, antepartum 01/22/2019 by Gae Dry, MD No   Overview Addendum 07/31/2019  2:45 PM by Gae Dry, MD    [facility] Prenatal Labs  Dating [place] Blood type: O/Positive/-- (03/16 1412)   Genetic Screen  NIPS:nml XX Antibody:Negative (03/16 1412)  Anatomic [place] [facility] nml Rubella: 1.66 (03/16 1412) Varicella:Im  GTT Third trimester:  RPR: Non Reactive (03/16 1412)   Rhogam O+ HBsAg: Negative (03/16 1412)   TDaP vaccine      06/2019  Flu Shot:06/2019 HIV: Non Reactive (03/16 1412)   Baby Food     Breast                           GBS:   Contraception IUD possibly Pap: 11/27/2018  CBB  no   CS/VBAC CS, planned 11/12; VBAC if labor prior to that   Support Person Husband Matt           History of cesarean delivery affecting pregnancy 01/22/2019 by Gae Dry, MD No   Overview Signed 08/21/2019  5:08 PM by Will Bonnet, MD    Desires TOLAC - CS 11/12 if not delivered      Hypothyroidism 07/10/2014 by Philemon Kingdom, MD No    Work note given to start maternity leave no later than her due date.  Please refer to After Visit Summary for other counseling recommendations.   Return in about 1 week (around 09/04/2019) for ROB.  Avel Sensor, CNM 08/28/2019  2:55 PM

## 2019-08-28 NOTE — Progress Notes (Signed)
C/o constant pain in groin, hips last night, ctxs irreg, didn't sleep but maybe one hour last night. rj

## 2019-09-02 ENCOUNTER — Other Ambulatory Visit: Payer: Self-pay | Admitting: Obstetrics & Gynecology

## 2019-09-03 ENCOUNTER — Encounter: Payer: 59 | Admitting: Obstetrics & Gynecology

## 2019-09-03 ENCOUNTER — Encounter: Payer: Self-pay | Admitting: Obstetrics & Gynecology

## 2019-09-03 ENCOUNTER — Ambulatory Visit (INDEPENDENT_AMBULATORY_CARE_PROVIDER_SITE_OTHER): Payer: 59 | Admitting: Obstetrics & Gynecology

## 2019-09-03 ENCOUNTER — Other Ambulatory Visit: Payer: Self-pay

## 2019-09-03 VITALS — BP 130/80 | Ht 62.0 in | Wt 219.0 lb

## 2019-09-03 DIAGNOSIS — O0990 Supervision of high risk pregnancy, unspecified, unspecified trimester: Secondary | ICD-10-CM

## 2019-09-03 DIAGNOSIS — Z3A39 39 weeks gestation of pregnancy: Secondary | ICD-10-CM

## 2019-09-03 DIAGNOSIS — O34219 Maternal care for unspecified type scar from previous cesarean delivery: Secondary | ICD-10-CM

## 2019-09-03 NOTE — Progress Notes (Signed)
PRE-OPERATIVE HISTORY AND PHYSICAL EXAM  HPI:  Olivia Henderson is a 28 y.o. G2P1001.  Patient's last menstrual period was 12/01/2018 (approximate).  [redacted]w[redacted]d Estimated Date of Delivery: 09/07/19  She is being admitted for Elective repeat and not having gone into natural labor prior to this point (was wanting TOLAC if that occured).  PMHx: She  has a past medical history of Anemia, Chicken pox, GERD (gastroesophageal reflux disease), and Unspecified hypothyroidism (07/10/2014). Also,  has a past surgical history that includes Cesarean section (2014)., family history includes Diabetes in her maternal grandfather; Heart disease in her maternal grandmother; Kidney cancer in her maternal grandmother.,  reports that she has never smoked. She has never used smokeless tobacco. She reports that she does not drink alcohol or use drugs. OB History  Gravida Para Term Preterm AB Living  2 1 1     1   SAB TAB Ectopic Multiple Live Births          1    # Outcome Date GA Lbr Len/2nd Weight Sex Delivery Anes PTL Lv  2 Current           1 Term 09/26/13   8 lb 12.8 oz (3.992 kg) M CS-LVertical  N LIV  Patient denies any other pertinent gynecologic issues. See prenatal record for more complete H&P   Current Outpatient Medications:  .  levothyroxine (SYNTHROID) 100 MCG tablet, Take 1 tablet (100 mcg total) by mouth daily before breakfast., Disp: 30 tablet, Rfl: 11 .  Prenatal Vit-Fe Fumarate-FA (MULTIVITAMIN-PRENATAL) 27-0.8 MG TABS tablet, Take 1 tablet by mouth daily. , Disp: , Rfl:  Also, has No Known Allergies.  Review of Systems  All other systems reviewed and are negative.   Objective: BP 130/80   Ht 5\' 2"  (1.575 m)   Wt 219 lb (99.3 kg)   LMP 12/01/2018 (Approximate)   BMI 40.06 kg/m  Filed Weights   09/03/19 1141  Weight: 219 lb (99.3 kg)   Physical Exam Vitals signs reviewed.   Physical examination Constitutional NAD, Conversant  Skin No rashes, lesions or ulceration. Normal  palpated skin turgor. No nodularity.  Lungs: Clear to auscultation.No rales or wheezes. Normal Respiratory effort, no retractions.  Heart: NSR.  No murmurs or rubs appreciated. No periferal edema  Abdomen: Gravid.  Non-tender.  No masses.  No HSM. No hernia  Extremities: Moves all appropriately.  Normal ROM for age. No lymphadenopathy.  Neuro: Grossly intact  Psych: Oriented to PPT.  Normal mood. Normal affect.     Pelvic:   Vulva: Normal appearance.  No lesions.  Vagina: No lesions or abnormalities noted.  Urethra No masses tenderness or scarring.  Meatus Normal size without lesions or prolapse.  Cervix: FT, Th, High.   Perineum: Normal exam.  No lesions.        Bimanual   Uterus: Enlarged.  Non-tender.    Adnexae: Not palpated.  Cul-de-sac: Negative for abnormality.  FHT 140s  Assessment: 1. History of cesarean delivery affecting pregnancy   2. Supervision of high risk pregnancy, antepartum   3. [redacted] weeks gestation of pregnancy as of 09/03/2019  CS as not having signs of natural labor for a TOLAC  PLAN: 1.  Cesarean Delivery as Scheduled.  Patient will undergo surgical management with Cesarean Section.   The risks of surgery were discussed in detail with the patient including but not limited to: bleeding which may require transfusion or reoperation; infection which may require antibiotics; injury to surrounding organs  which may involve bowel, bladder, ureters ; need for additional procedures including laparoscopy or laparotomy; thromboembolic phenomenon, surgical site problems and other postoperative/anesthesia complications. Likelihood of success in alleviating the patient's condition was discussed. Routine postoperative instructions will be reviewed with the patient and her family in detail after surgery.  The patient concurred with the proposed plan, giving informed written consent for the surgery.  Patient will be NPO procedure.  Preoperative prophylactic antibiotics, as necessary,  and SCDs ordered on call to the OR.   [name], M.D. 09/03/2019 11:59 AM

## 2019-09-03 NOTE — Patient Instructions (Signed)
PRE ADMISSION TESTING For Covid, prior to procedure Monday 9:00-10:00 [facility] entrance (drive up)  Results in 37-85 hours You will not receive notification if test results are negative. If positive for Covid19, your provider will notify you by phone, with additional instructions.   Cesarean Delivery, Care After This sheet gives you information about how to care for yourself after your procedure. Your health care provider may also give you more specific instructions. If you have problems or questions, contact your health care provider. What can I expect after the procedure? After the procedure, it is common to have:  A small amount of blood or clear fluid coming from the incision.  Some redness, swelling, and pain in your incision area.  Some abdominal pain and soreness.  Vaginal bleeding (lochia). Even though you did not have a vaginal delivery, you will still have vaginal bleeding and discharge.  Pelvic cramps.  Fatigue. You may have pain, swelling, and discomfort in the tissue between your vagina and your anus (perineum) if:  Your C-section was unplanned, and you were allowed to labor and push.  An incision was made in the area (episiotomy) or the tissue tore during attempted vaginal delivery. Follow these instructions at home: Incision care   Follow instructions from your health care provider about how to take care of your incision. Make sure you: ? Wash your hands with soap and water before you change your bandage (dressing). If soap and water are not available, use hand sanitizer. ? If you have a dressing, change it or remove it as told by your health care provider. ? Leave stitches (sutures), skin staples, skin glue, or adhesive strips in place. These skin closures may need to stay in place for 2 weeks or longer. If adhesive strip edges start to loosen and curl up, you may trim the loose edges. Do not remove adhesive strips completely unless your health care  provider tells you to do that.  Check your incision area every day for signs of infection. Check for: ? More redness, swelling, or pain. ? More fluid or blood. ? Warmth. ? Pus or a bad smell.  Do not take baths, swim, or use a hot tub until your health care provider says it's okay. Ask your health care provider if you can take showers.  When you cough or sneeze, hug a pillow. This helps with pain and decreases the chance of your incision opening up (dehiscing). Do this until your incision heals. Medicines  Take over-the-counter and prescription medicines only as told by your health care provider.  If you were prescribed an antibiotic medicine, take it as told by your health care provider. Do not stop taking the antibiotic even if you start to feel better.  Do not drive or use heavy machinery while taking prescription pain medicine. Lifestyle  Do not drink alcohol. This is especially important if you are breastfeeding or taking pain medicine.  Do not use any products that contain nicotine or tobacco, such as cigarettes, e-cigarettes, and chewing tobacco. If you need help quitting, ask your health care provider. Eating and drinking  Drink at least 8 eight-ounce glasses of water every day unless told not to by your health care provider. If you breastfeed, you may need to drink even more water.  Eat high-fiber foods every day. These foods may help prevent or relieve constipation. High-fiber foods include: ? Whole grain cereals and breads. ? Brown rice. ? Beans. ? Fresh fruits and vegetables. Activity   If possible,  have someone help you care for your baby and help with household activities for at least a few days after you leave the hospital.  Return to your normal activities as told by your health care provider. Ask your health care provider what activities are safe for you.  Rest as much as possible. Try to rest or take a nap while your baby is sleeping.  Do not lift anything  that is heavier than 10 lbs (4.5 kg), or the limit that you were told, until your health care provider says that it is safe.  Talk with your health care provider about when you can engage in sexual activity. This may depend on your: ? Risk of infection. ? How fast you heal. ? Comfort and desire to engage in sexual activity. General instructions  Do not use tampons or douches until your health care provider approves.  Wear loose, comfortable clothing and a supportive and well-fitting bra.  Keep your perineum clean and dry. Wipe from front to back when you use the toilet.  If you pass a blood clot, save it and call your health care provider to discuss. Do not flush blood clots down the toilet before you get instructions from your health care provider.  Keep all follow-up visits for you and your baby as told by your health care provider. This is important. Contact a health care provider if:  You have: ? A fever. ? Bad-smelling vaginal discharge. ? Pus or a bad smell coming from your incision. ? Difficulty or pain when urinating. ? A sudden increase or decrease in the frequency of your bowel movements. ? More redness, swelling, or pain around your incision. ? More fluid or blood coming from your incision. ? A rash. ? Nausea. ? Little or no interest in activities you used to enjoy. ? Questions about caring for yourself or your baby.  Your incision feels warm to the touch.  Your breasts turn red or become painful or hard.  You feel unusually sad or worried.  You vomit.  You pass a blood clot from your vagina.  You urinate more than usual.  You are dizzy or light-headed. Get help right away if:  You have: ? Pain that does not go away or get better with medicine. ? Chest pain. ? Difficulty breathing. ? Blurred vision or spots in your vision. ? Thoughts about hurting yourself or your baby. ? New pain in your abdomen or in one of your legs. ? A severe headache.  You  faint.  You bleed from your vagina so much that you fill more than one sanitary pad in one hour. Bleeding should not be heavier than your heaviest period. Summary  After the procedure, it is common to have pain at your incision site, abdominal cramping, and slight bleeding from your vagina.  Check your incision area every day for signs of infection.  Tell your health care provider about any unusual symptoms.  Keep all follow-up visits for you and your baby as told by your health care provider. This information is not intended to replace advice given to you by your health care provider. Make sure you discuss any questions you have with your health care provider. Document Released: 07/09/2002 Document Revised: 04/25/2018 Document Reviewed: 04/25/2018 Elsevier Patient Education  2020 [employer]  

## 2019-09-04 ENCOUNTER — Encounter: Payer: 59 | Admitting: Obstetrics & Gynecology

## 2019-09-05 ENCOUNTER — Encounter: Payer: 59 | Admitting: Obstetrics & Gynecology

## 2019-09-08 ENCOUNTER — Observation Stay
Admission: EM | Admit: 2019-09-08 | Discharge: 2019-09-08 | Disposition: A | Discharge status: Home / Self Care | Payer: 59 | Source: Home / Self Care | Admitting: Obstetrics & Gynecology

## 2019-09-08 ENCOUNTER — Other Ambulatory Visit: Payer: Self-pay

## 2019-09-08 ENCOUNTER — Encounter: Payer: Self-pay | Admitting: *Deleted

## 2019-09-08 DIAGNOSIS — O99283 Endocrine, nutritional and metabolic diseases complicating pregnancy, third trimester: Secondary | ICD-10-CM | POA: Insufficient documentation

## 2019-09-08 DIAGNOSIS — O0990 Supervision of high risk pregnancy, unspecified, unspecified trimester: Secondary | ICD-10-CM

## 2019-09-08 DIAGNOSIS — O1203 Gestational edema, third trimester: Secondary | ICD-10-CM | POA: Diagnosis present

## 2019-09-08 DIAGNOSIS — E039 Hypothyroidism, unspecified: Secondary | ICD-10-CM | POA: Insufficient documentation

## 2019-09-08 DIAGNOSIS — O34219 Maternal care for unspecified type scar from previous cesarean delivery: Secondary | ICD-10-CM

## 2019-09-08 DIAGNOSIS — Z79899 Other long term (current) drug therapy: Secondary | ICD-10-CM | POA: Insufficient documentation

## 2019-09-08 DIAGNOSIS — Z3A40 40 weeks gestation of pregnancy: Secondary | ICD-10-CM | POA: Insufficient documentation

## 2019-09-08 DIAGNOSIS — O480 Post-term pregnancy: Secondary | ICD-10-CM | POA: Diagnosis not present

## 2019-09-08 DIAGNOSIS — Z79890 Hormone replacement therapy (postmenopausal): Secondary | ICD-10-CM | POA: Insufficient documentation

## 2019-09-08 HISTORY — DX: Unspecified pre-eclampsia, unspecified trimester: O14.90

## 2019-09-08 HISTORY — DX: Other complications of anesthesia, initial encounter: T88.59XA

## 2019-09-08 LAB — SAMPLE TO BLOOD BANK

## 2019-09-08 LAB — COMPREHENSIVE METABOLIC PANEL
ALT: 12 U/L (ref 0–44)
AST: 19 U/L (ref 15–41)
Albumin: 3.2 g/dL — ABNORMAL LOW (ref 3.5–5.0)
Alkaline Phosphatase: 132 U/L — ABNORMAL HIGH (ref 38–126)
Anion gap: 11 (ref 5–15)
BUN: 8 mg/dL (ref 6–20)
CO2: 20 mmol/L — ABNORMAL LOW (ref 22–32)
Calcium: 9.5 mg/dL (ref 8.9–10.3)
Chloride: 106 mmol/L (ref 98–111)
Creatinine, Ser: 0.63 mg/dL (ref 0.44–1.00)
GFR calc Af Amer: >60 mL/min (ref >60)
GFR calc non Af Amer: >60 mL/min (ref >60)
Glucose, Bld: 80 mg/dL (ref 70–99)
Potassium: 3.6 mmol/L (ref 3.5–5.1)
Sodium: 137 mmol/L (ref 135–145)
Total Bilirubin: 0.6 mg/dL (ref 0.3–1.2)
Total Protein: 6.4 g/dL — ABNORMAL LOW (ref 6.5–8.1)

## 2019-09-08 LAB — PROTEIN / CREATININE RATIO, URINE
Creatinine, Urine: 35 mg/dL
Protein Creatinine Ratio: 0.2 mg/mg{Cre} — ABNORMAL HIGH (ref 0.00–0.15)
Total Protein, Urine: 7 mg/dL

## 2019-09-08 LAB — CBC
HCT: 36.7 % (ref 36.0–46.0)
Hemoglobin: 13 g/dL (ref 12.0–15.0)
MCH: 32.8 pg (ref 26.0–34.0)
MCHC: 35.4 g/dL (ref 30.0–36.0)
MCV: 92.7 fL (ref 80.0–100.0)
Platelets: 151 10*3/uL (ref 150–400)
RBC: 3.96 MIL/uL (ref 3.87–5.11)
RDW: 12.7 % (ref 11.5–15.5)
WBC: 8.3 10*3/uL (ref 4.0–10.5)
nRBC: 0 % (ref 0.0–0.2)

## 2019-09-08 NOTE — OB Triage Note (Signed)
Patient to OBS 3 with complaints of swelling which began this past Friday.  She reports baby moving normally. No bleeding reported.  She does state that her underwear has felt damp over this weekend. Denies headache, right side pain, vision changes. BP 137/82 on arrival.

## 2019-09-08 NOTE — OB Triage Note (Signed)
Discharge home. Left floor ambulatory. Charles Niese S  

## 2019-09-08 NOTE — Final Progress Note (Signed)
Physician Final Progress Note  Patient ID: Olivia Henderson MRN: [id number] DOB/AGE: 1990-11-26 28 y.o.  Admit date: 09/08/2019 Admitting provider: Gae Dry, MD/ [facility]era. Danise Mina, [place] Discharge date: 09/08/2019   Admission Diagnoses: Edema in pregnancy at 40wk1d gestation  Discharge Diagnoses:  Edema in pregnancy at 40wk1d  Consults: None  Significant Findings/ Diagnostic Studies:  OB History & Physical   History of Present Illness:  Chief Complaint:   HPI:  Olivia Henderson is a 28 y.o. G2P1001 with EDC=09/07/2019 female at [redacted]w[redacted]d dated by LMP.  Her pregnancy has been complicated by hypothyroidism, ADD., history of preeclampsia with her first pregnancy, and a prior history of Cesarean section for CPD.   She presents to L&D for evaluation of edema which began 2 days ago. She noticed edema in her lower extremities then in her hands. Denies headache, visual changes,  RUQ pain, CP, or SOB. Baby active. Has been having irregular contractions and a little watery discharge. No vaginal bleeding.    Prenatal care site: [facility] at [facility] has also been remarkable for  [facility] Prenatal Labs  Dating [place] Blood type: O/Positive/-- (03/16 1412)   Genetic Screen  NIPS:nml XX Antibody:Negative (03/16 1412)  Anatomic [place] WSOG nml Rubella: 1.66 (03/16 1412) Varicella:Im  GTT Third trimester:  RPR: Non Reactive (08/19 1531)   Rhogam O+ HBsAg: Negative (03/16 1412)   TDaP vaccine      06/2019  Flu Shot:06/2019 HIV: Non Reactive (08/19 1531)   Baby Food     Breast                           HMC:[id number]/-- (10/14 1631)  Contraception IUD possibly Pap: 11/27/2018  CBB  no   CS/VBAC CS, planned 11/12; VBAC if labor prior to that   [place]          Maternal Medical History:   Past Medical History:  Diagnosis Date  . Anemia    with first pregnancy  . Chicken pox   . Complication of anesthesia    nausea and vomiting  . GERD  (gastroesophageal reflux disease)   . Unspecified hypothyroidism 07/10/2014    Past Surgical History:  Procedure Laterality Date  . CESAREAN SECTION  2014  . WISDOM TOOTH EXTRACTION      No Known Allergies  Prior to Admission medications   Medication Sig Start Date End Date Taking? Authorizing Provider  levothyroxine (SYNTHROID) 100 MCG tablet Take 1 tablet (100 mcg total) by mouth daily before breakfast. 01/15/19  Yes Gae Dry, MD  Prenatal Vit-Fe Fumarate-FA (MULTIVITAMIN-PRENATAL) 27-0.8 MG TABS tablet Take 1 tablet by mouth daily.    Yes [provider]          Social History: She  reports that she has never smoked. She has never used smokeless tobacco. She reports that she does not drink alcohol or use drugs.  Family History: family history includes Diabetes in her maternal grandfather; Heart disease in her maternal grandmother; Kidney cancer in her maternal grandmother.   Review of Systems: Negative x 10 systems reviewed except as noted in the HPI.      Physical Exam:  Vital Signs: BP 128/82   Pulse 80   Temp 98.7 F (37.1 C) (Oral)   Resp 16   Ht 5\' 2"  (1.575 m)   Wt 98.9 kg   LMP 12/01/2018 (Approximate)   BMI 39.87 kg/m   Temp:  [98.7 F (  37.1 C)] 98.7 F (37.1 C) (11/08 1413) Pulse Rate:  [70-89] 80 (11/08 1739) Resp:  [16] 16 (11/08 1413) BP: (122-137)/(69-85) 128/82 (11/08 1739) Weight:  [98.9 kg] 98.9 kg (11/08 1413)   General: WF in  no acute distress.  HEENT: normocephalic, atraumatic Heart: regular rate & rhythm.  No murmurs/rubs/gallops Lungs: clear to auscultation bilaterally Abdomen: soft, gravid, non-tender;  EFW: 9# Pelvic:   External: Normal external female genitalia  Vagina: scant white discharge  Cervix: Dilation: Closed / Effacement (%): Thick / Station: Ballotable / vertex  [employer]: - clue cells; - trichomonas;  +/- yeast  ROM: - pooling;  - ferning Extremities: non-tender, symmetric, +1 pitting edema  bilaterally.   Neurologic: Alert & oriented x 3.   Baseline FHR:  135 baseline with accelerations to 150s to 160s, moderate variability, no decelerations. Toco: irregular contractions, mild  Results for orders placed or performed during the hospital encounter of 09/08/19 (from the past 24 hour(s))  Protein / creatinine ratio, urine     Status: Abnormal   Collection Time: 09/08/19  2:47 PM  Result Value Ref Range   Creatinine, Urine 35 mg/dL   Total Protein, Urine 7 mg/dL   Protein Creatinine Ratio 0.20 (H) 0.00 - 0.15 mg/mg[Cre]  CBC     Status: None   Collection Time: 09/08/19  3:19 PM  Result Value Ref Range   WBC 8.3 4.0 - 10.5 K/uL   RBC 3.96 3.87 - 5.11 MIL/uL   Hemoglobin 13.0 12.0 - 15.0 g/dL   HCT 67.6 72.0 - 94.7 %   MCV 92.7 80.0 - 100.0 fL   MCH 32.8 26.0 - 34.0 pg   MCHC 35.4 30.0 - 36.0 g/dL   RDW 09.6 28.3 - 66.2 %   Platelets 151 150 - 400 K/uL   nRBC 0.0 0.0 - 0.2 %  Comprehensive metabolic panel     Status: Abnormal   Collection Time: 09/08/19  3:19 PM  Result Value Ref Range   Sodium 137 135 - 145 mmol/L   Potassium 3.6 3.5 - 5.1 mmol/L   Chloride 106 98 - 111 mmol/L   CO2 20 (L) 22 - 32 mmol/L   Glucose, Bld 80 70 - 99 mg/dL   BUN 8 6 - 20 mg/dL   Creatinine, Ser 9.47 0.44 - 1.00 mg/dL   Calcium 9.5 8.9 - 65.4 mg/dL   Total Protein 6.4 (L) 6.5 - 8.1 g/dL   Albumin 3.2 (L) 3.5 - 5.0 g/dL   AST 19 15 - 41 U/L   ALT 12 0 - 44 U/L   Alkaline Phosphatase 132 (H) 38 - 126 U/L   Total Bilirubin 0.6 0.3 - 1.2 mg/dL   GFR calc non Af Amer >60 >60 mL/min   GFR calc Af Amer >60 >60 mL/min   Anion gap 11 5 - 15   Assessment/ Plan :  Olivia Henderson is a 28 y.o. G67P1001 female at [redacted]w[redacted]d with edema in pregnancy. No evidence of preeclampsia No evidence of rupture of membranes Not in labor Prior CS scheduled for repeat 11/12 if NIL  FWB: Cat tracing Will discharge home with labor precautions FU at office tomorrow as scheduled Covid testing  tomorrow  Procedures: none  Discharge Condition: stable  Disposition:   Diet: Regular diet  Discharge Activity: Activity as tolerated    Total time spent taking care of this patient: 15 minutes  Signed: Farrel Conners 09/08/2019, 6:23 PM

## 2019-09-09 ENCOUNTER — Encounter: Payer: Self-pay | Admitting: Obstetrics and Gynecology

## 2019-09-09 ENCOUNTER — Ambulatory Visit (INDEPENDENT_AMBULATORY_CARE_PROVIDER_SITE_OTHER): Payer: 59 | Admitting: Obstetrics and Gynecology

## 2019-09-09 ENCOUNTER — Encounter
Admission: RE | Admit: 2019-09-09 | Discharge: 2019-09-09 | Disposition: A | Discharge status: Home / Self Care | Payer: 59 | Source: Ambulatory Visit | Attending: Obstetrics & Gynecology | Admitting: Obstetrics & Gynecology

## 2019-09-09 ENCOUNTER — Encounter: Payer: Self-pay | Admitting: Obstetrics & Gynecology

## 2019-09-09 VITALS — BP 120/70 | Wt 220.0 lb

## 2019-09-09 DIAGNOSIS — Z01812 Encounter for preprocedural laboratory examination: Secondary | ICD-10-CM | POA: Insufficient documentation

## 2019-09-09 DIAGNOSIS — O0990 Supervision of high risk pregnancy, unspecified, unspecified trimester: Secondary | ICD-10-CM

## 2019-09-09 DIAGNOSIS — Z20828 Contact with and (suspected) exposure to other viral communicable diseases: Secondary | ICD-10-CM | POA: Insufficient documentation

## 2019-09-09 DIAGNOSIS — O34219 Maternal care for unspecified type scar from previous cesarean delivery: Secondary | ICD-10-CM

## 2019-09-09 DIAGNOSIS — O480 Post-term pregnancy: Secondary | ICD-10-CM

## 2019-09-09 DIAGNOSIS — Z3A40 40 weeks gestation of pregnancy: Secondary | ICD-10-CM

## 2019-09-09 LAB — SARS CORONAVIRUS 2 (TAT 6-24 HRS): SARS Coronavirus 2: NEGATIVE

## 2019-09-09 NOTE — Progress Notes (Signed)
ROB C/o swelling, some contractions Denies lof, no vb, No Ha's, Good FM Desires cervical check/membrane sweep

## 2019-09-09 NOTE — Patient Instructions (Signed)
Your procedure is scheduled on: Thursday 09/12/19.  Call Labor and Delivery on Wednesday 09/11/19 to find out your arrival time. [phone].  Arrival: Arrive to the [facility]. If your arrival time is prior to 6:00am, please call Labor and Delivery [phone] from your cell phone upon arrival and someone from L&D or security will come down to the [place] entrance and escort you to Labor and Delivery. If your arrival time is 6:00am or later, please enter the [facility] and follow the greeter's instructions.   Partner: ONE dedicated partner/guest is allowed to accompany you throughout your hospital stay. They are allowed to accompany you to the operating room. Your guest may leave and return during normal visiting hours, but will have to be re-screened with each entry to the [facility]. Partners are not allowed to switch out for a new guest at any point during your hospital stay.   Remember: Instructions that are not followed completely may result in serious medical risk, up to and including death, or upon the discretion of your surgeon and anesthesiologist your surgery may need to be rescheduled.      _X__ 1. Do not eat food after midnight the night before your procedure.                 No gum chewing or hard candies. You may drink clear liquids up to 2 hours                 before you are scheduled to arrive for your surgery- DO NOT drink clear                 liquids within 2 hours of the start of your surgery.                 Clear Liquids include:  water, apple juice without pulp, clear carbohydrate                 drink such as Clearfast or Gatorade, Black Coffee or Tea (Do not add                 milk or creamer to coffee or tea).   **Dr. Kenton Kingfisher wants you to drink the Pre-Surgery Ensure on the morning of your procedure. Please finish this an hour prior to your arrival time.**   __X__2.  On the morning of surgery brush your teeth with toothpaste and water, you may rinse  your mouth with mouthwash if you wish.  Do not swallow any toothpaste or mouthwash.    __X__3.  Notify your doctor if there is any change in your medical condition      (cold, fever, infections).       Do not wear jewelry, make-up, hairpins, clips or nail polish. Do not wear lotions, powders, or perfumes.  Do not shave 48 hours prior to surgery. Men may shave face and neck. Do not bring valuables to the hospital.      [facility] is not responsible for any belongings or valuables.    Contacts, dentures/partials or body piercings may not be worn into surgery. Bring a case for your contacts, glasses or hearing aids, a denture cup will be supplied.    __X__ Take these medicines the morning of surgery with A SIP OF WATER:     1. levothyroxine (SYNTHROID) 100 MCG tablet     __X__ Use CHG SAGE wipes as directed    __X__ Stop Anti-inflammatories 7 days before surgery such as Advil,  Ibuprofen, Motrin, BC or Goodies Powder, Naprosyn, Naproxen, Aleve, Aspirin, Meloxicam. May take Tylenol if needed for pain or discomfort.     __X__ Don't start taking any new herbal supplements before your procedure.

## 2019-09-09 NOTE — Progress Notes (Signed)
Routine Prenatal Care Visit  Subjective  Olivia Henderson is a 28 y.o. G2P1001 at [redacted]w[redacted]d being seen today for ongoing prenatal care.  She is currently monitored for the following issues for this high-risk pregnancy and has Hypothyroidism; ADD (attention deficit disorder); Supervision of high risk pregnancy, antepartum; History of cesarean delivery affecting pregnancy; and Edema in pregnancy in third trimester on their problem list.  ----------------------------------------------------------------------------------- Patient reports no complaints.   Contractions: Irregular. Vag. Bleeding: None.  Movement: Present. Denies leaking of fluid.  ----------------------------------------------------------------------------------- The following portions of the patient's history were reviewed and updated as appropriate: allergies, current medications, past family history, past medical history, past social history, past surgical history and problem list. Problem list updated.   Objective  Blood pressure 120/70, weight 220 lb (99.8 kg), last menstrual period 12/01/2018. Pregravid weight 192 lb (87.1 kg) Total Weight Gain 28 lb (12.7 kg) Urinalysis:      Fetal Status: Fetal Heart Rate (bpm): 142 Fundal Height: 43 cm Movement: Present  Presentation: Vertex  General:  Alert, oriented and cooperative. Patient is in no acute distress.  Skin: Skin is warm and dry. No rash noted.   Cardiovascular: Normal heart rate noted  Respiratory: Normal respiratory effort, no problems with respiration noted  Abdomen: Soft, gravid, appropriate for gestational age. Pain/Pressure: Present     Pelvic:  Cervical exam performed Dilation: 1 Effacement (%): 50 Station: -3  Extremities: Normal range of motion.  Edema: Trace  Mental Status: Normal mood and affect. Normal behavior. Normal judgment and thought content.     Assessment   28 y.o. G2P1001 at [redacted]w[redacted]d by  09/07/2019, by Last Menstrual Period presenting for  routine prenatal visit  Plan   pregnancy2 Problems (from 12/01/18 to present)    Problem Noted Resolved   Supervision of high risk pregnancy, antepartum 01/22/2019 by Gae Dry, MD No   Overview Addendum 09/03/2019 11:54 AM by Gae Dry, MD    [facility] Prenatal Labs  Dating [place] Blood type: O/Positive/-- (03/16 1412)   Genetic Screen  NIPS:nml XX Antibody:Negative (03/16 1412)  Anatomic [place] WSOG nml Rubella: 1.66 (03/16 1412) Varicella:Im  GTT Third trimester:  RPR: Non Reactive (08/19 1531)   Rhogam O+ HBsAg: Negative (03/16 1412)   TDaP vaccine      06/2019  Flu Shot:06/2019 HIV: Non Reactive (08/19 1531)   Baby Food     Breast                           ZOX:[id number]/-- (10/14 1631)  Contraception IUD possibly Pap: 11/27/2018  CBB  no   CS/VBAC CS, planned 11/12; VBAC if labor prior to that   [place]           History of cesarean delivery affecting pregnancy 01/22/2019 by Gae Dry, MD No   Overview Signed 08/21/2019  5:08 PM by Will Bonnet, MD    Desires TOLAC - CS 11/12 if not delivered      Hypothyroidism 07/10/2014 by Philemon Kingdom, MD No       Gestational age appropriate obstetric precautions including but not limited to vaginal bleeding, contractions, leaking of fluid and fetal movement were reviewed in detail with the patient.    Membranes swept at maternal request Follow up as needed Labor precautions given  Return if symptoms worsen or fail to improve.  Homero Fellers MD [facility] 09/09/2019, 11:36 AM

## 2019-09-11 ENCOUNTER — Encounter: Payer: Self-pay | Admitting: Obstetrics and Gynecology

## 2019-09-11 ENCOUNTER — Inpatient Hospital Stay
Admission: EM | Admit: 2019-09-11 | Discharge: 2019-09-13 | DRG: 805 | Disposition: A | Discharge status: Home / Self Care | Payer: 59 | Attending: Obstetrics & Gynecology | Admitting: Obstetrics & Gynecology

## 2019-09-11 ENCOUNTER — Inpatient Hospital Stay: Payer: 59 | Admitting: Anesthesiology

## 2019-09-11 ENCOUNTER — Other Ambulatory Visit: Payer: Self-pay

## 2019-09-11 DIAGNOSIS — E039 Hypothyroidism, unspecified: Secondary | ICD-10-CM | POA: Diagnosis present

## 2019-09-11 DIAGNOSIS — O480 Post-term pregnancy: Secondary | ICD-10-CM | POA: Diagnosis not present

## 2019-09-11 DIAGNOSIS — O26893 Other specified pregnancy related conditions, third trimester: Secondary | ICD-10-CM | POA: Diagnosis present

## 2019-09-11 DIAGNOSIS — Z3A39 39 weeks gestation of pregnancy: Secondary | ICD-10-CM

## 2019-09-11 DIAGNOSIS — R000 Tachycardia, unspecified: Secondary | ICD-10-CM | POA: Diagnosis present

## 2019-09-11 DIAGNOSIS — O4292 Full-term premature rupture of membranes, unspecified as to length of time between rupture and onset of labor: Principal | ICD-10-CM | POA: Diagnosis present

## 2019-09-11 DIAGNOSIS — O34219 Maternal care for unspecified type scar from previous cesarean delivery: Secondary | ICD-10-CM | POA: Diagnosis present

## 2019-09-11 DIAGNOSIS — Z3A40 40 weeks gestation of pregnancy: Secondary | ICD-10-CM

## 2019-09-11 DIAGNOSIS — Z20828 Contact with and (suspected) exposure to other viral communicable diseases: Secondary | ICD-10-CM | POA: Diagnosis present

## 2019-09-11 DIAGNOSIS — O4290 Premature rupture of membranes, unspecified as to length of time between rupture and onset of labor, unspecified weeks of gestation: Secondary | ICD-10-CM | POA: Diagnosis present

## 2019-09-11 DIAGNOSIS — O99214 Obesity complicating childbirth: Secondary | ICD-10-CM | POA: Diagnosis present

## 2019-09-11 DIAGNOSIS — O99284 Endocrine, nutritional and metabolic diseases complicating childbirth: Secondary | ICD-10-CM | POA: Diagnosis present

## 2019-09-11 DIAGNOSIS — O411230 Chorioamnionitis, third trimester, not applicable or unspecified: Secondary | ICD-10-CM | POA: Diagnosis present

## 2019-09-11 DIAGNOSIS — O34212 Maternal care for vertical scar from previous cesarean delivery: Secondary | ICD-10-CM

## 2019-09-11 DIAGNOSIS — O0990 Supervision of high risk pregnancy, unspecified, unspecified trimester: Secondary | ICD-10-CM

## 2019-09-11 DIAGNOSIS — O4202 Full-term premature rupture of membranes, onset of labor within 24 hours of rupture: Secondary | ICD-10-CM

## 2019-09-11 LAB — COMPREHENSIVE METABOLIC PANEL
ALT: 11 U/L (ref 0–44)
AST: 21 U/L (ref 15–41)
Albumin: 3.2 g/dL — ABNORMAL LOW (ref 3.5–5.0)
Alkaline Phosphatase: 146 U/L — ABNORMAL HIGH (ref 38–126)
Anion gap: 9 (ref 5–15)
BUN: 9 mg/dL (ref 6–20)
CO2: 22 mmol/L (ref 22–32)
Calcium: 9.5 mg/dL (ref 8.9–10.3)
Chloride: 106 mmol/L (ref 98–111)
Creatinine, Ser: 0.65 mg/dL (ref 0.44–1.00)
GFR calc Af Amer: >60 mL/min (ref >60)
GFR calc non Af Amer: >60 mL/min (ref >60)
Glucose, Bld: 97 mg/dL (ref 70–99)
Potassium: 3.8 mmol/L (ref 3.5–5.1)
Sodium: 137 mmol/L (ref 135–145)
Total Bilirubin: 0.6 mg/dL (ref 0.3–1.2)
Total Protein: 6.3 g/dL — ABNORMAL LOW (ref 6.5–8.1)

## 2019-09-11 LAB — CBC
HCT: 37.1 % (ref 36.0–46.0)
Hemoglobin: 13.1 g/dL (ref 12.0–15.0)
MCH: 32.7 pg (ref 26.0–34.0)
MCHC: 35.3 g/dL (ref 30.0–36.0)
MCV: 92.5 fL (ref 80.0–100.0)
Platelets: 163 10*3/uL (ref 150–400)
RBC: 4.01 MIL/uL (ref 3.87–5.11)
RDW: 12.6 % (ref 11.5–15.5)
WBC: 8.7 10*3/uL (ref 4.0–10.5)
nRBC: 0 % (ref 0.0–0.2)

## 2019-09-11 LAB — TYPE AND SCREEN
ABO/RH(D): O POS
Antibody Screen: NEGATIVE

## 2019-09-11 LAB — PROTEIN / CREATININE RATIO, URINE
Creatinine, Urine: 63 mg/dL
Protein Creatinine Ratio: 0.43 mg/mg{Cre} — ABNORMAL HIGH (ref 0.00–0.15)
Total Protein, Urine: 27 mg/dL

## 2019-09-11 MED ORDER — SODIUM CHLORIDE 0.9 % IV SOLN
INTRAVENOUS | Status: DC | PRN
  Administered 2019-09-11 (×2): 5 mL via EPIDURAL

## 2019-09-11 MED ORDER — LACTATED RINGERS IV SOLN: 500.0000 mL | Freq: Once | INTRAVENOUS | Status: DC

## 2019-09-11 MED ORDER — PHENYLEPHRINE 40 MCG/ML (10ML) SYRINGE FOR IV PUSH (FOR BLOOD PRESSURE SUPPORT): 80.0000 ug | PREFILLED_SYRINGE | INTRAVENOUS | Status: DC | PRN

## 2019-09-11 MED ORDER — SODIUM CHLORIDE 0.9 % IV SOLN: 2.0000 g | INTRAVENOUS | Status: DC

## 2019-09-11 MED ORDER — LIDOCAINE-EPINEPHRINE (PF) 1.5 %-1:200000 IJ SOLN
INTRAMUSCULAR | Status: DC | PRN
  Administered 2019-09-11: 3 mL via EPIDURAL

## 2019-09-11 MED ORDER — FENTANYL 2.5 MCG/ML W/ROPIVACAINE 0.15% IN NS 100 ML EPIDURAL (ARMC)
EPIDURAL | Status: AC
  Filled 2019-09-11: qty 100

## 2019-09-11 MED ORDER — ONDANSETRON HCL 4 MG/2ML IJ SOLN
4.0000 mg | Freq: Four times a day (QID) | INTRAMUSCULAR | Status: DC | PRN
  Administered 2019-09-12: 4 mg via INTRAVENOUS
  Filled 2019-09-11: qty 2

## 2019-09-11 MED ORDER — EPHEDRINE 5 MG/ML INJ: 10.0000 mg | INTRAVENOUS | Status: DC | PRN

## 2019-09-11 MED ORDER — ACETAMINOPHEN 325 MG PO TABS
650.0000 mg | ORAL_TABLET | ORAL | Status: DC | PRN
  Filled 2019-09-11: qty 2

## 2019-09-11 MED ORDER — FENTANYL CITRATE (PF) 100 MCG/2ML IJ SOLN: 50.0000 ug | INTRAMUSCULAR | Status: DC | PRN

## 2019-09-11 MED ORDER — LIDOCAINE HCL (PF) 1 % IJ SOLN: 30.0000 mL | INTRAMUSCULAR | Status: DC | PRN

## 2019-09-11 MED ORDER — OXYTOCIN BOLUS FROM INFUSION
500.0000 mL | Freq: Once | INTRAVENOUS | Status: AC
  Administered 2019-09-12: 500 mL via INTRAVENOUS

## 2019-09-11 MED ORDER — FENTANYL 2.5 MCG/ML W/ROPIVACAINE 0.15% IN NS 100 ML EPIDURAL (ARMC)
12.0000 mL/h | EPIDURAL | Status: DC
  Administered 2019-09-11 – 2019-09-12 (×2): 12 mL/h via EPIDURAL
  Filled 2019-09-11: qty 100

## 2019-09-11 MED ORDER — DIPHENHYDRAMINE HCL 50 MG/ML IJ SOLN: 12.5000 mg | INTRAMUSCULAR | Status: DC | PRN

## 2019-09-11 MED ORDER — LACTATED RINGERS IV SOLN: 500.0000 mL | INTRAVENOUS | Status: DC | PRN

## 2019-09-11 MED ORDER — OXYTOCIN 40 UNITS IN NORMAL SALINE INFUSION - SIMPLE MED
2.5000 [IU]/h | INTRAVENOUS | Status: DC
  Filled 2019-09-11: qty 1000

## 2019-09-11 MED ORDER — LIDOCAINE HCL (PF) 1 % IJ SOLN
INTRAMUSCULAR | Status: DC | PRN
  Administered 2019-09-11: 3 mL via SUBCUTANEOUS

## 2019-09-11 MED ORDER — LEVOTHYROXINE SODIUM 100 MCG PO TABS
100.0000 ug | ORAL_TABLET | Freq: Every day | ORAL | Status: DC
  Filled 2019-09-11: qty 1

## 2019-09-11 MED ORDER — LACTATED RINGERS IV SOLN
INTRAVENOUS | Status: DC
  Administered 2019-09-11 – 2019-09-12 (×2): via INTRAVENOUS

## 2019-09-11 NOTE — OB Triage Note (Signed)
Pt present with c/o leaking of clear fluid and contractions ever "3-89mins." Pt states she felt a large "gush" at 1830 this evening with contractions starting after. Pt denies bleeding, states +FM. Denies headaches, dizziness, blurred vision, or RUQ pain. Toco and EFM applied and explained. All questions answered. Will monitor closely.

## 2019-09-11 NOTE — Anesthesia Preprocedure Evaluation (Addendum)
Anesthesia Evaluation  Patient identified by MRN, date of birth, ID band Patient awake    Reviewed: Allergy & Precautions, H&P , NPO status , Patient's Chart, lab work & pertinent test results, reviewed documented beta blocker date and time   History of Anesthesia Complications Negative for: history of anesthetic complications  Airway Mallampati: III  TM Distance: >3 FB Neck ROM: full    Dental   Permanent retainer on the top:   Pulmonary neg pulmonary ROS,           Cardiovascular Exercise Tolerance: Good hypertension, Normal cardiovascular exam     Neuro/Psych negative neurological ROS  negative psych ROS   GI/Hepatic Neg liver ROS, GERD  ,  Endo/Other  neg diabetesHypothyroidism Morbid obesity  Renal/GU negative Renal ROS  negative genitourinary   Musculoskeletal   Abdominal (+) + obese,   Peds  Hematology  (+) Blood dyscrasia, anemia ,   Anesthesia Other Findings    Reproductive/Obstetrics negative OB ROS                            Anesthesia Physical Anesthesia Plan  ASA: III  Anesthesia Plan: Epidural   Post-op Pain Management:    Induction:   PONV Risk Score and Plan:   Airway Management Planned:   Additional Equipment:   Intra-op Plan:   Post-operative Plan:   Informed Consent: I have reviewed the patients History and Physical, chart, labs and discussed the procedure including the risks, benefits and alternatives for the proposed anesthesia with the patient or authorized representative who has indicated his/her understanding and acceptance.       Plan Discussed with:   Anesthesia Plan Comments:        Anesthesia Quick Evaluation

## 2019-09-11 NOTE — H&P (Signed)
Obstetrics Admission History & Physical   Spontaneous rupture of membranes   HPI:  28 y.o. G2P1001 @ [redacted]w[redacted]d (09/07/2019, by Last Menstrual Period). Admitted on 09/11/2019:   Patient Active Problem List   Diagnosis Date Noted  . PROM (premature rupture of membranes) 09/11/2019  . Edema in pregnancy in third trimester 09/08/2019  . Supervision of high risk pregnancy, antepartum 01/22/2019  . History of cesarean delivery affecting pregnancy 01/22/2019  . ADD (attention deficit disorder) 06/03/2016  . Hypothyroidism 07/10/2014     Presents for rupture of membranes with a large gush of clear fluid at home around 1830. Feeling painful contractions about every 3-4 minutes. No vaginal bleeding. Baby is moving well. Edema in bilateral feet/ankles. No other complaints.  Prenatal care at: at [facility]. Pregnancy complicated by hypothyroidism.  ROS: A review of systems was performed and negative, except as stated in the above HPI.  PMHx:  Past Medical History:  Diagnosis Date  . Anemia    with first pregnancy  . Chicken pox   . Complication of anesthesia    nausea and vomiting  . GERD (gastroesophageal reflux disease)   . Preeclampsia    with G1  . Unspecified hypothyroidism 07/10/2014   PSHx:  Past Surgical History:  Procedure Laterality Date  . CESAREAN SECTION  2014  . WISDOM TOOTH EXTRACTION     Medications:  Medications Prior to Admission  Medication Sig Dispense Refill Last Dose  . levothyroxine (SYNTHROID) 100 MCG tablet Take 1 tablet (100 mcg total) by mouth daily before breakfast. 30 tablet 11   . Prenatal Vit-Fe Fumarate-FA (MULTIVITAMIN-PRENATAL) 27-0.8 MG TABS tablet Take 1 tablet by mouth daily.       Allergies: has No Known Allergies. OBHx:  OB History  Gravida Para Term Preterm AB Living  2 1 1     1   SAB TAB Ectopic Multiple Live Births          1    # Outcome Date GA Lbr Len/2nd Weight Sex Delivery Anes PTL Lv  2 Current           1 Term 09/26/13   3992 g M  CS-LVertical  N LIV   FHx:  Family History  Problem Relation Age of Onset  . Kidney cancer Maternal Grandmother   . Heart disease Maternal Grandmother        CHF  . Diabetes Maternal Grandfather    Soc Hx: Never smoker, Alcohol: none and Recreational drug use: none  Objective:   Vitals:   09/11/19 2050  BP: 131/87  Pulse: 89   Constitutional: Well nourished, well developed female in no acute distress.  HEENT: normal Skin: Warm and dry.  Cardiovascular: Regular rate and rhythm.   Extremity: lower extremity edema to the level of the ankle; 1+ left and 1+ right Respiratory: Clear to auscultation bilaterally. Normal respiratory effort Abdomen: Gravid, painful contractions Neuro: Cranial nerves grossly intact Psych: Alert and Oriented x3. No memory deficits. Normal mood and affect.  MS: normal gait, normal bilateral lower extremity ROM/strength/stability.  Cervix: 2/70/-2 (RN exam) Membranes grossly ruptured, clear fluid  EFM:FHR: 125 bpm, variability: moderate,  accelerations:  Present,  decelerations:  Absent Toco: occasional contractions, patient reports every 3-4 minutes, difficult to trace on tocometry   Perinatal info:  Blood type: O positive Rubella - Immune Varicella - Immune TDaP Given during third trimester of this pregnancy on 07/03/2019 RPR NR / HIV Neg/ HBsAg Neg   Assessment & Plan:   28 y.o. G2P1001 @  [redacted]w[redacted]d, Admitted on 09/11/2019 with PROM, desires TOLAC.    Admit for labor, Observe for cervical change, Fetal Wellbeing Reassuring, Epidural when ready, AROM when Appropriate and GBS status negative, treat as needed  Avel Sensor, [facility], [facility] 09/11/2019  9:17 PM

## 2019-09-11 NOTE — Anesthesia Procedure Notes (Signed)
Epidural Patient location during procedure: OB Start time: 09/11/2019 10:53 PM End time: 09/11/2019 10:58 PM  Staffing Anesthesiologist: Martha Clan, MD Performed: anesthesiologist   Preanesthetic Checklist Completed: patient identified, site marked, surgical consent, pre-op evaluation, timeout performed, IV checked, risks and benefits discussed and monitors and equipment checked  Epidural Patient position: sitting Prep: ChloraPrep Patient monitoring: heart rate, continuous pulse ox and blood pressure Approach: midline Location: L3-L4 Injection technique: LOR saline  Needle:  Needle type: Tuohy  Needle gauge: 17 G Needle length: 9 cm and 9 Needle insertion depth: 5 cm Catheter type: closed end flexible Catheter size: 19 Gauge Catheter at skin depth: 10 cm Test dose: negative and 1.5% lidocaine with Epi 1:200 K  Assessment Sensory level: T10 Events: blood not aspirated, injection not painful, no injection resistance, negative IV test and no paresthesia  Additional Notes 1st attempt Pt. Evaluated and documentation done after procedure finished. Patient identified. Risks/Benefits/Options discussed with patient including but not limited to bleeding, infection, nerve damage, paralysis, failed block, incomplete pain control, headache, blood pressure changes, nausea, vomiting, reactions to medication both or allergic, itching and postpartum back pain. Confirmed with bedside nurse the patient's most recent platelet count. Confirmed with patient that they are not currently taking any anticoagulation, have any bleeding history or any family history of bleeding disorders. Patient expressed understanding and wished to proceed. All questions were answered. Sterile technique was used throughout the entire procedure. Please see nursing notes for vital signs. Test dose was given through epidural catheter and negative prior to continuing to dose epidural or start infusion. Warning signs of  high block given to the patient including shortness of breath, tingling/numbness in hands, complete motor block, or any concerning symptoms with instructions to call for help. Patient was given instructions on fall risk and not to get out of bed. All questions and concerns addressed with instructions to call with any issues or inadequate analgesia.   Patient tolerated the insertion well without immediate complications.Reason for block:procedure for pain

## 2019-09-12 ENCOUNTER — Inpatient Hospital Stay: Admission: RE | Admit: 2019-09-12 | Payer: 59 | Source: Home / Self Care | Admitting: Obstetrics & Gynecology

## 2019-09-12 ENCOUNTER — Telehealth: Payer: Self-pay | Admitting: Obstetrics & Gynecology

## 2019-09-12 ENCOUNTER — Encounter
Admission: EM | Disposition: A | Discharge status: Home / Self Care | Payer: Self-pay | Source: Home / Self Care | Attending: Obstetrics & Gynecology

## 2019-09-12 DIAGNOSIS — O34219 Maternal care for unspecified type scar from previous cesarean delivery: Secondary | ICD-10-CM

## 2019-09-12 LAB — CBC
HCT: 32.5 % — ABNORMAL LOW (ref 36.0–46.0)
Hemoglobin: 11.4 g/dL — ABNORMAL LOW (ref 12.0–15.0)
MCH: 32.9 pg (ref 26.0–34.0)
MCHC: 35.1 g/dL (ref 30.0–36.0)
MCV: 93.9 fL (ref 80.0–100.0)
Platelets: 156 10*3/uL (ref 150–400)
RBC: 3.46 MIL/uL — ABNORMAL LOW (ref 3.87–5.11)
RDW: 13.1 % (ref 11.5–15.5)
WBC: 18.9 10*3/uL — ABNORMAL HIGH (ref 4.0–10.5)
nRBC: 0 % (ref 0.0–0.2)

## 2019-09-12 LAB — SYPHILIS: RPR W/REFLEX TO RPR TITER AND TREPONEMAL ANTIBODIES, TRADITIONAL SCREENING AND DIAGNOSIS ALGORITHM: RPR Ser Ql: NONREACTIVE

## 2019-09-12 SURGERY — Surgical Case
Anesthesia: Choice

## 2019-09-12 MED ORDER — SODIUM CHLORIDE 0.9 % IV SOLN: 250.0000 mL | INTRAVENOUS | Status: DC | PRN

## 2019-09-12 MED ORDER — PROPOFOL 10 MG/ML IV BOLUS
INTRAVENOUS | Status: AC
  Filled 2019-09-12: qty 20

## 2019-09-12 MED ORDER — LIDOCAINE HCL 2 % IJ SOLN
INTRAMUSCULAR | Status: AC
  Filled 2019-09-12: qty 10

## 2019-09-12 MED ORDER — IBUPROFEN 600 MG PO TABS
600.0000 mg | ORAL_TABLET | Freq: Four times a day (QID) | ORAL | Status: DC
  Administered 2019-09-12 – 2019-09-13 (×5): 600 mg via ORAL
  Filled 2019-09-12 (×5): qty 1

## 2019-09-12 MED ORDER — ACETAMINOPHEN 325 MG PO TABS
650.0000 mg | ORAL_TABLET | Freq: Four times a day (QID) | ORAL | Status: DC | PRN
  Administered 2019-09-12: 650 mg via ORAL

## 2019-09-12 MED ORDER — SCOPOLAMINE 1 MG/3DAYS TD PT72: 1.0000 | MEDICATED_PATCH | TRANSDERMAL | Status: DC

## 2019-09-12 MED ORDER — DIPHENHYDRAMINE HCL 25 MG PO CAPS: 25.0000 mg | ORAL_CAPSULE | Freq: Four times a day (QID) | ORAL | Status: DC | PRN

## 2019-09-12 MED ORDER — DIBUCAINE (PERIANAL) 1 % EX OINT
1.0000 "application " | TOPICAL_OINTMENT | CUTANEOUS | Status: DC | PRN
  Administered 2019-09-12: 1 via RECTAL
  Filled 2019-09-12 (×2): qty 28

## 2019-09-12 MED ORDER — SIMETHICONE 80 MG PO CHEW: 80.0000 mg | CHEWABLE_TABLET | ORAL | Status: DC | PRN

## 2019-09-12 MED ORDER — PHENYLEPHRINE HCL (PRESSORS) 10 MG/ML IV SOLN
INTRAVENOUS | Status: AC
  Filled 2019-09-12: qty 1

## 2019-09-12 MED ORDER — EPHEDRINE SULFATE 50 MG/ML IJ SOLN
INTRAMUSCULAR | Status: AC
  Filled 2019-09-12: qty 1

## 2019-09-12 MED ORDER — OXYCODONE-ACETAMINOPHEN 5-325 MG PO TABS: 2.0000 | ORAL_TABLET | ORAL | Status: DC | PRN

## 2019-09-12 MED ORDER — MIDAZOLAM HCL 2 MG/2ML IJ SOLN
INTRAMUSCULAR | Status: AC
  Filled 2019-09-12: qty 2

## 2019-09-12 MED ORDER — SODIUM CHLORIDE 0.9 % IV SOLN
INTRAVENOUS | Status: AC
  Filled 2019-09-12: qty 2

## 2019-09-12 MED ORDER — SENNOSIDES-DOCUSATE SODIUM 8.6-50 MG PO TABS
2.0000 | ORAL_TABLET | ORAL | Status: DC
  Administered 2019-09-13: 2 via ORAL
  Filled 2019-09-12: qty 2

## 2019-09-12 MED ORDER — ACETAMINOPHEN 325 MG PO TABS
650.0000 mg | ORAL_TABLET | ORAL | Status: DC | PRN
  Administered 2019-09-12 – 2019-09-13 (×4): 650 mg via ORAL
  Filled 2019-09-12 (×4): qty 2

## 2019-09-12 MED ORDER — WITCH HAZEL-GLYCERIN EX PADS
1.0000 "application " | MEDICATED_PAD | CUTANEOUS | Status: DC | PRN
  Administered 2019-09-12 – 2019-09-13 (×2): 1 via TOPICAL
  Filled 2019-09-12 (×2): qty 100

## 2019-09-12 MED ORDER — LACTATED RINGERS IV SOLN: INTRAVENOUS | Status: DC

## 2019-09-12 MED ORDER — BENZOCAINE-MENTHOL 20-0.5 % EX AERO
1.0000 "application " | INHALATION_SPRAY | CUTANEOUS | Status: DC | PRN
  Administered 2019-09-12 – 2019-09-13 (×2): 1 via TOPICAL
  Filled 2019-09-12: qty 56

## 2019-09-12 MED ORDER — SODIUM CHLORIDE 0.9% FLUSH: 3.0000 mL | INTRAVENOUS | Status: DC | PRN

## 2019-09-12 MED ORDER — SODIUM CHLORIDE 0.9 % IV SOLN
2.0000 g | Freq: Once | INTRAVENOUS | Status: AC
  Administered 2019-09-12: 2 g via INTRAVENOUS

## 2019-09-12 MED ORDER — OXYCODONE-ACETAMINOPHEN 5-325 MG PO TABS
1.0000 | ORAL_TABLET | ORAL | Status: DC | PRN
  Administered 2019-09-13 (×2): 1 via ORAL
  Filled 2019-09-12 (×2): qty 1

## 2019-09-12 MED ORDER — BENZOCAINE-MENTHOL 20-0.5 % EX AERO
INHALATION_SPRAY | CUTANEOUS | Status: AC
  Filled 2019-09-12: qty 56

## 2019-09-12 MED ORDER — ONDANSETRON HCL 4 MG/2ML IJ SOLN: 4.0000 mg | INTRAMUSCULAR | Status: DC | PRN

## 2019-09-12 MED ORDER — LEVOTHYROXINE SODIUM 100 MCG PO TABS
100.0000 ug | ORAL_TABLET | Freq: Every day | ORAL | Status: DC
  Administered 2019-09-13: 06:00:00 100 ug via ORAL
  Filled 2019-09-12 (×2): qty 1

## 2019-09-12 MED ORDER — FENTANYL CITRATE (PF) 100 MCG/2ML IJ SOLN
INTRAMUSCULAR | Status: AC
  Filled 2019-09-12: qty 2

## 2019-09-12 MED ORDER — ONDANSETRON HCL 4 MG PO TABS: 4.0000 mg | ORAL_TABLET | ORAL | Status: DC | PRN

## 2019-09-12 MED ORDER — ZOLPIDEM TARTRATE 5 MG PO TABS: 5.0000 mg | ORAL_TABLET | Freq: Every evening | ORAL | Status: DC | PRN

## 2019-09-12 MED ORDER — COCONUT OIL OIL
1.0000 "application " | TOPICAL_OIL | Status: DC | PRN
  Filled 2019-09-12 (×2): qty 120

## 2019-09-12 MED ORDER — SODIUM CHLORIDE 0.9% FLUSH
3.0000 mL | Freq: Two times a day (BID) | INTRAVENOUS | Status: DC
  Administered 2019-09-13 (×2): 3 mL via INTRAVENOUS

## 2019-09-12 MED ORDER — MISOPROSTOL 200 MCG PO TABS
800.0000 ug | ORAL_TABLET | Freq: Once | ORAL | Status: AC
  Administered 2019-09-12: 800 ug via RECTAL

## 2019-09-12 MED ORDER — LACTATED RINGERS IV BOLUS
500.0000 mL | Freq: Once | INTRAVENOUS | Status: AC
  Administered 2019-09-12: 500 mL via INTRAVENOUS

## 2019-09-12 SURGICAL SUPPLY — 24 items
CANISTER SUCT 3000ML PPV (MISCELLANEOUS) ×2 IMPLANT
CATH KIT ON-Q SILVERSOAK 5IN (CATHETERS) ×4 IMPLANT
CHLORAPREP W/TINT 26 (MISCELLANEOUS) ×4 IMPLANT
COVER WAND RF STERILE (DRAPES) ×2 IMPLANT
DERMABOND ADVANCED (GAUZE/BANDAGES/DRESSINGS) ×1
DERMABOND ADVANCED .7 DNX12 (GAUZE/BANDAGES/DRESSINGS) ×1 IMPLANT
ELECT CAUTERY BLADE 6.4 (BLADE) ×2 IMPLANT
ELECT REM PT RETURN 9FT ADLT (ELECTROSURGICAL) ×2
ELECTRODE REM PT RTRN 9FT ADLT (ELECTROSURGICAL) ×1 IMPLANT
GLOVE SKINSENSE NS SZ8.0 LF (GLOVE) ×1
GLOVE SKINSENSE STRL SZ8.0 LF (GLOVE) ×1 IMPLANT
GOWN STRL REUS W/ TWL LRG LVL3 (GOWN DISPOSABLE) ×1 IMPLANT
GOWN STRL REUS W/ TWL XL LVL3 (GOWN DISPOSABLE) ×2 IMPLANT
GOWN STRL REUS W/TWL LRG LVL3 (GOWN DISPOSABLE) ×1
GOWN STRL REUS W/TWL XL LVL3 (GOWN DISPOSABLE) ×2
NS IRRIG 1000ML POUR BTL (IV SOLUTION) ×2 IMPLANT
PACK C SECTION AR (MISCELLANEOUS) ×2 IMPLANT
PAD OB MATERNITY 4.3X12.25 (PERSONAL CARE ITEMS) ×2 IMPLANT
PAD PREP 24X41 OB/GYN DISP (PERSONAL CARE ITEMS) ×2 IMPLANT
PENCIL SMOKE ULTRAEVAC 22 CON (MISCELLANEOUS) ×2 IMPLANT
SUT MAXON ABS #0 GS21 30IN (SUTURE) ×4 IMPLANT
SUT VIC AB 1 CT1 36 (SUTURE) ×6 IMPLANT
SUT VIC AB 2-0 CT1 36 (SUTURE) ×2 IMPLANT
SUT VIC AB 4-0 FS2 27 (SUTURE) ×2 IMPLANT

## 2019-09-12 NOTE — Plan of Care (Signed)
Pt delivered at [place]. Pt labored and pushed well. Pt pain managed well and recovered well.  See notes for postpartum complications with maternal HR and temp/bleeding.

## 2019-09-12 NOTE — Progress Notes (Signed)
  Labor Progress Note   28 y.o. G2P1001 @ [redacted]w[redacted]d , admitted for  Pregnancy, Labor Management.   Subjective:  Min pain w epidural Cx complete now  Objective:  BP 133/81   Pulse 78   Temp 98 F (36.7 C) (Oral)   Resp 16   Ht 5\' 2"  (1.575 m)   Wt 99 kg   LMP 12/01/2018 (Approximate)   SpO2 100%   BMI 39.92 kg/m  Abd: gravid, ND, FHT present, mild tenderness on exam Extr: trace to 1+ bilateral pedal edema SVE: complete, -1  EFM: FHR: 140 bpm, variability: moderate,  accelerations:  Present,  decelerations:  Absent although she has had some earl decels Toco: Frequency: Every 2-3 minutes Labs: I have reviewed the patient's lab results.   Assessment & Plan:  G2P1001 @ [redacted]w[redacted]d, admitted for  Pregnancy and Labor/Delivery Management  1. Pain management: epidural. 2. FWB: FHT category 1.  3. ID: GBS negative 4. Labor management: Start second stage  Risks of arrest of descent or [employer] discussed (prior pregnancy ended w CS after second stage arrest)  All discussed with patient, see orders  Barnett Applebaum, MD, [facility], [facility] 09/12/2019  6:12 AM

## 2019-09-12 NOTE — Discharge Summary (Signed)
OB Discharge Summary     Patient Name: Olivia Henderson DOB: [birth year] MRN: [id number]  Date of admission: 09/11/2019 Delivering MD: Hoyt Koch, MD  Date of Delivery: 09/11/2019  Date of discharge: 09/13/2019  Admitting diagnosis: 39 weeks preg contractions Intrauterine pregnancy: [redacted]w[redacted]d     Secondary diagnosis: TOLAC     Discharge diagnosis: Term Pregnancy Delivered and VBAC, hypothyroidism; successful [facility] course:  Onset of Labor With Vaginal Delivery     28 y.o. yo G2P1001 at [redacted]w[redacted]d was admitted in Active Labor on 09/11/2019.  Pt desired TOLAC, prior CS 5 years ago for arrest of descent.  Patient had an uncomplicated labor course as follows:  Membrane Rupture Time/Date: 7:00 PM ,09/11/2019   Intrapartum Procedures: Episiotomy: None [1]                                         Lacerations:  2nd degree [3]  Patient had a delivery of a Viable infant. 09/12/2019  Information for the patient's newborn:  Lenise, Jr [[id number]]  Delivery Method: VBAC, Vacuum     Patient had an uncomplicated postpartum course.  She is ambulating, tolerating a regular diet, passing flatus, and urinating well. Patient is discharged home in stable condition on 09/13/19. She reports baby is nursing well using a nipple shield. She is also pumping and feeding by bottle.                                                                  Post partum procedures:none  Complications: None  Physical exam on 09/13/2019: Vitals:   09/12/19 1920 09/12/19 2309 09/13/19 0319 09/13/19 0837  BP: 120/76 (!) 100/59 104/68 120/76  Pulse: 96 89 89 91  Resp: 16 18 16 20   Temp: 98 F (36.7 C) 97.8 F (36.6 C) 97.9 F (36.6 C) 97.7 F (36.5 C)  TempSrc: Oral Oral Oral Oral  SpO2: 99% 99% 98% 99%  Weight:      Height:       General: alert, cooperative and no distress Lochia: appropriate Uterine Fundus: firm Incision: N/A DVT Evaluation: No evidence of DVT  seen on physical exam.  Labs: Lab Results  Component Value Date   WBC 10.6 (H) 09/13/2019   HGB 8.3 (L) 09/13/2019   HCT 23.6 (L) 09/13/2019   MCV 93.3 09/13/2019   PLT 124 (L) 09/13/2019   CMP Latest Ref Rng & Units 09/11/2019  Glucose 70 - 99 mg/dL 97  BUN 6 - 20 mg/dL 9  Creatinine 0.44 - 1.00 mg/dL 0.65  Sodium 135 - 145 mmol/L 137  Potassium 3.5 - 5.1 mmol/L 3.8  Chloride 98 - 111 mmol/L 106  CO2 22 - 32 mmol/L 22  Calcium 8.9 - 10.3 mg/dL 9.5  Total Protein 6.5 - 8.1 g/dL 6.3(L)  Total Bilirubin 0.3 - 1.2 mg/dL 0.6  Alkaline Phos 38 - 126 U/L 146(H)  AST 15 - 41 U/L 21  ALT 0 - 44 U/L 11    Discharge instruction: per After Visit Summary.  Medications:  Allergies as of 09/13/2019   No Known Allergies     Medication List    TAKE these medications   levothyroxine 100 MCG tablet Commonly known as: Synthroid Take 1 tablet (100 mcg total) by mouth daily before breakfast.   multivitamin-prenatal 27-0.8 MG Tabs tablet Take 1 tablet by mouth daily.       Diet: routine diet  Activity: Advance as tolerated. Pelvic rest for 6 weeks.   Outpatient follow up: Follow-up Information    Nadara Mustard, MD. Schedule an appointment as soon as possible for a visit in 6 week(s).   Specialty: Obstetrics and Gynecology Contact information: [address] [place] [address] [phone]             Postpartum contraception: IUD Mirena Rhogam Given postpartum: no Rubella vaccine given postpartum: no Varicella vaccine given postpartum: no TDaP given antepartum or postpartum: Yes  Newborn Data: Live born female Shea Evans Birth Weight:  3820 g APGAR: 8, 9  Newborn Delivery   Birth date/time: 09/12/2019 08:04:00 Delivery type: Vaginal, Vacuum (Extractor)       Baby Feeding: Breast  Disposition:home with mother  SIGNED: Tresea Mall, CNM 09/13/2019 11:21 AM

## 2019-09-12 NOTE — Telephone Encounter (Signed)
Called and left voice mail for patient to call back to be schedule °

## 2019-09-12 NOTE — Lactation Note (Signed)
This note was copied from a baby's chart. Lactation Consultation Note  Patient Name: Olivia Henderson VVOHY'W Date: 09/12/2019 Reason for consult: Initial assessment;Difficult latch;Term  LC called in to assist with mom and baby's first breastfeeding. Transition RN had an attempt, and reports large breasts with flat nipples.  Baby was skin to skin with mom in cradle position, displaying early hunger cues, but not upset. LC discussed with mom positioning and efforts to attempt latch without tools first. Mom has history providing EBM to her first baby, now 50 years old, but was unable to ever achieve a latch. LC assisted mom with putting baby in cross cradle position, with LC supporting baby's back and base of head/neck. LC held breast tissue in tea cup hold and elicited a wide open mouth from baby. Baby was able to grasp the breast for 2-3 sucks before popping off, multiple tries, unsuccessful at maintaining a latch for long periods.  LC demonstrated and taught hand expression on opposite breast to reassure mom of colostrum available to baby. LC introduced a size 64mm Nipple shield, and feels both mom and baby would benefit from use due to nipple being flat on both left and right breasts. LC demonstrated and educated mom on how to apply the nipple shield dependent upon position being used. LC placed nipple shield and assisted with holding of breast tissue and supporting of baby, baby was able to open mouth wide and grasp breast easily and immediately began strong rhythmic sucking sustaining the latch. Baby breastfed for approximately 10 minutes, with colostrum evident in shield. LC assisted in helping place nipple shield and move baby to right breast, baby again able to achieve latch easily with shield, had strong rhythmic suck pattern, and breastfed for approximately 12 minutes, with the last 3-4 minutes popping on/off, appearing agitated, colostrum again evident in the shield. Baby moved skin to skin,  appearing unsettled, diaper checked and was dirty, meconium, LC changed diaper and brought baby back skin to skin where she settled down. LC reviewed breastfeeding basics, newborn stomach size, feeding patterns, early hunger cues, signs of fullness, wet/stool diapers, benefits of skin to skin, and LC support. Encouraged to call with any questions/concerns or for breastfeeding assistance.  Maternal Data Formula Feeding for Exclusion: No Has patient been taught Hand Expression?: Yes Does the patient have breastfeeding experience prior to this delivery?: Yes  Feeding Feeding Type: Breast Fed  LATCH Score Latch: Grasps breast easily, tongue down, lips flanged, rhythmical sucking.  Audible Swallowing: A few with stimulation  Type of Nipple: Flat  Comfort (Breast/Nipple): Soft / non-tender  Hold (Positioning): Full assist, staff holds infant at breast  LATCH Score: 6  Interventions Interventions: Breast feeding basics reviewed;Assisted with latch;Skin to skin;Breast massage;Hand express;Adjust position;Support pillows  Lactation Tools Discussed/Used Tools: Nipple Shields Nipple shield size: 20   Consult Status Consult Status: Follow-up Date: 09/12/19 Follow-up type: In-patient    Lavonia Drafts 09/12/2019, 11:29 AM

## 2019-09-12 NOTE — Progress Notes (Signed)
Postpartum Note in L&D   CTSP due to tachycardia. VAVD complicated by chorioamnionitis (given Mefoxin 2 gm IV around 0726 this AM.) and EBL>1000 ml. Pulse has been in the 120s-140s postpartum. She has a low grade temperature of 100.2 AX. Bleeding has been appropriate with a firm fundus. She received the 500 ml bolus of Pitocin and is now on 62.5 ml/hr  Exam: General: alert, awake, sitting up holding baby. Denies lightheadedness when sitting up Currently pulse is 125 and BP 125/79. Pulse O2 96% Heart: tachycardic, but no murmurs heard Had Tylenol 650 mgm at 0723 and Motrin 600 mgm now  A: Tachycardia, possible due to low grade fever and increased blood loss with delivery  P: Bolus 500 ml LR then continue at 100 ml/hr CBC at 1200 noon Will monitor fever-if low grade fever persists will consider continuing antibiotics  Dalia Heading, CNM

## 2019-09-12 NOTE — Progress Notes (Signed)
RN notified CNM at 1017 about concern for elevated maternal pulse in 120-140s since delivery. Notified of elevated maternal pulse starting around 0500 per documentation in chart and throughout pushing and postpartum, but RN states concern that it is still elevated. Notified CNM of temp 100.8 at 0700 taken by night shift RN axillary. Told CNM that I had been taking temp orally since delivery but noted night shift RN had taken temp axillary, so I took temp orally and axillary. Temp orally at 0933 was 98.8. Temp axillary at 0940 was 100.2. These values were communicated to the CNM. She stated that she would come see pt and assess.   CNM in room at 1032 to assess pt. Order given to begin continuous pulse oximetry, retake temp, start LR bolus (500 mL) and continue LR as maintenance fluid after bolus completed. Order given to collect CBC at 1200. All interventions done- see flowsheets and MAR.

## 2019-09-12 NOTE — Telephone Encounter (Signed)
-----   Message from Gae Dry, MD sent at 09/12/2019  8:35 AM EST ----- Regarding: appt Change appt from 2 week post op (no lomger needed) to 6 week post partum w [place]

## 2019-09-12 NOTE — Lactation Note (Signed)
This note was copied from a baby's chart. Lactation Consultation Note  Patient Name: Olivia Henderson JMEQA'S Date: 09/12/2019 Reason for consult: Follow-up assessment  LC and [facility] intern to check on mom and baby Warren Lacy once moved to [place]. Mom reports feeling well, recovering ok. She has attempted a feed with baby 1x, but states baby was sleepy and uninterested. Mom is interested in pumping, and has a history of pumping with her older child, and fears low supply again with this baby. She is leaking colostrum now, and would prefer collection instead of wasting. LC and [facility] intern praised mom for her continued dedication to giving baby best start with colostrum, and revisited hand expression into collection container, mom's preference is to pump at this time. [facility] intern assisted with pump set-up and explanation of pump use, pump part assembly, cleaning, milk storage, and paced bottle feeding; with option of spoon/syringe feeding given as well.  LC advised mom to continue with feeding on cues, reviewing newborn feeding patterns, stomach sizes, feeding frequency and early cues, and pumping on routine of every 3 hours for additional stimulation, and onset of milk production. Mom and dad educated on cluster feeding, signs of milk transfer, and feeding expectations over night. Encouraged to call out with any questions or concerns, or for breastfeeding assistance as needed.  Maternal Data Formula Feeding for Exclusion: No Has patient been taught Hand Expression?: Yes Does the patient have breastfeeding experience prior to this delivery?: Yes  Feeding    LATCH Score                   Interventions Interventions: Breast feeding basics reviewed;Coconut oil;DEBP  Lactation Tools Discussed/Used Pump Review: Setup, frequency, and cleaning;Milk Storage Initiated by:: Marcell Anger, MPH, IBCLC Date initiated:: 09/12/19   Consult Status Consult Status: Follow-up Date: 09/12/19 Follow-up type:  In-patient    Lavonia Drafts 09/12/2019, 4:02 PM

## 2019-09-12 NOTE — Plan of Care (Signed)
Reviewed plan of care with pt. All questions answered. Will monitor closely. 

## 2019-09-12 NOTE — Progress Notes (Signed)
Spoke to CNM about maternal pulse in 110-120s and pulse oximetry WNL. Spoke to CNM about bleeding being WNL. CNM states she is okay with pt going to M/B.

## 2019-09-13 DIAGNOSIS — Z20828 Contact with and (suspected) exposure to other viral communicable diseases: Secondary | ICD-10-CM | POA: Diagnosis not present

## 2019-09-13 DIAGNOSIS — O26893 Other specified pregnancy related conditions, third trimester: Secondary | ICD-10-CM | POA: Diagnosis not present

## 2019-09-13 DIAGNOSIS — O99284 Endocrine, nutritional and metabolic diseases complicating childbirth: Secondary | ICD-10-CM | POA: Diagnosis not present

## 2019-09-13 DIAGNOSIS — O411230 Chorioamnionitis, third trimester, not applicable or unspecified: Secondary | ICD-10-CM | POA: Diagnosis not present

## 2019-09-13 DIAGNOSIS — O4292 Full-term premature rupture of membranes, unspecified as to length of time between rupture and onset of labor: Secondary | ICD-10-CM | POA: Diagnosis not present

## 2019-09-13 DIAGNOSIS — E039 Hypothyroidism, unspecified: Secondary | ICD-10-CM | POA: Diagnosis not present

## 2019-09-13 DIAGNOSIS — R000 Tachycardia, unspecified: Secondary | ICD-10-CM | POA: Diagnosis not present

## 2019-09-13 DIAGNOSIS — O99214 Obesity complicating childbirth: Secondary | ICD-10-CM | POA: Diagnosis not present

## 2019-09-13 LAB — CBC
HCT: 23.6 % — ABNORMAL LOW (ref 36.0–46.0)
Hemoglobin: 8.3 g/dL — ABNORMAL LOW (ref 12.0–15.0)
MCH: 32.8 pg (ref 26.0–34.0)
MCHC: 35.2 g/dL (ref 30.0–36.0)
MCV: 93.3 fL (ref 80.0–100.0)
Platelets: 124 10*3/uL — ABNORMAL LOW (ref 150–400)
RBC: 2.53 MIL/uL — ABNORMAL LOW (ref 3.87–5.11)
RDW: 13.3 % (ref 11.5–15.5)
WBC: 10.6 10*3/uL — ABNORMAL HIGH (ref 4.0–10.5)
nRBC: 0 % (ref 0.0–0.2)

## 2019-09-13 LAB — ABO/RH: ABO/RH(D): O POS

## 2019-09-13 NOTE — Lactation Note (Signed)
This note was copied from a baby's chart. Lactation Consultation Note  Patient Name: Olivia Henderson Today's Date: 09/13/2019     Maternal Data  Mom states she is pumping breasts and getting 15-30 mls each session, feeds this to baby by bottle, encouraged that it is normal to get different amts each time, has been attempting nursing with nipple shield at some feedings, plans on attempting more at home, I offered help with latch but she does not desire help at this time, I gave her another 20 mm nipple shield to take home and a [facility] nipple shield instruction sheet   Feeding Feeding Type: Breast Milk Nipple Type: Slow - flow  LATCH Score                   Interventions    Lactation Tools Discussed/Used  has UMR pump at home   Consult Status      Ferol Luz 09/13/2019, 12:14 PM

## 2019-09-13 NOTE — Progress Notes (Signed)
Discharge order received from doctor. Reviewed discharge instructions and prescriptions with patient and answered all questions. Follow up appointment instructions given. Patient verbalized understanding. ID bands checked. Patient discharged home with infant via wheelchair by nursing/auxillary.    Mekhai Venuto Garner, RN  

## 2019-09-13 NOTE — Telephone Encounter (Signed)
Patient is schedule 10/23/19 

## 2019-09-13 NOTE — Discharge Instructions (Signed)

## 2019-09-13 NOTE — Anesthesia Postprocedure Evaluation (Signed)
Anesthesia Post Note  Patient: Olivia Henderson  Procedure(s) Performed: AN AD [name]  Patient location during evaluation: L&D Anesthesia Type: General Level of consciousness: awake, oriented and awake and alert Pain management: pain level controlled Vital Signs Assessment: post-procedure vital signs reviewed and stable Respiratory status: spontaneous breathing, nonlabored ventilation and respiratory function stable Cardiovascular status: blood pressure returned to baseline and stable Postop Assessment: no headache and no backache Anesthetic complications: no     Last Vitals:  Vitals:   09/12/19 2309 09/13/19 0319  BP: (!) 100/59 104/68  Pulse: 89 89  Resp: 18 16  Temp: 36.6 C 36.6 C  SpO2: 99% 98%    Last Pain:  Vitals:   09/13/19 0600  TempSrc:   PainSc: 5                  Proofreader

## 2019-09-17 ENCOUNTER — Encounter: Payer: Self-pay | Admitting: Obstetrics & Gynecology

## 2019-09-17 NOTE — Telephone Encounter (Signed)
As discussed, OK to monitor for now and continue comfort measures. May try compression socks. We would not use a diuretic for this.

## 2019-09-17 NOTE — Telephone Encounter (Signed)
Can you look at this since Rph Is not here?

## 2019-10-08 ENCOUNTER — Ambulatory Visit: Payer: Self-pay | Admitting: Internal Medicine

## 2019-10-14 DIAGNOSIS — Z3482 Encounter for supervision of other normal pregnancy, second trimester: Secondary | ICD-10-CM | POA: Diagnosis not present

## 2019-10-14 DIAGNOSIS — Z3483 Encounter for supervision of other normal pregnancy, third trimester: Secondary | ICD-10-CM | POA: Diagnosis not present

## 2019-10-23 ENCOUNTER — Encounter: Payer: Self-pay | Admitting: Obstetrics & Gynecology

## 2019-10-23 ENCOUNTER — Ambulatory Visit (INDEPENDENT_AMBULATORY_CARE_PROVIDER_SITE_OTHER): Payer: 59 | Admitting: Obstetrics & Gynecology

## 2019-10-23 ENCOUNTER — Other Ambulatory Visit: Payer: Self-pay

## 2019-10-23 DIAGNOSIS — Z98891 History of uterine scar from previous surgery: Secondary | ICD-10-CM | POA: Insufficient documentation

## 2019-10-23 DIAGNOSIS — E039 Hypothyroidism, unspecified: Secondary | ICD-10-CM | POA: Diagnosis not present

## 2019-10-23 DIAGNOSIS — Z30430 Encounter for insertion of intrauterine contraceptive device: Secondary | ICD-10-CM | POA: Diagnosis not present

## 2019-10-23 DIAGNOSIS — R5383 Other fatigue: Secondary | ICD-10-CM

## 2019-10-23 DIAGNOSIS — Z1389 Encounter for screening for other disorder: Secondary | ICD-10-CM

## 2019-10-23 NOTE — Progress Notes (Signed)
  OBSTETRICS POSTPARTUM CLINIC PROGRESS NOTE  Subjective:     Olivia Henderson is a 28 y.o. [id number] female who presents for a postpartum visit. She is 6 weeks postpartum following a Term pregnancy and delivery by [facility].  I have fully reviewed the prenatal and intrapartum course. Anesthesia: epidural.  Postpartum course has been complicated by uncomplicated.  Baby is feeding by Breast.  Bleeding: patient has  resumed menses.  Bowel function is normal. Bladder function is normal.  Patient is not sexually active. Contraception method desired is IUD.  Postpartum depression screening: negative. [place] 0.  The following portions of the patient's history were reviewed and updated as appropriate: allergies, current medications, past family history, past medical history, past social history, past surgical history and problem list.  Review of Systems Pertinent items are noted in HPI.  Objective:    BP 120/80   Ht 5\' 2"  (1.575 m)   Wt 186 lb (84.4 kg)   BMI 34.02 kg/m   General:  alert and no distress   Breasts:  inspection negative, no nipple discharge or bleeding, no masses or nodularity palpable  Lungs: clear to auscultation bilaterally  Heart:  regular rate and rhythm, S1, S2 normal, no murmur, click, rub or gallop  Abdomen: soft, non-tender; bowel sounds normal; no masses,  no organomegaly.     Vulva:  normal  Vagina: normal vagina, no discharge, exudate, lesion, or erythema  Cervix:  no cervical motion tenderness and no lesions  Corpus: normal size, contour, position, consistency, mobility, non-tender  Adnexa:  normal adnexa and no mass, fullness, tenderness  Rectal Exam: Not performed.          Assessment:  Post Partum Care visit   ICD-10-CM   1. Encounter for postpartum visit  Z39.2   2. Encounter for IUD insertion  Z30.430   3. Fatigue, unspecified type  R53.83 CBC  4. Acquired hypothyroidism  E03.9 TSH    Plan:  See orders and Patient  Instructions Contraceptive counseling for IUD Resume all normal activities Follow up in: 5 weeks or as needed.   PAP planned at next visit as will be due then Labs today  Barnett Applebaum, MD, [facility], [facility] 10/23/2019  3:30 PM   IUD PROCEDURE NOTE:  Olivia Henderson is a 29 y.o. [phone] here for IUD insertion. No GYN concerns.  Last pap smear was normal.  IUD Insertion Procedure Note Patient identified, informed consent performed, consent signed.   Discussed risks of irregular bleeding, cramping, infection, malpositioning or misplacement of the IUD outside the uterus which may require further procedure such as laparoscopy, risk of failure <1%. Time out was performed.  Urine pregnancy test negative.  A bimanual exam showed the uterus to be midposition.  Speculum placed in the vagina.  Cervix visualized.  Cleaned with Betadine x 2.  Grasped anteriorly with a single tooth tenaculum.  Uterus sounded to 8 cm.   IUD placed per manufacturer's recommendations.  Strings trimmed to 3 cm. Tenaculum was removed, good hemostasis noted.  Patient tolerated procedure well.   Patient was given post-procedure instructions.  She was advised to have backup contraception for one week.  Patient was also asked to check IUD strings periodically and follow up in 4 weeks for IUD check.  Barnett Applebaum, MD, [facility], [facility] 10/23/2019  3:30 PM

## 2019-10-23 NOTE — Patient Instructions (Signed)
Intrauterine Device Insertion, Care After  This sheet gives you information about how to care for yourself after your procedure. Your health care provider may also give you more specific instructions. If you have problems or questions, contact your health care provider. What can I expect after the procedure? After the procedure, it is common to have:  Cramps and pain in the abdomen.  Light bleeding (spotting) or heavier bleeding that is like your menstrual period. This may last for up to a few days.  Lower back pain.  Dizziness.  Headaches.  Nausea. Follow these instructions at home:  Before resuming sexual activity, check to make sure that you can feel the IUD string(s). You should be able to feel the end of the string(s) below the opening of your cervix. If your IUD string is in place, you may resume sexual activity. ? If you had a hormonal IUD inserted more than 7 days after your most recent period started, you will need to use a backup method of birth control for 7 days after IUD insertion. Ask your health care provider whether this applies to you.  Continue to check that the IUD is still in place by feeling for the string(s) after every menstrual period, or once a month.  Take over-the-counter and prescription medicines only as told by your health care provider.  Do not drive or use heavy machinery while taking prescription pain medicine.  Keep all follow-up visits as told by your health care provider. This is important. Contact a health care provider if:  You have bleeding that is heavier or lasts longer than a normal menstrual cycle.  You have a fever.  You have cramps or abdominal pain that get worse or do not get better with medicine.  You develop abdominal pain that is new or is not in the same area of earlier cramping and pain.  You feel lightheaded or weak.  You have abnormal or bad-smelling discharge from your vagina.  You have pain during sexual activity.   You have any of the following problems with your IUD string(s): ? The string bothers or hurts you or your sexual partner. ? You cannot feel the string. ? The string has gotten longer.  You can feel the IUD in your vagina.  You think you may be pregnant, or you miss your menstrual period.  You think you may have an STI (sexually transmitted infection). Get help right away if:  You have flu-like symptoms.  You have a fever and chills.  You can feel that your IUD has slipped out of place. Summary  After the procedure, it is common to have cramps and pain in the abdomen. It is also common to have light bleeding (spotting) or heavier bleeding that is like your menstrual period.  Continue to check that the IUD is still in place by feeling for the string(s) after every menstrual period, or once a month.  Keep all follow-up visits as told by your health care provider. This is important.  Contact your health care provider if you have problems with your IUD string(s), such as the string getting longer or bothering you or your sexual partner. This information is not intended to replace advice given to you by your health care provider. Make sure you discuss any questions you have with your health care provider. Document Released: 06/15/2011 Document Revised: 09/29/2017 Document Reviewed: 09/07/2016 Elsevier Patient Education  2020 Elsevier Inc.  

## 2019-10-24 ENCOUNTER — Other Ambulatory Visit: Payer: Self-pay | Admitting: Obstetrics and Gynecology

## 2019-10-24 ENCOUNTER — Other Ambulatory Visit: Payer: Self-pay | Admitting: Obstetrics & Gynecology

## 2019-10-24 DIAGNOSIS — O9122 Nonpurulent mastitis associated with the puerperium: Secondary | ICD-10-CM

## 2019-10-24 DIAGNOSIS — E039 Hypothyroidism, unspecified: Secondary | ICD-10-CM

## 2019-10-24 LAB — CBC
Hematocrit: 38.4 % (ref 34.0–46.6)
Hemoglobin: 13.1 g/dL (ref 11.1–15.9)
MCH: 31.4 pg (ref 26.6–33.0)
MCHC: 34.1 g/dL (ref 31.5–35.7)
MCV: 92 fL (ref 79–97)
Platelets: 224 10*3/uL (ref 150–450)
RBC: 4.17 x10E6/uL (ref 3.77–5.28)
RDW: 12.6 % (ref 11.7–15.4)
WBC: 6.3 10*3/uL (ref 3.4–10.8)

## 2019-10-24 LAB — TSH: TSH: 0.386 u[IU]/mL — ABNORMAL LOW (ref 0.450–4.500)

## 2019-10-24 MED ORDER — CEPHALEXIN 500 MG PO TABS: 500.0000 mg | ORAL_TABLET | Freq: Four times a day (QID) | ORAL | 0 refills | Status: AC

## 2019-10-24 MED ORDER — LEVOTHYROXINE SODIUM 88 MCG PO TABS: 88.0000 ug | ORAL_TABLET | Freq: Every day | ORAL | 11 refills | Status: DC

## 2019-10-24 NOTE — Progress Notes (Signed)
Patient called the after-hours line reporting waking up this morning with right breast pain located at the upper portion of her breast.  The breast also felt engorged to her.  She had a temperature of 100.19F and took Tylenol 650 mg and has returned to a more normal temperature. She noted a small amount of erythema and a small lump in the area. She is currently pumping and had a delay in her usual schedule of pumping.  She has pumped 3-4 times today and the area has begun to feel better, though the area is still a little sore.  Discussed conservative treatment measures. Given the holiday and time of day and lack of access to non-acute care unless she goes to the ER in a time when Covid is at high incidence in the community, the plan is for me to prescribe a course of antibiotics that she will pick up.  She will continue to monitor her symptoms, and if they worsen, she will take the antibiotics.   Follow up next week, if needed.  Prentice Docker, MD, [facility], [facility] 10/24/2019 3:07 PM

## 2019-11-04 NOTE — Telephone Encounter (Signed)
Can you advise since RPH is not in the office?

## 2019-11-19 DIAGNOSIS — Z3482 Encounter for supervision of other normal pregnancy, second trimester: Secondary | ICD-10-CM | POA: Diagnosis not present

## 2019-11-19 DIAGNOSIS — Z3483 Encounter for supervision of other normal pregnancy, third trimester: Secondary | ICD-10-CM | POA: Diagnosis not present

## 2019-12-05 ENCOUNTER — Encounter: Payer: Self-pay | Admitting: Obstetrics & Gynecology

## 2019-12-05 ENCOUNTER — Ambulatory Visit (INDEPENDENT_AMBULATORY_CARE_PROVIDER_SITE_OTHER): Payer: 59 | Admitting: Obstetrics & Gynecology

## 2019-12-05 ENCOUNTER — Other Ambulatory Visit: Payer: Self-pay

## 2019-12-05 ENCOUNTER — Other Ambulatory Visit (HOSPITAL_COMMUNITY)
Admission: RE | Admit: 2019-12-05 | Discharge: 2019-12-05 | Disposition: A | Discharge status: Home / Self Care | Payer: 59 | Source: Ambulatory Visit | Attending: Obstetrics & Gynecology | Admitting: Obstetrics & Gynecology

## 2019-12-05 VITALS — BP 100/60 | Ht 61.0 in | Wt 178.0 lb

## 2019-12-05 DIAGNOSIS — Z30431 Encounter for routine checking of intrauterine contraceptive device: Secondary | ICD-10-CM | POA: Diagnosis not present

## 2019-12-05 DIAGNOSIS — Z124 Encounter for screening for malignant neoplasm of cervix: Secondary | ICD-10-CM | POA: Insufficient documentation

## 2019-12-05 DIAGNOSIS — Z01419 Encounter for gynecological examination (general) (routine) without abnormal findings: Secondary | ICD-10-CM

## 2019-12-05 NOTE — Progress Notes (Signed)
HPI:      [name] is a 29 y.o. [phone] who LMP was No LMP recorded. (Menstrual status: IUD)., she presents today for her annual examination. The patient has no complaints today. The patient is sexually active. Her last pap: approximate date 2019 and was normal. The patient does perform self breast exams.  There is no notable family history of breast or ovarian cancer in her family.  The patient has regular exercise: yes.  The patient denies current symptoms of depression.    GYN History: Contraception: IUD  PMHx: Past Medical History:  Diagnosis Date  . Anemia    with first pregnancy  . Chicken pox   . Complication of anesthesia    nausea and vomiting  . GERD (gastroesophageal reflux disease)   . Preeclampsia    with G1  . Unspecified hypothyroidism 07/10/2014   Past Surgical History:  Procedure Laterality Date  . CESAREAN SECTION  2014  . WISDOM TOOTH EXTRACTION     Family History  Problem Relation Age of Onset  . Kidney cancer Maternal Grandmother   . Heart disease Maternal Grandmother        CHF  . Diabetes Maternal Grandfather    Social History   Tobacco Use  . Smoking status: Never Smoker  . Smokeless tobacco: Never Used  Substance Use Topics  . Alcohol use: No  . Drug use: No    Current Outpatient Medications:  .  levonorgestrel (MIRENA) 20 MCG/24HR IUD, 1 each by Intrauterine route once., Disp: , Rfl:  .  levothyroxine (SYNTHROID) 88 MCG tablet, Take 1 tablet (88 mcg total) by mouth daily before breakfast., Disp: 30 tablet, Rfl: 11 .  Prenatal Vit-Fe Fumarate-FA (MULTIVITAMIN-PRENATAL) 27-0.8 MG TABS tablet, Take 1 tablet by mouth daily. , Disp: , Rfl:  Allergies: Patient has no known allergies.  Review of Systems  Constitutional: Negative for chills, fever and malaise/fatigue.  HENT: Negative for congestion, sinus pain and sore throat.   Eyes: Negative for blurred vision and pain.  Respiratory: Negative for cough and wheezing.     Cardiovascular: Negative for chest pain and leg swelling.  Gastrointestinal: Negative for abdominal pain, constipation, diarrhea, heartburn, nausea and vomiting.  Genitourinary: Negative for dysuria, frequency, hematuria and urgency.  Musculoskeletal: Negative for back pain, joint pain, myalgias and neck pain.  Skin: Negative for itching and rash.  Neurological: Negative for dizziness, tremors and weakness.  Endo/Heme/Allergies: Does not bruise/bleed easily.  Psychiatric/Behavioral: Negative for depression. The patient is not nervous/anxious and does not have insomnia.   All other systems reviewed and are negative.   Objective: BP 100/60   Ht 5\' 1"  (1.549 m)   Wt 178 lb (80.7 kg)   BMI 33.63 kg/m   Filed Weights   12/05/19 1430  Weight: 178 lb (80.7 kg)   Body mass index is 33.63 kg/m. Physical Exam Constitutional:      General: She is not in acute distress.    Appearance: She is well-developed.  Genitourinary:     Pelvic exam was performed with patient supine.     Vagina, uterus and rectum normal.     No lesions in the vagina.     No vaginal bleeding.     No cervical motion tenderness, friability, lesion or polyp.     IUD strings visualized.     Uterus is mobile.     Uterus is not enlarged.     No uterine mass detected.    Uterus is midaxial.  No right or left adnexal mass present.     Right adnexa not tender.     Left adnexa not tender.  HENT:     Head: Normocephalic and atraumatic. No laceration.     Right Ear: Hearing normal.     Left Ear: Hearing normal.     Mouth/Throat:     Pharynx: Uvula midline.  Eyes:     Pupils: Pupils are equal, round, and reactive to light.  Neck:     Thyroid: No thyromegaly.  Cardiovascular:     Rate and Rhythm: Normal rate and regular rhythm.     Heart sounds: No murmur. No friction rub. No gallop.   Pulmonary:     Effort: Pulmonary effort is normal. No respiratory distress.     Breath sounds: Normal breath sounds. No  wheezing.  Chest:     Breasts:        Right: No mass, skin change or tenderness.        Left: No mass, skin change or tenderness.  Abdominal:     General: Bowel sounds are normal. There is no distension.     Palpations: Abdomen is soft.     Tenderness: There is no abdominal tenderness. There is no rebound.  Musculoskeletal:        General: Normal range of motion.     Cervical back: Normal range of motion and neck supple.  Neurological:     Mental Status: She is alert and oriented to person, place, and time.     Cranial Nerves: No cranial nerve deficit.  Skin:    General: Skin is warm and dry.  Psychiatric:        Judgment: Judgment normal.  Vitals reviewed.     Assessment:  ANNUAL EXAM 1. Women's annual routine gynecological examination   2. Screening for cervical cancer   3. IUD check up      Screening Plan:            1.  Cervical Screening-  Pap smear done today  2. Labs UTD.  COnsider TSH recheck this year  3. Counseling for contraception: IUD  Strings in check Counseled as to its effectiveness, limitations, and use for 6 years    F/U  Return in about 1 year (around 12/04/2020) for Annual.  Barnett Applebaum, MD, [facility], [place] 12/05/2019  2:46 PM

## 2019-12-05 NOTE — Patient Instructions (Signed)
PAP every year Labs yearly (with PCP) 

## 2019-12-09 LAB — CYTOLOGY - PAP: Diagnosis: NEGATIVE

## 2019-12-13 DIAGNOSIS — Z3483 Encounter for supervision of other normal pregnancy, third trimester: Secondary | ICD-10-CM | POA: Diagnosis not present

## 2019-12-13 DIAGNOSIS — Z3482 Encounter for supervision of other normal pregnancy, second trimester: Secondary | ICD-10-CM | POA: Diagnosis not present

## 2019-12-20 ENCOUNTER — Other Ambulatory Visit: Payer: Self-pay | Admitting: Obstetrics and Gynecology

## 2019-12-20 MED ORDER — FLUCONAZOLE 150 MG PO TABS: 150.0000 mg | ORAL_TABLET | Freq: Once | ORAL | 0 refills | Status: AC

## 2019-12-20 NOTE — Telephone Encounter (Signed)
Can you send her in a rx since rph is not in the office?

## 2020-01-13 DIAGNOSIS — Z3482 Encounter for supervision of other normal pregnancy, second trimester: Secondary | ICD-10-CM | POA: Diagnosis not present

## 2020-01-13 DIAGNOSIS — Z3483 Encounter for supervision of other normal pregnancy, third trimester: Secondary | ICD-10-CM | POA: Diagnosis not present

## 2020-03-22 DIAGNOSIS — J019 Acute sinusitis, unspecified: Secondary | ICD-10-CM | POA: Diagnosis not present

## 2020-03-28 ENCOUNTER — Telehealth: Payer: 59 | Admitting: Nurse Practitioner

## 2020-03-28 DIAGNOSIS — W57XXXA Bitten or stung by nonvenomous insect and other nonvenomous arthropods, initial encounter: Secondary | ICD-10-CM

## 2020-03-28 DIAGNOSIS — R21 Rash and other nonspecific skin eruption: Secondary | ICD-10-CM | POA: Diagnosis not present

## 2020-03-28 MED ORDER — DOXYCYCLINE HYCLATE 100 MG PO TABS: 100.0000 mg | ORAL_TABLET | Freq: Two times a day (BID) | ORAL | 0 refills | Status: DC

## 2020-03-28 NOTE — Progress Notes (Signed)
Thank you for describing your tick bite, Here is how we plan to help! Based on the information that you shared with me it looks like you have A tick that bite that we will treat with a short course of doxycycline.  In most cases a tick bite is painless and does not itch.  Most tick bites in which the tick is quickly removed do not require prescriptions. Ticks can transmit several diseases if they are infected and remain attacked to your skin. Therefore the length that the tick was attached and any symptoms you have experienced after the bite are import to accurately develop your custom treatment plan. In most cases a single dose of doxycycline may prevent the development of a more serious condition.  Based on your information I have Provided a home care guide for tick bites and  instructions on when to call for help. and Your symptoms indicate that you need a longer course of antibiotics and a follow up visit with a provider. I have sent doxycycline 100 mg twice a day for 21 days to the pharmacy that you selected. You will need to schedule a follow up visit with your provider. If you do not have a primary care provider you may use our telehealth physicians on the web at [facility]/  Which ticks  are associated with illness?  The Wood Tick (dog tick) is the size of a watermelon seed and can sometimes transmit [facility] spotted fever and [place] tick fever.   The Deer Tick (black-legged tick) is between the size of a poppy seed (pin head) and an apple seed, and can sometimes transmit Lyme disease.  A brown to black tick with a white splotch on its back is likely a female Amblyomma americanum ([place] tick). This tick has been associated with Southern Tick Associated illness ( STARI)  Lyme disease has become the most common tick-borne illness in the [place]. The risk of Lyme disease following a recognized deer tick bite is estimated to be 1%.  The majority of cases of Lyme disease  start with a bull's eye rash at the site of the tick bite. The rash can occur days to weeks (typically 7-10 days) after a tick bite. Treatment with antibiotics is indicated if this rash appears. Flu-like symptoms may accompany the rash, including: fever, chills, headaches, muscle aches, and fatigue. Removing ticks promptly may prevent tick borne disease.  What can be used to prevent Tick Bites?   Insect repellant with at leas 20% DEET.  Wearing long pants with sock and shoes.  Avoiding tall grass and heavily wooded areas.  Checking your skin after being outdoors.  Shower with a washcloth after outdoor exposures.  HOME CARE ADVICE FOR TICK BITE  1. Wood Tick Removal:  o Use a pair of tweezers and grasp the wood tick close to the skin (on its head). Pull the wood tick straight upward without twisting or crushing it. Maintain a steady pressure until it releases its grip.   o If tweezers aren't available, use fingers, a loop of thread around the jaws, or a needle between the jaws for traction.  o Note: covering the tick with petroleum jelly, nail polish or rubbing alcohol doesn't work. Neither does touching the tick with a hot or cold object. 2. Tiny Deer Tick Removal:   o Needs to be scraped off with a knife blade or credit card edge. o Place tick in a sealed container (e.g. glass jar, zip lock plastic bag), in  case your doctor wants to see it. 3. Tick's Head Removal:  o If the wood tick's head breaks off in the skin, it must be removed. Clean the skin. Then use a sterile needle to uncover the head and lift it out or scrape it off.  o If a very small piece of the head remains, the skin will eventually slough it off. 4. Antibiotic Ointment:  o Wash the wound and your hands with soap and water after removal to prevent catching any tick disease.  Apply an over the counter antibiotic ointment (e.g. bacitracin) to the bite once. 5. Expected Course: Tick bites normally don't itch or hurt. That's  why they often go unnoticed. 6. Call Your Doctor If:  o You can't remove the tick or the tick's head o Fever, a severe head ache, or rash occur in the next 2 weeks o Bite begins to look infected o Lyme's disease is common in your area o You have not had a tetanus in the last 10 years o Your current symptoms become worse    MAKE SURE YOU   Understand these instructions.  Will watch your condition.  Will get help right away if you are not doing well or get worse.   Thank you for choosing an e-visit.  Your e-visit answers were reviewed by a board certified advanced clinical practitioner to complete your personal care plan. Depending upon the condition, your plan could have included both over the counter or prescription medications. Please review your pharmacy choice. If there is a problem you may use MyChart messaging to have the prescription routed to another pharmacy. Your safety is important to [place]. If you have drug allergies check your prescription carefully.   You can use MyChart to ask questions about today's visit, request a non-urgent call back, or ask for a work or school excuse for 24 hours related to this e-Visit. If it has been greater than 24 hours you will need to follow up with your provider, or enter a new e-Visit to address those concerns.  You will get an email in the next two days asking about your experience. I hope  that your e-visit has been valuable and will speed your recovery  5-10 minutes spent reviewing and documenting in chart.

## 2020-04-24 ENCOUNTER — Telehealth: Payer: 59 | Admitting: Emergency Medicine

## 2020-04-24 DIAGNOSIS — H10029 Other mucopurulent conjunctivitis, unspecified eye: Secondary | ICD-10-CM | POA: Diagnosis not present

## 2020-04-24 MED ORDER — POLYMYXIN B-TRIMETHOPRIM 10000-0.1 UNIT/ML-% OP SOLN: 1.0000 [drp] | Freq: Four times a day (QID) | OPHTHALMIC | 0 refills | Status: DC

## 2020-04-24 NOTE — Progress Notes (Signed)
E-Visit for Pink Eye   We are sorry that you are not feeling well.  Here is how we plan to help!  Based on what you have shared with me it looks like you have conjunctivitis.  Conjunctivitis is a common inflammatory or infectious condition of the eye that is often referred to as "pink eye".  In most cases it is contagious (viral or bacterial). However, not all conjunctivitis requires antibiotics (ex. Allergic).  We have made appropriate suggestions for you based upon your presentation.  I have prescribed Polytrim Ophthalmic drops 1-2 drops 4 times a day times 5 days  Pink eye can be highly contagious.  It is typically spread through direct contact with secretions, or contaminated objects or surfaces that one may have touched.  Strict handwashing is suggested with soap and water is urged.  If not available, use alcohol based had sanitizer.  Avoid unnecessary touching of the eye.  If you wear contact lenses, you will need to refrain from wearing them until you see no white discharge from the eye for at least 24 hours after being on medication.  You should see symptom improvement in 1-2 days after starting the medication regimen.  Call us if symptoms are not improved in 1-2 days.  Home Care:  Wash your hands often!  Do not wear your contacts until you complete your treatment plan.  Avoid sharing towels, bed linen, personal items with a person who has pink eye.  See attention for anyone in your home with similar symptoms.  Get Help Right Away If:  Your symptoms do not improve.  You develop blurred or loss of vision.  Your symptoms worsen (increased discharge, pain or redness)  Your e-visit answers were reviewed by a board certified advanced clinical practitioner to complete your personal care plan.  Depending on the condition, your plan could have included both over the counter or prescription medications.  If there is a problem please reply  once you have received a response from your  provider.  Your safety is important to us.  If you have drug allergies check your prescription carefully.    You can use MyChart to ask questions about today's visit, request a non-urgent call back, or ask for a work or school excuse for 24 hours related to this e-Visit. If it has been greater than 24 hours you will need to follow up with your provider, or enter a new e-Visit to address those concerns.   You will get an e-mail in the next two days asking about your experience.  I hope that your e-visit has been valuable and will speed your recovery. Thank you for using e-visits.     **Please do not respond to this message unless you have follow up questions.** Greater than 5 but less than 10 minutes spent researching, coordinating, and implementing care for this patient today  

## 2020-05-05 ENCOUNTER — Telehealth: Payer: 59 | Admitting: Family

## 2020-05-05 DIAGNOSIS — N760 Acute vaginitis: Secondary | ICD-10-CM

## 2020-05-05 MED ORDER — FLUCONAZOLE 150 MG PO TABS: 150.0000 mg | ORAL_TABLET | Freq: Once | ORAL | 0 refills | Status: AC

## 2020-05-05 NOTE — Progress Notes (Signed)

## 2020-05-28 ENCOUNTER — Encounter: Payer: Self-pay | Admitting: Internal Medicine

## 2020-08-12 ENCOUNTER — Encounter: Payer: Self-pay | Admitting: Internal Medicine

## 2020-08-12 ENCOUNTER — Other Ambulatory Visit: Payer: Self-pay | Admitting: Internal Medicine

## 2020-08-12 ENCOUNTER — Ambulatory Visit (INDEPENDENT_AMBULATORY_CARE_PROVIDER_SITE_OTHER): Payer: 59 | Admitting: Internal Medicine

## 2020-08-12 ENCOUNTER — Other Ambulatory Visit: Payer: Self-pay

## 2020-08-12 VITALS — BP 126/82 | HR 90 | Temp 98.4°F | Ht 62.6 in | Wt 192.4 lb

## 2020-08-12 DIAGNOSIS — Z1389 Encounter for screening for other disorder: Secondary | ICD-10-CM

## 2020-08-12 DIAGNOSIS — E669 Obesity, unspecified: Secondary | ICD-10-CM

## 2020-08-12 DIAGNOSIS — Z13220 Encounter for screening for lipoid disorders: Secondary | ICD-10-CM

## 2020-08-12 DIAGNOSIS — Z0000 Encounter for general adult medical examination without abnormal findings: Secondary | ICD-10-CM

## 2020-08-12 DIAGNOSIS — E611 Iron deficiency: Secondary | ICD-10-CM

## 2020-08-12 DIAGNOSIS — E039 Hypothyroidism, unspecified: Secondary | ICD-10-CM

## 2020-08-12 DIAGNOSIS — E559 Vitamin D deficiency, unspecified: Secondary | ICD-10-CM | POA: Diagnosis not present

## 2020-08-12 MED ORDER — NALTREXONE-BUPROPION HCL ER 8-90 MG PO TB12: ORAL_TABLET | ORAL | 0 refills | Status: DC

## 2020-08-12 NOTE — Patient Instructions (Addendum)
The Next 56 Days Nutrition   Bupropion; Naltrexone extended-release tablets What is this medicine? BUPROPION; NALTREXONE (byoo PROE pee on; nal TREX one) is a combination of two drugs that help you lose weight. This product is used with a reduced calorie diet and exercise. This product can also help you maintain weight loss. This medicine may be used for other purposes; ask your health care provider or pharmacist if you have questions. COMMON BRAND NAME(S): Contrave What should I tell my health care provider before I take this medicine? They need to know if you have any of these conditions:  an eating disorder, such as anorexia or bulimia  diabetes  depression  glaucoma  head injury  heart disease  high blood pressure  history of drug abuse or alcohol abuse problem  history of a tumor or infection of your brain or spine  history of heart attack or stroke  history of irregular heartbeat  if you often drink alcohol  kidney disease  liver disease  low levels of sodium in the blood  mental illness  seizures  suicidal thoughts, plans, or attempt; a previous suicide attempt by you or a family member  taken an MAOI like Carbex, Eldepryl, Marplan, Nardil, or Parnate in last 14 days  an unusual or allergic reaction to bupropion, naltrexone, other medicines, foods, dyes, or preservatives  breast-feeding  pregnant or trying to become pregnant How should I use this medicine? Take this medicine by mouth with a glass of water. Follow the directions on the prescription label. Do not cut, crush or chew this medicine. Swallow the tablets whole. You can take it with or without food. Do not take with high-fat meals as this may increase your risk of seizures. Take your medicine at regular intervals. Do not take it more often than directed. Do not stop taking except on your doctor's advice. A special MedGuide will be given to you by the pharmacist with each prescription and refill.  Be sure to read this information carefully each time. Talk to your pediatrician regarding the use of this medicine in children. Special care may be needed. Overdosage: If you think you have taken too much of this medicine contact a poison control center or emergency room at once. NOTE: This medicine is only for you. Do not share this medicine with others. What if I miss a dose? If you miss a dose, skip it. Take your next dose at the normal time. Do not take extra or 2 doses at the same time to make up for the missed dose. What may interact with this medicine? Do not take this medicine with any of the following medications:  any medicines used to stop taking opioids such as methadone or buprenorphine  linezolid  MAOIs like Carbex, Eldepryl, Marplan, Nardil, and Parnate  methylene blue (injected into a vein)  often take narcotic medicines for pain or cough  other medicines that contain bupropion like Zyban or Wellbutrin This medicine may also interact with the following medications:  alcohol  certain medicines for blood pressure like metoprolol, propranolol  certain medicines for depression, anxiety, or psychotic disturbances  certain medicines for HIV or hepatitis  certain medicines for irregular heart beat like propafenone, flecainide  certain medicines for Parkinson's disease like amantadine, levodopa  certain medicines for seizures like carbamazepine, phenytoin, phenobarbital  certain medicines for sleep  cimetidine  clopidogrel  cyclophosphamide  digoxin  disulfiram  furazolidone  isoniazid  nicotine  orphenadrine  procarbazine  steroid medicines like prednisone or  cortisone  stimulant medicines for attention disorders, weight loss, or to stay awake  tamoxifen  theophylline  thiotepa  ticlopidine  tramadol  warfarin This list may not describe all possible interactions. Give your health care provider a list of all the medicines, herbs,  non-prescription drugs, or dietary supplements you use. Also tell them if you smoke, drink alcohol, or use illegal drugs. Some items may interact with your medicine. What should I watch for while using this medicine? Visit your doctor or healthcare provider for regular checks on your progress. This medicine may cause serious skin reactions. They can happen weeks to months after starting the medicine. Contact your healthcare provider right away if you notice fevers or flu-like symptoms with a rash. The rash may be red or purple and then turn into blisters or peeling of the skin. Or, you might notice a red rash with swelling of the face, lips or lymph nodes in your neck or under your arms. This medicine may affect blood sugar. Ask your healthcare provider if changes in diet or medicines are needed if you have diabetes. Patients and their families should watch out for new or worsening depression or thoughts of suicide. Also watch out for sudden changes in feelings such as feeling anxious, agitated, panicky, irritable, hostile, aggressive, impulsive, severely restless, overly excited and hyperactive, or not being able to sleep. If this happens, especially at the beginning of treatment or after a change in dose, call your healthcare provider. Avoid alcoholic drinks while taking this medicine. Drinking large amounts of alcoholic beverages, using sleeping or anxiety medicines, or quickly stopping the use of these agents while taking this medicine may increase your risk for a seizure. Do not drive or use heavy machinery until you know how this medicine affects you. This medicine can impair your ability to perform these tasks. Women should inform their health care provider if they wish to become pregnant or think they might be pregnant. Losing weight while pregnant is not advised and may cause harm to the unborn child. Talk to your health care provider for more information. What side effects may I notice from  receiving this medicine? Side effects that you should report to your doctor or health care professional as soon as possible:  allergic reactions like skin rash, itching or hives, swelling of the face, lips, or tongue  breathing problems  changes in vision  confusion  elevated mood, decreased need for sleep, racing thoughts, impulsive behavior  fast or irregular heartbeat  hallucinations, loss of contact with reality  increased blood pressure  rash, fever, and swollen lymph nodes  redness, blistering, peeling, or loosening of the skin, including inside the mouth  seizures  signs and symptoms of liver injury like dark yellow or brown urine; general ill feeling or flu-like symptoms; light-colored stools; loss of appetite; nausea; right upper belly pain; unusually weak or tired; yellowing of the eyes or skin  suicidal thoughts or other mood changes  vomiting Side effects that usually do not require medical attention (report to your doctor or health care professional if they continue or are bothersome):  constipation  headache  loss of appetite  indigestion, stomach upset  tremors This list may not describe all possible side effects. Call your doctor for medical advice about side effects. You may report side effects to FDA at 1-800-FDA-1088. Where should I keep my medicine? Keep out of the reach of children. Store at room temperature between 15 and 30 degrees C (59 and 86 degrees F).  Throw away any unused medicine after the expiration date. NOTE: This sheet is a summary. It may not cover all possible information. If you have questions about this medicine, talk to your doctor, pharmacist, or health care provider.  2020 Elsevier/Gold Standard (2019-08-23 95:28:41)

## 2020-08-12 NOTE — Progress Notes (Signed)
Chief Complaint  Patient presents with   Annual Exam   Annual doing well 1. Obesity since had baby fall 2020 trying to lose weight and wants to try Contrave doing wt watchers now  adipex in the past made her jittery 2. Hypothyroidism c/o fatigue at times hand and leg swelling and brittle hair    Review of Systems  Constitutional: Positive for malaise/fatigue. Negative for weight loss.  HENT: Negative for hearing loss.   Eyes: Negative for blurred vision.  Respiratory: Negative for shortness of breath.   Cardiovascular: Positive for leg swelling. Negative for chest pain.  Gastrointestinal: Positive for abdominal pain.  Musculoskeletal: Negative for joint pain.  Skin: Negative for rash.  Neurological: Negative for headaches.  Psychiatric/Behavioral: Negative for depression.   Past Medical History:  Diagnosis Date   Anemia    with first pregnancy   Chicken pox    Complication of anesthesia    nausea and vomiting   GERD (gastroesophageal reflux disease)    Preeclampsia    with G1   Unspecified hypothyroidism 07/10/2014   Past Surgical History:  Procedure Laterality Date   CESAREAN SECTION  2014   WISDOM TOOTH EXTRACTION     Family History  Problem Relation Age of Onset   Kidney cancer Maternal Grandmother    Heart disease Maternal Grandmother        CHF   Diabetes Maternal Grandfather    Social History   Socioeconomic History   Marital status: Married    Spouse name: Not on file   Number of children: Not on file   Years of education: Not on file   Highest education level: Not on file  Occupational History   Not on file  Tobacco Use   Smoking status: Never Smoker   Smokeless tobacco: Never Used  Vaping Use   Vaping Use: Never used  Substance and Sexual Activity   Alcohol use: No   Drug use: No   Sexual activity: Not Currently    Birth control/protection: None    Comment: planning merina  Other Topics Concern   Not on file  Social  History Narrative   Married    First menstrual cycle: 14 yrs   1 son 895 y.o    1 daughter   [facility]       Social Determinants of Health   Financial Resource Strain:    Difficulty of Paying Living Expenses: Not on file  Food Insecurity:    Worried About Programme researcher, broadcasting/film/videounning Out of Food in the Last Year: Not on file   [employer] Out of Food in the Last Year: Not on file  Transportation Needs:    Lack of Transportation (Medical): Not on file   Lack of Transportation (Non-Medical): Not on file  Physical Activity:    Days of Exercise per Week: Not on file   Minutes of Exercise per Session: Not on file  Stress:    Feeling of Stress : Not on file  Social Connections:    Frequency of Communication with Friends and Family: Not on file   Frequency of Social Gatherings with Friends and Family: Not on file   Attends Religious Services: Not on file   Active Member of Clubs or Organizations: Not on file   Attends BankerClub or Organization Meetings: Not on file   Marital Status: Not on file  Intimate Partner Violence:    Fear of Current or Ex-Partner: Not on file   Emotionally Abused: Not on file   Physically Abused: Not on  file   Sexually Abused: Not on file   Current Meds  Medication Sig   levonorgestrel (MIRENA) 20 MCG/24HR IUD 1 each by Intrauterine route once.   levothyroxine (SYNTHROID) 88 MCG tablet Take 1 tablet (88 mcg total) by mouth daily before breakfast.   Allergies  Allergen Reactions   Adipex-P [Phentermine]     Jittery   Recent Results (from the past 2160 hour(s))  T4, free     Status: None   Collection Time: 08/13/20  5:17 AM  Result Value Ref Range   Free T4 0.98 0.61 - 1.12 ng/dL    Comment: (NOTE) Biotin ingestion may interfere with free T4 tests. If the results are inconsistent with the TSH level, previous test results, or the clinical presentation, then consider biotin interference. If needed, order repeat testing after stopping biotin. Performed at [facility], [address] [place], [place] [address]   Ferritin     Status: None   Collection Time: 08/13/20  5:17 AM  Result Value Ref Range   Ferritin 71 11 - 307 ng/mL    Comment: Performed at [facility], [address] [place], [place] [address]  Iron and TIBC([facility])     Status: Abnormal   Collection Time: 08/13/20  5:17 AM  Result Value Ref Range   Iron 91 28 - 170 ug/dL   TIBC 244 010 - 272 ug/dL   Saturation Ratios 32 (H) 10.4 - 31.8 %   UIBC 195 ug/dL    Comment: Performed at [facility], [address] [place], [place] [address]  CBC with Differential/Platelet     Status: Abnormal   Collection Time: 08/13/20  5:17 AM  Result Value Ref Range   WBC 5.9 4.0 - 10.5 K/uL   RBC 4.21 3.87 - 5.11 MIL/uL   Hemoglobin 14.0 12.0 - 15.0 g/dL   HCT 40.3 36 - 46 %   MCV 90.3 80.0 - 100.0 fL   MCH 33.3 26.0 - 34.0 pg   MCHC 36.8 (H) 30.0 - 36.0 g/dL   RDW 47.4 25.9 - 56.3 %   Platelets 162 150 - 400 K/uL   nRBC 0.0 0.0 - 0.2 %   Neutrophils Relative % 62 %   Neutro Abs 3.7 1.7 - 7.7 K/uL   Lymphocytes Relative 29 %   Lymphs Abs 1.7 0.7 - 4.0 K/uL   Monocytes Relative 7 %   Monocytes Absolute 0.4 0.1 - 1.0 K/uL   Eosinophils Relative 1 %   Eosinophils Absolute 0.0 0.0 - 0.5 K/uL   Basophils Relative 1 %   Basophils Absolute 0.0 0.0 - 0.1 K/uL   Immature Granulocytes 0 %   Abs Immature Granulocytes 0.01 0.00 - 0.07 K/uL    Comment: Performed at [facility], [address] [place], [place] [address]  TSH     Status: Abnormal   Collection Time: 08/13/20  5:17 AM  Result Value Ref Range   TSH 5.701 (H) 0.350 - 4.500 uIU/mL    Comment: Performed by a 3rd Generation assay with a functional sensitivity of <=0.01 uIU/mL. Performed at [facility], [address] [place], [place] [address]   Lipid panel     Status: Abnormal   Collection Time: 08/13/20  5:17 AM  Result Value Ref Range   Cholesterol 181 0 - 200 mg/dL    Triglycerides 188 (H) <150 mg/dL   HDL 33 (L) >41 mg/dL   Total CHOL/HDL Ratio 5.5 RATIO   VLDL 36 0 -  40 mg/dL   LDL Cholesterol 967 (H) 0 - 99 mg/dL    Comment:        Total Cholesterol/HDL:CHD Risk Coronary Heart Disease Risk Table                     Men   Women  1/2 Average Risk   3.4   3.3  Average Risk       5.0   4.4  2 X Average Risk   9.6   7.1  3 X Average Risk  23.4   11.0        Use the calculated Patient Ratio above and the CHD Risk Table to determine the patient's CHD Risk.        ATP III CLASSIFICATION (LDL):  <100     mg/dL   Optimal  893-810  mg/dL   Near or Above                    Optimal  130-159  mg/dL   Borderline  175-102  mg/dL   High  >585     mg/dL   Very High Performed at [facility], [address] [place], [place] [address]   Comprehensive metabolic panel     Status: Abnormal   Collection Time: 08/13/20  5:17 AM  Result Value Ref Range   Sodium 138 135 - 145 mmol/L   Potassium 4.0 3.5 - 5.1 mmol/L   Chloride 105 98 - 111 mmol/L   CO2 23 22 - 32 mmol/L   Glucose, Bld 100 (H) 70 - 99 mg/dL    Comment: Glucose reference range applies only to samples taken after fasting for at least 8 hours.   BUN 14 6 - 20 mg/dL   Creatinine, Ser 4.23 0.44 - 1.00 mg/dL   Calcium 9.3 8.9 - 53.6 mg/dL   Total Protein 7.7 6.5 - 8.1 g/dL   Albumin 4.8 3.5 - 5.0 g/dL   AST 31 15 - 41 U/L   ALT 38 0 - 44 U/L   Alkaline Phosphatase 89 38 - 126 U/L   Total Bilirubin 0.9 0.3 - 1.2 mg/dL   GFR, Estimated >14 >43 mL/min   Anion gap 10 5 - 15    Comment: Performed at [facility], [address] [place], [place] [address]  Urinalysis, Routine w reflex microscopic     Status: Abnormal   Collection Time: 08/13/20  6:17 AM  Result Value Ref Range   Color, Urine YELLOW (A) YELLOW   APPearance HAZY (A) CLEAR   Specific Gravity, Urine 1.023 1.005 - 1.030   pH 5.0 5.0 - 8.0   Glucose, UA NEGATIVE NEGATIVE mg/dL   Hgb urine dipstick NEGATIVE  NEGATIVE   Bilirubin Urine NEGATIVE NEGATIVE   Ketones, ur NEGATIVE NEGATIVE mg/dL   Protein, ur NEGATIVE NEGATIVE mg/dL   Nitrite NEGATIVE NEGATIVE   Leukocytes,Ua NEGATIVE NEGATIVE    Comment: Performed at [facility], [address] [place], [place] [address]   Objective  Body mass index is 34.52 kg/m. Wt Readings from Last 3 Encounters:  08/12/20 192 lb 6.4 oz (87.3 kg)  12/05/19 178 lb (80.7 kg)  10/23/19 186 lb (84.4 kg)   Temp Readings from Last 3 Encounters:  08/12/20 98.4 F (36.9 C) (Oral)  09/13/19 98.1 F (36.7 C) (Oral)  09/08/19 98.7 F (37.1 C) (Oral)   BP Readings from Last 3 Encounters:  08/12/20 126/82  12/05/19 100/60  10/23/19 120/80  Pulse Readings from Last 3 Encounters:  08/12/20 90  09/13/19 88  09/08/19 80    Physical Exam Vitals and nursing note reviewed.  Constitutional:      Appearance: Normal appearance. She is well-developed and well-groomed. She is obese.  HENT:     Head: Normocephalic and atraumatic.  Eyes:     Conjunctiva/sclera: Conjunctivae normal.     Pupils: Pupils are equal, round, and reactive to light.  Cardiovascular:     Rate and Rhythm: Normal rate and regular rhythm.     Heart sounds: Normal heart sounds. No murmur heard.   Pulmonary:     Effort: Pulmonary effort is normal.     Breath sounds: Normal breath sounds.  Abdominal:     General: Abdomen is flat. Bowel sounds are normal.     Tenderness: There is no abdominal tenderness.  Skin:    General: Skin is warm and dry.  Neurological:     General: No focal deficit present.     Mental Status: She is alert and oriented to person, place, and time. Mental status is at baseline.     Gait: Gait normal.  Psychiatric:        Attention and Perception: Attention and perception normal.        Mood and Affect: Mood and affect normal.        Speech: Speech normal.        Behavior: Behavior normal. Behavior is cooperative.        Thought Content: Thought  content normal.        Cognition and Memory: Cognition and memory normal.        Judgment: Judgment normal.     Assessment  Plan  Annual physical exam - Plan: Comprehensive metabolic panel, Lipid panel, TSH, Urinalysis, Routine w reflex microscopic, CBC with Differential/Platelet Flu shot utd  Tdap utd Pfizer 1/2 06/19/20 sch 08/14/20   Pap had 10/2018 with out 10/2018 pregnant 09/12/2019 then IUD intact  12/05/19 pap negative mammo age 3  Colonoscopy age 50  Declines hep C rec healthy diet and exercise   Hypothyroidism, unspecified type - Plan: TSH, T4, free Cont levo 88 mcg for now   Obesity (BMI 30-39.9) - Plan: Naltrexone-buPROPion HCl ER 8-90 MG TB12  Verify not breast feeding   Provider: Dr. [place] Ana McLean-Scocuzza-Internal Medicine

## 2020-08-13 ENCOUNTER — Other Ambulatory Visit
Admission: RE | Admit: 2020-08-13 | Discharge: 2020-08-13 | Disposition: A | Discharge status: Home / Self Care | Payer: 59 | Attending: Internal Medicine | Admitting: Internal Medicine

## 2020-08-13 ENCOUNTER — Encounter: Payer: Self-pay | Admitting: Internal Medicine

## 2020-08-13 ENCOUNTER — Telehealth: Payer: Self-pay | Admitting: Internal Medicine

## 2020-08-13 ENCOUNTER — Other Ambulatory Visit: Payer: Self-pay | Admitting: Internal Medicine

## 2020-08-13 DIAGNOSIS — Z1389 Encounter for screening for other disorder: Secondary | ICD-10-CM | POA: Diagnosis not present

## 2020-08-13 DIAGNOSIS — Z13220 Encounter for screening for lipoid disorders: Secondary | ICD-10-CM | POA: Insufficient documentation

## 2020-08-13 DIAGNOSIS — Z0000 Encounter for general adult medical examination without abnormal findings: Secondary | ICD-10-CM | POA: Insufficient documentation

## 2020-08-13 DIAGNOSIS — E039 Hypothyroidism, unspecified: Secondary | ICD-10-CM | POA: Diagnosis not present

## 2020-08-13 DIAGNOSIS — E611 Iron deficiency: Secondary | ICD-10-CM | POA: Diagnosis not present

## 2020-08-13 DIAGNOSIS — E559 Vitamin D deficiency, unspecified: Secondary | ICD-10-CM | POA: Insufficient documentation

## 2020-08-13 DIAGNOSIS — E669 Obesity, unspecified: Secondary | ICD-10-CM | POA: Insufficient documentation

## 2020-08-13 LAB — URINALYSIS, ROUTINE W REFLEX MICROSCOPIC
Bilirubin Urine: NEGATIVE
Glucose, UA: NEGATIVE mg/dL
Hgb urine dipstick: NEGATIVE
Ketones, ur: NEGATIVE mg/dL
Leukocytes,Ua: NEGATIVE
Nitrite: NEGATIVE
Protein, ur: NEGATIVE mg/dL
Specific Gravity, Urine: 1.023 (ref 1.005–1.030)
pH: 5 (ref 5.0–8.0)

## 2020-08-13 LAB — T4, FREE: Free T4: 0.98 ng/dL (ref 0.61–1.12)

## 2020-08-13 LAB — TSH: TSH: 5.701 u[IU]/mL — ABNORMAL HIGH (ref 0.350–4.500)

## 2020-08-13 LAB — COMPREHENSIVE METABOLIC PANEL
ALT: 38 U/L (ref 0–44)
AST: 31 U/L (ref 15–41)
Albumin: 4.8 g/dL (ref 3.5–5.0)
Alkaline Phosphatase: 89 U/L (ref 38–126)
Anion gap: 10 (ref 5–15)
BUN: 14 mg/dL (ref 6–20)
CO2: 23 mmol/L (ref 22–32)
Calcium: 9.3 mg/dL (ref 8.9–10.3)
Chloride: 105 mmol/L (ref 98–111)
Creatinine, Ser: 0.83 mg/dL (ref 0.44–1.00)
GFR, Estimated: >60 mL/min (ref >60)
Glucose, Bld: 100 mg/dL — ABNORMAL HIGH (ref 70–99)
Potassium: 4 mmol/L (ref 3.5–5.1)
Sodium: 138 mmol/L (ref 135–145)
Total Bilirubin: 0.9 mg/dL (ref 0.3–1.2)
Total Protein: 7.7 g/dL (ref 6.5–8.1)

## 2020-08-13 LAB — CBC WITH DIFFERENTIAL/PLATELET
Abs Immature Granulocytes: 0.01 10*3/uL (ref 0.00–0.07)
Basophils Absolute: 0 10*3/uL (ref 0.0–0.1)
Basophils Relative: 1 %
Eosinophils Absolute: 0 10*3/uL (ref 0.0–0.5)
Eosinophils Relative: 1 %
HCT: 38 % (ref 36.0–46.0)
Hemoglobin: 14 g/dL (ref 12.0–15.0)
Immature Granulocytes: 0 %
Lymphocytes Relative: 29 %
Lymphs Abs: 1.7 10*3/uL (ref 0.7–4.0)
MCH: 33.3 pg (ref 26.0–34.0)
MCHC: 36.8 g/dL — ABNORMAL HIGH (ref 30.0–36.0)
MCV: 90.3 fL (ref 80.0–100.0)
Monocytes Absolute: 0.4 10*3/uL (ref 0.1–1.0)
Monocytes Relative: 7 %
Neutro Abs: 3.7 10*3/uL (ref 1.7–7.7)
Neutrophils Relative %: 62 %
Platelets: 162 10*3/uL (ref 150–400)
RBC: 4.21 MIL/uL (ref 3.87–5.11)
RDW: 12.5 % (ref 11.5–15.5)
WBC: 5.9 10*3/uL (ref 4.0–10.5)
nRBC: 0 % (ref 0.0–0.2)

## 2020-08-13 LAB — LIPID PANEL
Cholesterol: 181 mg/dL (ref 0–200)
HDL: 33 mg/dL — ABNORMAL LOW (ref >40)
LDL Cholesterol: 112 mg/dL — ABNORMAL HIGH (ref 0–99)
Total CHOL/HDL Ratio: 5.5 RATIO
Triglycerides: 182 mg/dL — ABNORMAL HIGH (ref <150)
VLDL: 36 mg/dL (ref 0–40)

## 2020-08-13 LAB — IRON AND TIBC
Iron: 91 ug/dL (ref 28–170)
Saturation Ratios: 32 % — ABNORMAL HIGH (ref 10.4–31.8)
TIBC: 286 ug/dL (ref 250–450)
UIBC: 195 ug/dL

## 2020-08-13 LAB — VITAMIN D 25 HYDROXY (VIT D DEFICIENCY, FRACTURES): Vit D, 25-Hydroxy: 21.51 ng/mL — ABNORMAL LOW (ref 30–100)

## 2020-08-13 LAB — FERRITIN: Ferritin: 71 ng/mL (ref 11–307)

## 2020-08-13 NOTE — Telephone Encounter (Signed)
Olivia Henderson are you breastfeeding if so Contrave is not recommended  Please let me know

## 2020-08-13 NOTE — Telephone Encounter (Signed)
Left message to return call.  Message was also sent to Patient via mychart

## 2020-08-17 ENCOUNTER — Other Ambulatory Visit: Payer: Self-pay | Admitting: *Deleted

## 2020-08-17 MED ORDER — VITAMIN D-3 125 MCG (5000 UT) PO TABS: 1.0000 | ORAL_TABLET | Freq: Every day | ORAL | 0 refills | Status: DC

## 2020-08-21 ENCOUNTER — Other Ambulatory Visit: Payer: Self-pay | Admitting: Internal Medicine

## 2020-08-21 DIAGNOSIS — E039 Hypothyroidism, unspecified: Secondary | ICD-10-CM

## 2020-08-21 MED ORDER — LEVOTHYROXINE SODIUM 100 MCG PO TABS: 100.0000 ug | ORAL_TABLET | Freq: Every day | ORAL | 3 refills | Status: DC

## 2020-08-31 ENCOUNTER — Other Ambulatory Visit: Payer: Self-pay | Admitting: Family

## 2020-08-31 ENCOUNTER — Telehealth: Payer: 59 | Admitting: Family

## 2020-08-31 DIAGNOSIS — J069 Acute upper respiratory infection, unspecified: Secondary | ICD-10-CM

## 2020-08-31 MED ORDER — FLUTICASONE PROPIONATE 50 MCG/ACT NA SUSP: 2.0000 | Freq: Every day | NASAL | 6 refills | Status: DC

## 2020-08-31 MED ORDER — BENZONATATE 100 MG PO CAPS: 100.0000 mg | ORAL_CAPSULE | Freq: Three times a day (TID) | ORAL | 0 refills | Status: DC | PRN

## 2020-08-31 NOTE — Progress Notes (Signed)
We are sorry you are not feeling well.  Here is how we plan to help!  Based on what you have shared with me, it looks like you may have a viral upper respiratory infection.  Upper respiratory infections are caused by a large number of viruses; however, rhinovirus is the most common cause.   Given your current symptoms you also need to be tested for COVID to rule out.   Symptoms vary from person to person, with common symptoms including sore throat, cough, fatigue or lack of energy and feeling of general discomfort.  A low-grade fever of up to 100.4 may present, but is often uncommon.  Symptoms vary however, and are closely related to a person's age or underlying illnesses.  The most common symptoms associated with an upper respiratory infection are nasal discharge or congestion, cough, sneezing, headache and pressure in the ears and face.  These symptoms usually persist for about 3 to 10 days, but can last up to 2 weeks.  It is important to know that upper respiratory infections do not cause serious illness or complications in most cases.    Upper respiratory infections can be transmitted from person to person, with the most common method of transmission being a person's hands.  The virus is able to live on the skin and can infect other persons for up to 2 hours after direct contact.  Also, these can be transmitted when someone coughs or sneezes; thus, it is important to cover the mouth to reduce this risk.  To keep the spread of the illness at bay, good hand hygiene is very important.  This is an infection that is most likely caused by a virus. There are no specific treatments other than to help you with the symptoms until the infection runs its course.  We are sorry you are not feeling well.  Here is how we plan to help!   For nasal congestion, you may use an oral decongestants such as Mucinex D or if you have glaucoma or high blood pressure use plain Mucinex.  Saline nasal spray or nasal drops can  help and can safely be used as often as needed for congestion.  For your congestion, I have prescribed Fluticasone nasal spray one spray in each nostril twice a day.  If you do not have a history of heart disease, hypertension, diabetes or thyroid disease, prostate/bladder issues or glaucoma, you may also use Sudafed to treat nasal congestion.  It is highly recommended that you consult with a pharmacist or your primary care physician to ensure this medication is safe for you to take.     If you have a cough, you may use cough suppressants such as Delsym and Robitussin.  If you have glaucoma or high blood pressure, you can also use Coricidin HBP.   For cough I have prescribed for you A prescription cough medication called Tessalon Perles 100 mg. You may take 1-2 capsules every 8 hours as needed for cough.  If you have a sore or scratchy throat, use a saltwater gargle-  to  teaspoon of salt dissolved in a 4-ounce to 8-ounce glass of warm water.  Gargle the solution for approximately 15-30 seconds and then spit.  It is important not to swallow the solution.  You can also use throat lozenges/cough drops and Chloraseptic spray to help with throat pain or discomfort.  Warm or cold liquids can also be helpful in relieving throat pain.  For headache, pain or general discomfort, you can use  Ibuprofen or Tylenol as directed.   Some authorities believe that zinc sprays or the use of Echinacea may shorten the course of your symptoms.   HOME CARE . Only take medications as instructed by your medical team. . Be sure to drink plenty of fluids. Water is fine as well as fruit juices, sodas and electrolyte beverages. You may want to stay away from caffeine or alcohol. If you are nauseated, try taking small sips of liquids. How do you know if you are getting enough fluid? Your urine should be a pale yellow or almost colorless. . Get rest. . Taking a steamy shower or using a humidifier may help nasal congestion and  ease sore throat pain. You can place a towel over your head and breathe in the steam from hot water coming from a faucet. . Using a saline nasal spray works much the same way. . Cough drops, hard candies and sore throat lozenges may ease your cough. . Avoid close contacts especially the very young and the elderly . Cover your mouth if you cough or sneeze . Always remember to wash your hands.   GET HELP RIGHT AWAY IF: . You develop worsening fever. . If your symptoms do not improve within 10 days . You develop yellow or green discharge from your nose over 3 days. . You have coughing fits . You develop a severe head ache or visual changes. . You develop shortness of breath, difficulty breathing or start having chest pain . Your symptoms persist after you have completed your treatment plan  MAKE SURE YOU   Understand these instructions.  Will watch your condition.  Will get help right away if you are not doing well or get worse.  Your e-visit answers were reviewed by a board certified advanced clinical practitioner to complete your personal care plan. Depending upon the condition, your plan could have included both over the counter or prescription medications. Please review your pharmacy choice. If there is a problem, you may call our nursing hot line at and have the prescription routed to another pharmacy. Your safety is important to [place]. If you have drug allergies check your prescription carefully.   You can use MyChart to ask questions about today's visit, request a non-urgent call back, or ask for a work or school excuse for 24 hours related to this e-Visit. If it has been greater than 24 hours you will need to follow up with your provider, or enter a new e-Visit to address those concerns. You will get an e-mail in the next two days asking about your experience.  I hope that your e-visit has been valuable and will speed your recovery. Thank you for using e-visits.  Approximately 5  minutes was spent documenting and reviewing patient's chart.

## 2020-09-03 ENCOUNTER — Encounter: Payer: Self-pay | Admitting: Nurse Practitioner

## 2020-09-03 ENCOUNTER — Telehealth (INDEPENDENT_AMBULATORY_CARE_PROVIDER_SITE_OTHER): Payer: 59 | Admitting: Nurse Practitioner

## 2020-09-03 ENCOUNTER — Other Ambulatory Visit: Payer: Self-pay | Admitting: Nurse Practitioner

## 2020-09-03 ENCOUNTER — Other Ambulatory Visit: Payer: Self-pay

## 2020-09-03 VITALS — Ht 62.6 in | Wt 182.0 lb

## 2020-09-03 DIAGNOSIS — J0100 Acute maxillary sinusitis, unspecified: Secondary | ICD-10-CM | POA: Diagnosis not present

## 2020-09-03 DIAGNOSIS — K0889 Other specified disorders of teeth and supporting structures: Secondary | ICD-10-CM | POA: Insufficient documentation

## 2020-09-03 HISTORY — DX: Acute maxillary sinusitis, unspecified: J01.00

## 2020-09-03 MED ORDER — FLUCONAZOLE 150 MG PO TABS: ORAL_TABLET | ORAL | 1 refills | Status: DC

## 2020-09-03 MED ORDER — AMOXICILLIN-POT CLAVULANATE 875-125 MG PO TABS: 1.0000 | ORAL_TABLET | Freq: Two times a day (BID) | ORAL | 0 refills | Status: DC

## 2020-09-03 MED ORDER — GUAIFENESIN ER 600 MG PO TB12: 1200.0000 mg | ORAL_TABLET | Freq: Two times a day (BID) | ORAL | 0 refills | Status: AC

## 2020-09-03 MED ORDER — SACCHAROMYCES BOULARDII 250 MG PO CAPS: 250.0000 mg | ORAL_CAPSULE | Freq: Two times a day (BID) | ORAL | 0 refills | Status: AC

## 2020-09-03 NOTE — Patient Instructions (Addendum)
You have progressive sinus pressure/pain and headache. Suspect a sinus infection.  You have left upper toothache where you had a filling last week- please see your dentist as planned.   Begin Augmentin 875 mg 1 tablet twice daily.  This will treat a sinus infection as well as a dental infection.  Begin Florastor 250 mg tablet twice a day  to help prevent diarrhea.  This puts good bacteria in the colon.  I have ordered Diflucan for you to have on hand at the pharmacy if you need it. You report a history of vaginal yeast infections when you take an antibiotic.You do not  need to take it if you do not develop vaginal yeast symptoms.  For your sinus pressure that is extremely packed in, I would recommend normal saline nasal spray followed by over-the-counter Afrin use as directed but for only 3 days.  This is a local decongestant that should help open up your sinuses.    You would then later that day follow with Flonase which is an anti-inflammatory nasal spray.  I have ordered plain Mucinex for you to take twice daily.  Drink plenty of fluids to help thin secretions.  You may take Tylenol or Advil as directed for headache.  If symptoms do not improve over the next 3 days-please seek in person evaluation and acute care.  Contact a health care provider if:  You have a fever.  Your symptoms get worse.  Your symptoms do not improve within 10 days. Get help right away if:  You have a severe headache.  You have persistent vomiting.  You have severe pain or swelling around your face or eyes.  You have vision problems.  You develop confusion.  Your neck is stiff.  You have trouble breathing.   Sinusitis, Adult Sinusitis is inflammation of your sinuses. Sinuses are hollow spaces in the bones around your face. Your sinuses are located:  Around your eyes.  In the middle of your forehead.  Behind your nose.  In your cheekbones. Mucus normally drains out of your sinuses. When  your nasal tissues become inflamed or swollen, mucus can become trapped or blocked. This allows bacteria, viruses, and fungi to grow, which leads to infection. Most infections of the sinuses are caused by a virus. Sinusitis can develop quickly. It can last for up to 4 weeks (acute) or for more than 12 weeks (chronic). Sinusitis often develops after a cold. What are the causes? This condition is caused by anything that creates swelling in the sinuses or stops mucus from draining. This includes:  Allergies.  Asthma.  Infection from bacteria or viruses.  Deformities or blockages in your nose or sinuses.  Abnormal growths in the nose (nasal polyps).  Pollutants, such as chemicals or irritants in the air.  Infection from fungi (rare). What increases the risk? You are more likely to develop this condition if you:  Have a weak body defense system (immune system).  Do a lot of swimming or diving.  Overuse nasal sprays.  Smoke. What are the signs or symptoms? The main symptoms of this condition are pain and a feeling of pressure around the affected sinuses. Other symptoms include:  Stuffy nose or congestion.  Thick drainage from your nose.  Swelling and warmth over the affected sinuses.  Headache.  Upper toothache.  A cough that may get worse at night.  Extra mucus that collects in the throat or the back of the nose (postnasal drip).  Decreased sense of smell and  taste.  Fatigue.  A fever.  Sore throat.  Bad breath. How is this diagnosed? This condition is diagnosed based on:  Your symptoms.  Your medical history.  A physical exam.  Tests to find out if your condition is acute or chronic. This may include: ? Checking your nose for nasal polyps. ? Viewing your sinuses using a device that has a light (endoscope). ? Testing for allergies or bacteria. ? Imaging tests, such as an MRI or CT scan. In rare cases, a bone biopsy may be done to rule out more serious  types of fungal sinus disease. How is this treated? Treatment for sinusitis depends on the cause and whether your condition is chronic or acute.  If caused by a virus, your symptoms should go away on their own within 10 days. You may be given medicines to relieve symptoms. They include: ? Medicines that shrink swollen nasal passages (topical intranasal decongestants). ? Medicines that treat allergies (antihistamines). ? A spray that eases inflammation of the nostrils (topical intranasal corticosteroids). ? Rinses that help get rid of thick mucus in your nose (nasal saline washes).  If caused by bacteria, your health care provider may recommend waiting to see if your symptoms improve. Most bacterial infections will get better without antibiotic medicine. You may be given antibiotics if you have: ? A severe infection. ? A weak immune system.  If caused by narrow nasal passages or nasal polyps, you may need to have surgery. Follow these instructions at home: Medicines  Take, use, or apply over-the-counter and prescription medicines only as told by your health care provider. These may include nasal sprays.  If you were prescribed an antibiotic medicine, take it as told by your health care provider. Do not stop taking the antibiotic even if you start to feel better. Hydrate and humidify   Drink enough fluid to keep your urine pale yellow. Staying hydrated will help to thin your mucus.  Use a cool mist humidifier to keep the humidity level in your home above 50%.  Inhale steam for 10-15 minutes, 3-4 times a day, or as told by your health care provider. You can do this in the bathroom while a hot shower is running.  Limit your exposure to cool or dry air. Rest  Rest as much as possible.  Sleep with your head raised (elevated).  Make sure you get enough sleep each night. General instructions   Apply a warm, moist washcloth to your face 3-4 times a day or as told by your health care  provider. This will help with discomfort.  Wash your hands often with soap and water to reduce your exposure to germs. If soap and water are not available, use hand sanitizer.  Do not smoke. Avoid being around people who are smoking (secondhand smoke).  Keep all follow-up visits as told by your health care provider. This is important. Contact a health care provider if:  You have a fever.  Your symptoms get worse.  Your symptoms do not improve within 10 days. Get help right away if:  You have a severe headache.  You have persistent vomiting.  You have severe pain or swelling around your face or eyes.  You have vision problems.  You develop confusion.  Your neck is stiff.  You have trouble breathing. Summary  Sinusitis is soreness and inflammation of your sinuses. Sinuses are hollow spaces in the bones around your face.  This condition is caused by nasal tissues that become inflamed or swollen. The  swelling traps or blocks the flow of mucus. This allows bacteria, viruses, and fungi to grow, which leads to infection.  If you were prescribed an antibiotic medicine, take it as told by your health care provider. Do not stop taking the antibiotic even if you start to feel better.  Keep all follow-up visits as told by your health care provider. This is important. This information is not intended to replace advice given to you by your health care provider. Make sure you discuss any questions you have with your health care provider. Document Revised: 03/19/2018 Document Reviewed: 03/19/2018 Elsevier Patient Education  2020 [employer].

## 2020-09-03 NOTE — Progress Notes (Signed)
Virtual Visit via Video Note  This visit type was conducted due to national recommendations for restrictions regarding the COVID-19 pandemic (e.g. social distancing).  This format is felt to be most appropriate for this patient at this time.  All issues noted in this document were discussed and addressed.  No physical exam was performed (except for noted visual exam findings with Video Visits).   I connected with@ on 09/03/20 at  9:30 AM EDT by a video enabled telemedicine application or telephone and verified that I am speaking with the correct person using two identifiers. Location patient: home Location provider: work or home office Persons participating in the virtual visit: patient, provider  I discussed the limitations, risks, security and privacy concerns of performing an evaluation and management service by telephone and the availability of in person appointments. I also discussed with the patient that there may be a patient responsible charge related to this service. The patient expressed understanding and agreed to proceed.   Reason for visit: Sinus pressure and drainage, headache, little cough, taking Mucinex and is not helping much.  Symptoms started 4 days ago.  HPI: Olivia Henderson is a 29 year old female with history of hypothyroidism, ADD, and she developed sinus headache over her forehead, maxillary sinus, with nasal congestion that is really soft and.  She has teeth pain in the upper teeth.  In addition, she had a left upper tooth filled last week, and she is having significant tooth pain.  She is seeing her dentist today.  She has noted no fevers or chills.  No body aches.  Had a little sore throat initially but that has resolved.  No ear pain.  He denies body aches, has a slight cough, but not problematic.  Cough has been dry.  No GI concerns.  She took a Covid test 2 days ago negative.  Patient works in Teacher, music.  She thinks she has a sinus infection.  She has used normal saline spray,  occasional Flonase that was just prescribed a few days ago.  She has been taking Mucinex sinus max without improvement.  She is sleeping well at night without cough. She has had no ill contacts.   ROS: See pertinent positives and negatives per HPI.  Past Medical History:  Diagnosis Date  . Anemia    with first pregnancy  . Chicken pox   . Complication of anesthesia    nausea and vomiting  . GERD (gastroesophageal reflux disease)   . Preeclampsia    with G1  . Unspecified hypothyroidism 07/10/2014    Past Surgical History:  Procedure Laterality Date  . CESAREAN SECTION  2014  . WISDOM TOOTH EXTRACTION      Family History  Problem Relation Age of Onset  . Kidney cancer Maternal Grandmother   . Heart disease Maternal Grandmother        CHF  . Diabetes Maternal Grandfather     SOCIAL HX:  Patient is a non-smoker.   Current Outpatient Medications:  .  Cholecalciferol (VITAMIN D-3) 125 MCG (5000 UT) TABS, Use as directed 1 tablet in the mouth or throat daily., Disp: 30 tablet, Rfl: 0 .  fluticasone (FLONASE) 50 MCG/ACT nasal spray, Place 2 sprays into both nostrils daily., Disp: 16 g, Rfl: 6 .  levonorgestrel (MIRENA) 20 MCG/24HR IUD, 1 each by Intrauterine route once., Disp: , Rfl:  .  levothyroxine (SYNTHROID) 100 MCG tablet, Take 1 tablet (100 mcg total) by mouth daily before breakfast., Disp: 90 tablet, Rfl: 3 .  amoxicillin-clavulanate (AUGMENTIN)  875-125 MG tablet, Take 1 tablet by mouth 2 (two) times daily for 7 days., Disp: 14 tablet, Rfl: 0 .  fluconazole (DIFLUCAN) 150 MG tablet, Take 1 pill if develop yeast symptoms. May repeat in 3 days if symptoms not resolved., Disp: 1 tablet, Rfl: 1 .  guaiFENesin (MUCINEX) 600 MG 12 hr tablet, Take 2 tablets (1,200 mg total) by mouth 2 (two) times daily for 7 days., Disp: 28 tablet, Rfl: 0 .  Naltrexone-buPROPion HCl ER 8-90 MG TB12, Daily in the am x 1 week. Week 2 1 tab bid, week 3 2 tablets in the am and 1 qhs, week 4 2 tabs  bid, Disp: 120 tablet, Rfl: 0 .  saccharomyces boulardii (FLORASTOR) 250 MG capsule, Take 1 capsule (250 mg total) by mouth 2 (two) times daily for 14 days., Disp: 28 capsule, Rfl: 0  EXAM:  VITALS per patient if applicable: Weight 182 pounds.  GENERAL: alert, oriented, appears well with obvious nasal congestion and in no acute distress  HEENT: atraumatic, conjunctiva clear, no obvious abnormalities on inspection of external nose and ears  NECK: normal movements of the head and neck  LUNGS: on inspection no signs of respiratory distress, breathing rate appears normal, no obvious gross SOB, gasping or wheezing  CV: no obvious cyanosis  MS: moves all visible extremities without noticeable abnormality  PSYCH/NEURO: pleasant and cooperative, no obvious depression or anxiety, speech and thought processing grossly intact  ASSESSMENT AND PLAN:  Discussed the following assessment and plan:  Acute non-recurrent maxillary sinusitis  Atypical toothache  Patient advised:  You have progressive sinus pressure/pain and headache. Suspect a sinus infection.  You have left upper toothache where you had a filling last week- please see your dentist as planned.   Begin Augmentin 875 mg 1 tablet twice daily.  This will treat a sinus infection as well as a dental infection.  Begin Florastor 250 mg tablet twice a day  to help prevent diarrhea.  This puts good bacteria in the colon.  I have ordered Diflucan for you to have on hand at the pharmacy if you need it. You report a history of vaginal yeast infections when you take an antibiotic.You do not  need to take it if you do not develop vaginal yeast symptoms.  For your sinus pressure that is extremely packed in, I would recommend normal saline nasal spray followed by over-the-counter Afrin use as directed but for only 3 days.  This is a local decongestant that should help open up your sinuses.    You would then later that day follow with Flonase  which is an anti-inflammatory nasal spray.  I have ordered plain Mucinex for you to take twice daily.  Drink plenty of fluids to help thin secretions.  You may take Tylenol or Advil as directed for headache.  If symptoms do not improve over the next 3 days-please seek in person evaluation and acute care.  Contact a health care provider if:  You have a fever.  Your symptoms get worse.  Your symptoms do not improve within 10 days. Get help right away if:  You have a severe headache.  You have persistent vomiting.  You have severe pain or swelling around your face or eyes.  You have vision problems.  You develop confusion.  Your neck is stiff.  You have trouble breathing.  I discussed the assessment and treatment plan with the patient. The patient was provided an opportunity to ask questions and all were answered. The patient  agreed with the plan and demonstrated an understanding of the instructions.   The patient was advised to call back or seek an in-person evaluation if the symptoms worsen or if the condition fails to improve as anticipated.  Amedeo Kinsman, NP Adult Nurse Practitioner [facility] [employer] [phone]

## 2020-09-22 ENCOUNTER — Telehealth: Payer: Self-pay | Admitting: Internal Medicine

## 2020-09-22 NOTE — Telephone Encounter (Signed)
Prior authorization has been submitted for patient's Contrave.  This has been approved

## 2020-10-19 ENCOUNTER — Other Ambulatory Visit
Admission: RE | Admit: 2020-10-19 | Discharge: 2020-10-19 | Disposition: A | Discharge status: Home / Self Care | Payer: 59 | Source: Ambulatory Visit | Attending: Internal Medicine | Admitting: Internal Medicine

## 2020-10-19 DIAGNOSIS — E039 Hypothyroidism, unspecified: Secondary | ICD-10-CM | POA: Diagnosis not present

## 2020-10-19 LAB — TSH: TSH: 1.955 u[IU]/mL (ref 0.350–4.500)

## 2020-10-20 ENCOUNTER — Telehealth: Payer: Self-pay

## 2020-10-20 NOTE — Telephone Encounter (Signed)
Left message to call back  

## 2020-10-20 NOTE — Telephone Encounter (Signed)
-----   Message from Bevelyn Buckles, MD sent at 10/19/2020  3:04 PM EST ----- Thyroid lab now normal continue same dose 100 mcg daily thyroid medication

## 2020-11-09 ENCOUNTER — Other Ambulatory Visit: Payer: Self-pay

## 2020-11-09 ENCOUNTER — Encounter: Payer: Self-pay | Admitting: Internal Medicine

## 2020-11-09 DIAGNOSIS — E669 Obesity, unspecified: Secondary | ICD-10-CM

## 2020-11-09 MED ORDER — NALTREXONE-BUPROPION HCL ER 8-90 MG PO TB12: ORAL_TABLET | ORAL | 0 refills | Status: DC

## 2020-11-12 ENCOUNTER — Ambulatory Visit: Payer: 59 | Admitting: Internal Medicine

## 2020-11-18 ENCOUNTER — Telehealth: Payer: Self-pay | Admitting: Internal Medicine

## 2020-11-18 ENCOUNTER — Encounter: Payer: Self-pay | Admitting: Internal Medicine

## 2020-11-18 ENCOUNTER — Telehealth (INDEPENDENT_AMBULATORY_CARE_PROVIDER_SITE_OTHER): Payer: 59 | Admitting: Internal Medicine

## 2020-11-18 ENCOUNTER — Other Ambulatory Visit: Payer: Self-pay

## 2020-11-18 ENCOUNTER — Other Ambulatory Visit: Payer: Self-pay | Admitting: Internal Medicine

## 2020-11-18 VITALS — Ht 62.5 in | Wt 182.0 lb

## 2020-11-18 DIAGNOSIS — J40 Bronchitis, not specified as acute or chronic: Secondary | ICD-10-CM

## 2020-11-18 DIAGNOSIS — U071 COVID-19: Secondary | ICD-10-CM

## 2020-11-18 MED ORDER — HYDROCOD POLST-CPM POLST ER 10-8 MG/5ML PO SUER: 5.0000 mL | Freq: Every evening | ORAL | 0 refills | Status: DC | PRN

## 2020-11-18 MED ORDER — DEXAMETHASONE 6 MG PO TABS: 6.0000 mg | ORAL_TABLET | Freq: Every day | ORAL | 0 refills | Status: DC

## 2020-11-18 MED ORDER — ALBUTEROL SULFATE HFA 108 (90 BASE) MCG/ACT IN AERS: 1.0000 | INHALATION_SPRAY | Freq: Four times a day (QID) | RESPIRATORY_TRACT | 0 refills | Status: DC | PRN

## 2020-11-18 NOTE — Telephone Encounter (Signed)
Pt tested positive for Covid on 11/17/20 and is having a sever cough and wanted to have something called in  Pt has a VV scheduled for 11/20/20

## 2020-11-18 NOTE — Telephone Encounter (Signed)
Can we change appt to today at 4 PM?   Thanks

## 2020-11-18 NOTE — Progress Notes (Signed)
Virtual Visit via Video Note  I connected with Olivia Henderson  on 11/18/20 at  2:15 PM EST by a video enabled telemedicine application and verified that I am speaking with the correct person using two identifiers.  Location patient: home, [place] Location provider:work or home office Persons participating in the virtual visit: patient, provider  I discussed the limitations of evaluation and management by telemedicine and the availability of in person appointments. The patient expressed understanding and agreed to proceed.   HPI: Sick visit developed covid sxs 11/15/20 after son Friday + covid 19 home test and he had fever 102.  Sunday, Monday Tuesday felt worse with resolved sinus pressure cough hard cough hurting back and rib cage tried delsym robitussin otc w/o help PCR test had 11/16/20 pending and 11/17/20 home test + she went to work whole day 1/15 and 1 hr 11/15/20 due to not feeling well    ROS: See pertinent positives and negatives per HPI.  Past Medical History:  Diagnosis Date  . Anemia    with first pregnancy  . Chicken pox   . Complication of anesthesia    nausea and vomiting  . GERD (gastroesophageal reflux disease)   . Preeclampsia    with G1  . Unspecified hypothyroidism 07/10/2014    Past Surgical History:  Procedure Laterality Date  . CESAREAN SECTION  2014  . WISDOM TOOTH EXTRACTION       Current Outpatient Medications:  .  albuterol (VENTOLIN HFA) 108 (90 Base) MCG/ACT inhaler, Inhale 1-2 puffs into the lungs every 6 (six) hours as needed for wheezing or shortness of breath., Disp: 18 g, Rfl: 0 .  chlorpheniramine-HYDROcodone (TUSSIONEX PENNKINETIC ER) 10-8 MG/5ML SUER, Take 5 mLs by mouth at bedtime as needed., Disp: 115 mL, Rfl: 0 .  Cholecalciferol (VITAMIN D-3) 125 MCG (5000 UT) TABS, Use as directed 1 tablet in the mouth or throat daily., Disp: 30 tablet, Rfl: 0 .  dexamethasone (DECADRON) 6 MG tablet, Take 1 tablet (6 mg total) by mouth daily. X 5- 10 days In  the am, Disp: 10 tablet, Rfl: 0 .  fluconazole (DIFLUCAN) 150 MG tablet, Take 1 pill if develop yeast symptoms. May repeat in 3 days if symptoms not resolved., Disp: 1 tablet, Rfl: 1 .  fluticasone (FLONASE) 50 MCG/ACT nasal spray, Place 2 sprays into both nostrils daily., Disp: 16 g, Rfl: 6 .  levonorgestrel (MIRENA) 20 MCG/24HR IUD, 1 each by Intrauterine route once., Disp: , Rfl:  .  levothyroxine (SYNTHROID) 100 MCG tablet, Take 1 tablet (100 mcg total) by mouth daily before breakfast., Disp: 90 tablet, Rfl: 3 .  Naltrexone-buPROPion HCl ER 8-90 MG TB12, Daily in the am x 1 week. Week 2 1 tab bid, week 3 2 tablets in the am and 1 qhs, week 4 2 tabs bid, Disp: 120 tablet, Rfl: 0  EXAM:  VITALS per patient if applicable:  GENERAL: alert, oriented, appears well and in no acute distress  HEENT: atraumatic, conjunttiva clear, no obvious abnormalities on inspection of external nose and ears  NECK: normal movements of the head and neck  LUNGS: on inspection no signs of respiratory distress, breathing rate appears normal, no obvious gross SOB, gasping or wheezing +cough on exam   CV: no obvious cyanosis  MS: moves all visible extremities without noticeable abnormality  PSYCH/NEURO: pleasant and cooperative, no obvious depression or anxiety, speech and thought processing grossly intact  ASSESSMENT AND PLAN:  Discussed the following assessment and plan:  COVID-19 - Plan: albuterol (  VENTOLIN HFA) 108 (90 Base) MCG/ACT inhaler, chlorpheniramine-HYDROcodone (TUSSIONEX PENNKINETIC ER) 10-8 MG/5ML SUER, dexamethasone (DECADRON) 6 MG tablet 2/2 pfizer disc booster when able in future disc with pharmacy   Bronchitis due to COVID-19 virus - Plan: albuterol (VENTOLIN HFA) 108 (90 Base) MCG/ACT inhaler, chlorpheniramine-HYDROcodone (TUSSIONEX PENNKINETIC ER) 10-8 MG/5ML SUER, dexamethasone (DECADRON) 6 MG tablet Return to work 27th if no URI sx's or 31st if not better  There is no medication other  than over the counter meds:  Mucinex dm green label for cough.  Vitamin C 1000 mg daily.  Vitamin D3 4000 Iu (units) daily.  Zinc 100 mg daily.  Quercetin 250-500 mg 2 times per day   Elderberry  Oil of oregano  cepacol or chloroseptic spray  Warm tea with honey and lemon  Hydration  Try to eat though you dont feel like it   Tylenol or Advil  Nasal saline  Flonase     Monitor pulse oximeter, buy from [place] if oxygen is less than 90 please go to the hospital.        Are you feeling really sick? Shortness of breath, cough, chest pain?, dizziness? Confusion   If so let me know  If worsening, go to hospital or [facility] clinic Urgent care for further treatment   -we discussed possible serious and likely etiologies, options for evaluation and workup, limitations of telemedicine visit vs in person visit, treatment, treatment risks and precautions.  I discussed the assessment and treatment plan with the patient. The patient was provided an opportunity to ask questions and all were answered. The patient agreed with the plan and demonstrated an understanding of the instructions.    Time spent 20 min Bevelyn Buckles, MD

## 2020-11-18 NOTE — Telephone Encounter (Signed)
Appointment hasw been scheduled.

## 2020-11-18 NOTE — Telephone Encounter (Signed)
Left detailed message asking if patient can move appointment to today @1600 . If she is ok with change we can place her on at 1600 virtually.

## 2020-11-18 NOTE — Patient Instructions (Signed)
There is no medication other than over the counter meds:  Mucinex dm green label for cough.  Vitamin C 1000 mg daily.  Vitamin D3 4000 Iu (units) daily.  Zinc 100 mg daily.  Quercetin 250-500 mg 2 times per day   Elderberry  Oil of oregano  cepacol or chloroseptic spray  Warm tea with honey and lemon  Hydration  Try to eat though you dont feel like it   Tylenol or Advil  Nasal saline  Flonase     Monitor pulse oximeter, buy from [employer] if oxygen is less than 90 please go to the hospital.        Are you feeling really sick? Shortness of breath, cough, chest pain?, dizziness? Confusion   If so let me know  If worsening, go to hospital or [facility] clinic Urgent care for further treatment   

## 2020-11-20 ENCOUNTER — Telehealth: Payer: 59 | Admitting: Internal Medicine

## 2020-12-22 ENCOUNTER — Encounter: Payer: Self-pay | Admitting: Obstetrics & Gynecology

## 2020-12-22 ENCOUNTER — Other Ambulatory Visit: Payer: Self-pay

## 2020-12-22 ENCOUNTER — Ambulatory Visit (INDEPENDENT_AMBULATORY_CARE_PROVIDER_SITE_OTHER): Payer: 59 | Admitting: Obstetrics & Gynecology

## 2020-12-22 VITALS — BP 120/80 | Ht 62.0 in | Wt 193.0 lb

## 2020-12-22 DIAGNOSIS — N920 Excessive and frequent menstruation with regular cycle: Secondary | ICD-10-CM

## 2020-12-22 NOTE — Progress Notes (Signed)
HPI:      [name] is a 30 y.o. [id number] who LMP was Patient's last menstrual period was 12/08/2020., presents today for a problem visit.  She complains of menorrhagia that  began several months ago and its severity is described as moderate.  She has regular periods every 28 days for the last 3 cycles, with the first one of these heavy and painful more so than the next two; these are the first ones since pregnancy, postpartum, breast feeding, and Mirena.  Mirena placed last year.  Stopped breast feeding last August.  No associated findings.  Has not tried other OTC meds as modifiers.    PMHx: She  has a past medical history of Anemia, Chicken pox, Complication of anesthesia, COVID-19, GERD (gastroesophageal reflux disease), Preeclampsia, and Unspecified hypothyroidism (07/10/2014). Also,  has a past surgical history that includes Cesarean section (2014) and Wisdom tooth extraction., family history includes Diabetes in her maternal grandfather; Heart disease in her maternal grandmother; Kidney cancer in her maternal grandmother.,  reports that she has never smoked. She has never used smokeless tobacco. She reports that she does not drink alcohol and does not use drugs.  She  Current Outpatient Medications:  .  albuterol (VENTOLIN HFA) 108 (90 Base) MCG/ACT inhaler, Inhale 1-2 puffs into the lungs every 6 (six) hours as needed for wheezing or shortness of breath., Disp: 18 g, Rfl: 0 .  Cholecalciferol (VITAMIN D-3) 125 MCG (5000 UT) TABS, Use as directed 1 tablet in the mouth or throat daily., Disp: 30 tablet, Rfl: 0 .  dexamethasone (DECADRON) 6 MG tablet, Take 1 tablet (6 mg total) by mouth daily. X 5- 10 days In the am, Disp: 10 tablet, Rfl: 0 .  fluticasone (FLONASE) 50 MCG/ACT nasal spray, Place 2 sprays into both nostrils daily., Disp: 16 g, Rfl: 6 .  levonorgestrel (MIRENA) 20 MCG/24HR IUD, 1 each by Intrauterine route once., Disp: , Rfl:  .  levothyroxine (SYNTHROID) 100 MCG  tablet, Take 1 tablet (100 mcg total) by mouth daily before breakfast., Disp: 90 tablet, Rfl: 3 .  Naltrexone-buPROPion HCl ER 8-90 MG TB12, Daily in the am x 1 week. Week 2 1 tab bid, week 3 2 tablets in the am and 1 qhs, week 4 2 tabs bid, Disp: 120 tablet, Rfl: 0 .  chlorpheniramine-HYDROcodone (TUSSIONEX PENNKINETIC ER) 10-8 MG/5ML SUER, Take 5 mLs by mouth at bedtime as needed. (Patient not taking: Reported on 12/22/2020), Disp: 115 mL, Rfl: 0 .  fluconazole (DIFLUCAN) 150 MG tablet, Take 1 pill if develop yeast symptoms. May repeat in 3 days if symptoms not resolved. (Patient not taking: Reported on 12/22/2020), Disp: 1 tablet, Rfl: 1  Also, is allergic to adipex-p [phentermine].  Review of Systems  Constitutional: Negative for chills, fever and malaise/fatigue.  HENT: Negative for congestion, sinus pain and sore throat.   Eyes: Negative for blurred vision and pain.  Respiratory: Negative for cough and wheezing.   Cardiovascular: Negative for chest pain and leg swelling.  Gastrointestinal: Negative for abdominal pain, constipation, diarrhea, heartburn, nausea and vomiting.  Genitourinary: Negative for dysuria, frequency, hematuria and urgency.  Musculoskeletal: Negative for back pain, joint pain, myalgias and neck pain.  Skin: Negative for itching and rash.  Neurological: Negative for dizziness, tremors and weakness.  Endo/Heme/Allergies: Does not bruise/bleed easily.  Psychiatric/Behavioral: Negative for depression. The patient is not nervous/anxious and does not have insomnia.     Objective: BP 120/80   Ht 5\' 2"  (1.575 m)  Wt 193 lb (87.5 kg)   LMP 12/08/2020   BMI 35.30 kg/m  Physical Exam Constitutional:      General: She is not in acute distress.    Appearance: She is well-developed.  Genitourinary:     Bladder, vagina and uterus normal.     No vaginal erythema or bleeding.     No vaginal prolapse present.     Right Adnexa: not tender and no mass present.    Left  Adnexa: not tender and no mass present.    No cervical motion tenderness, discharge, polyp or nabothian cyst.     IUD strings visualized.     Uterus is mobile.     Uterus is not enlarged.     No uterine mass detected.    Uterus is midaxial and midaxial.     Pelvic exam was performed with patient in the lithotomy position.  HENT:     Head: Normocephalic and atraumatic.     Nose: Nose normal.  Abdominal:     General: There is no distension.     Palpations: Abdomen is soft.     Tenderness: There is no abdominal tenderness.  Musculoskeletal:        General: Normal range of motion.  Neurological:     Mental Status: She is alert and oriented to person, place, and time.     Cranial Nerves: No cranial nerve deficit.  Skin:    General: Skin is warm and dry.  Psychiatric:        Attention and Perception: Attention normal.        Mood and Affect: Mood and affect normal.        Speech: Speech normal.        Behavior: Behavior normal.        Thought Content: Thought content normal.        Judgment: Judgment normal.     ASSESSMENT/PLAN:  Problem List Items Addressed This Visit     Menorrhagia with regular cycle        Likely related to first periods after cessation of breast feeding    Allow Mirena to provide stable hormones    Prefers this form of low maintenance birth control    She is considering lap BTL in future for permanent sterility    Consider other or additional hormone therapies if menorrhagia persists    [name], MD, [facility], [facility] 12/22/2020  4:26 PM

## 2020-12-23 ENCOUNTER — Telehealth (INDEPENDENT_AMBULATORY_CARE_PROVIDER_SITE_OTHER): Payer: 59 | Admitting: Internal Medicine

## 2020-12-23 ENCOUNTER — Encounter: Payer: Self-pay | Admitting: Internal Medicine

## 2020-12-23 ENCOUNTER — Other Ambulatory Visit: Payer: Self-pay | Admitting: Internal Medicine

## 2020-12-23 ENCOUNTER — Other Ambulatory Visit: Payer: Self-pay

## 2020-12-23 VITALS — Ht 62.0 in | Wt 182.0 lb

## 2020-12-23 DIAGNOSIS — N938 Other specified abnormal uterine and vaginal bleeding: Secondary | ICD-10-CM | POA: Diagnosis not present

## 2020-12-23 DIAGNOSIS — E669 Obesity, unspecified: Secondary | ICD-10-CM | POA: Diagnosis not present

## 2020-12-23 DIAGNOSIS — E039 Hypothyroidism, unspecified: Secondary | ICD-10-CM

## 2020-12-23 MED ORDER — QSYMIA 3.75-23 MG PO CP24: 1.0000 | ORAL_CAPSULE | Freq: Every day | ORAL | 0 refills | Status: DC

## 2020-12-23 MED ORDER — QSYMIA 7.5-46 MG PO CP24: 1.0000 | ORAL_CAPSULE | Freq: Every day | ORAL | 0 refills | Status: DC

## 2020-12-23 NOTE — Progress Notes (Signed)
Virtual Visit via Video Note  I connected with Christy Gentles   on 12/23/20 at  3:54 PM EST by a video enabled telemedicine application and verified that I am speaking with the correct person using two identifiers.  Location patient: babysitters house  Location provider:work or home office Persons participating in the virtual visit: patient, provider  I discussed the limitations of evaluation and management by telemedicine and the availability of in person appointments. The patient expressed understanding and agreed to proceed.   HPI: Obesity f/u  Was on contrave 2 pills bid but had h/a temples or generalized daily and she stopped this medication 1 week after increasing dose to 2 pills bid  No h/o migraines  Eye md appt is sch 12/24/20  Unsure if she is snoring at night  H/a have improved since stopping contrave  She is sleeping 8 hours at night and denies stress  Of note does not drink a lot of caffeine <1/2 cup per day  She wants to try qsymia as mom takes this and tolerates though in the past adipex alone made her feel jittery    DUB on IUD stopped breast feeding 05/2020 and seeing ob/gyn  Hypothyroidism controlled on levo 100 mcg   Results for EMONNIE, CANNADY (MRN [id number]) as of 12/23/2020 16:58  Ref. Range 08/13/2020 05:17 10/19/2020 09:20  TSH Latest Ref Range: 0.350 - 4.500 uIU/mL 5.701 (H) 1.955   ROS: See pertinent positives and negatives per HPI.  Past Medical History:  Diagnosis Date  . Anemia    with first pregnancy  . Chicken pox   . Complication of anesthesia    nausea and vomiting  . COVID-19    11/15/20  . GERD (gastroesophageal reflux disease)   . Preeclampsia    with G1  . Unspecified hypothyroidism 07/10/2014    Past Surgical History:  Procedure Laterality Date  . CESAREAN SECTION  2014  . WISDOM TOOTH EXTRACTION       Current Outpatient Medications:  .  Cholecalciferol (VITAMIN D-3) 125 MCG (5000 UT) TABS, Use as directed 1 tablet  in the mouth or throat daily., Disp: 30 tablet, Rfl: 0 .  fluticasone (FLONASE) 50 MCG/ACT nasal spray, Place 2 sprays into both nostrils daily., Disp: 16 g, Rfl: 6 .  levonorgestrel (MIRENA) 20 MCG/24HR IUD, 1 each by Intrauterine route once., Disp: , Rfl:  .  levothyroxine (SYNTHROID) 100 MCG tablet, Take 1 tablet (100 mcg total) by mouth daily before breakfast., Disp: 90 tablet, Rfl: 3 .  Phentermine-Topiramate (QSYMIA) 3.75-23 MG CP24, Take 1 capsule by mouth daily before breakfast. X 2 weeks, Disp: 14 capsule, Rfl: 0 .  Phentermine-Topiramate (QSYMIA) 7.5-46 MG CP24, Take 1 capsule by mouth daily before breakfast. Start week 3, Disp: 14 capsule, Rfl: 0  EXAM:  VITALS per patient if applicable:  GENERAL: alert, oriented, appears well and in no acute distress  HEENT: atraumatic, conjunttiva clear, no obvious abnormalities on inspection of external nose and ears  NECK: normal movements of the head and neck  LUNGS: on inspection no signs of respiratory distress, breathing rate appears normal, no obvious gross SOB, gasping or wheezing  CV: no obvious cyanosis  MS: moves all visible extremities without noticeable abnormality  PSYCH/NEURO: pleasant and cooperative, no obvious depression or anxiety, speech and thought processing grossly intact  ASSESSMENT AND PLAN:  Discussed the following assessment and plan:  Obesity (BMI 30-39.9) - Plan: Phentermine-Topiramate (QSYMIA) 3.75-23 MG CP24, Phentermine-Topiramate (QSYMIA) 7.5-46 MG CP24 Will continue to titrate  up if pt can tolerate  rec healthy diet and exercise   Hypothyroidism, unspecified type Levo 100 mcg qd   DUB (dysfunctional uterine bleeding) F/u ob/gyn   HM Flu shot utd  Tdap utd Pfizer 2/2  Pap had 10/2018 with out 10/2018 pregnant 09/12/2019 then IUD intact  12/05/19 pap negative Dr. Tiburcio Pea  mammo age 52  Colonoscopy age 35  Declines hep C rec healthy diet and exercise    -we discussed possible serious and  likely etiologies, options for evaluation and workup, limitations of telemedicine visit vs in person visit, treatment, treatment risks and precautions.   I discussed the assessment and treatment plan with the patient. The patient was provided an opportunity to ask questions and all were answered. The patient agreed with the plan and demonstrated an understanding of the instructions.    Time spent 20 min Bevelyn Buckles, MD

## 2020-12-23 NOTE — Patient Instructions (Signed)
Fax #  [phone]   take care

## 2020-12-23 NOTE — Progress Notes (Signed)
Patient states she thinks the Contrave may be giving her headaches.

## 2020-12-24 ENCOUNTER — Telehealth: Payer: Self-pay

## 2020-12-24 DIAGNOSIS — H52213 Irregular astigmatism, bilateral: Secondary | ICD-10-CM | POA: Diagnosis not present

## 2020-12-24 NOTE — Telephone Encounter (Signed)
lvm for pt to schedule 4-5 month f/u with PCP

## 2021-01-04 ENCOUNTER — Other Ambulatory Visit: Payer: Self-pay | Admitting: Internal Medicine

## 2021-01-04 ENCOUNTER — Encounter: Payer: Self-pay | Admitting: Internal Medicine

## 2021-01-04 ENCOUNTER — Telehealth: Payer: Self-pay | Admitting: Internal Medicine

## 2021-01-04 DIAGNOSIS — B3731 Acute candidiasis of vulva and vagina: Secondary | ICD-10-CM

## 2021-01-04 DIAGNOSIS — B373 Candidiasis of vulva and vagina: Secondary | ICD-10-CM

## 2021-01-04 MED ORDER — FLUCONAZOLE 150 MG PO TABS: 150.0000 mg | ORAL_TABLET | Freq: Once | ORAL | 0 refills | Status: DC

## 2021-01-04 NOTE — Telephone Encounter (Signed)
Ewell Poe Key: [id number] - PA Case ID: [id number] On cover my meds.

## 2021-01-04 NOTE — Telephone Encounter (Signed)
Pt needs PA for Qsymia   thanks

## 2021-01-19 NOTE — Telephone Encounter (Signed)
Prior authorization has been submitted for patient's Qsymia 3.75-23 mg.   Awaiting approval or denial.

## 2021-02-07 MED ORDER — TOPIRAMATE 50 MG PO TABS
50.0000 mg | ORAL_TABLET | Freq: Every day | ORAL | 1 refills | Status: DC
  Filled 2021-02-07: qty 90, 90d supply, fill #0

## 2021-02-07 MED ORDER — PHENTERMINE HCL 37.5 MG PO TABS
18.2500 mg | ORAL_TABLET | Freq: Every day | ORAL | 0 refills | Status: DC
  Filled 2021-02-07: qty 45, 90d supply, fill #0

## 2021-02-07 NOTE — Addendum Note (Signed)
Addended by: Quentin Ore on: 02/07/2021 11:43 PM   Modules accepted: Orders

## 2021-02-08 ENCOUNTER — Other Ambulatory Visit: Payer: Self-pay

## 2021-02-23 ENCOUNTER — Ambulatory Visit (INDEPENDENT_AMBULATORY_CARE_PROVIDER_SITE_OTHER): Payer: 59 | Admitting: Obstetrics & Gynecology

## 2021-02-23 ENCOUNTER — Encounter: Payer: Self-pay | Admitting: Obstetrics & Gynecology

## 2021-02-23 ENCOUNTER — Other Ambulatory Visit: Payer: Self-pay

## 2021-02-23 VITALS — BP 120/80 | Ht 62.0 in | Wt 192.0 lb

## 2021-02-23 DIAGNOSIS — Z01419 Encounter for gynecological examination (general) (routine) without abnormal findings: Secondary | ICD-10-CM | POA: Diagnosis not present

## 2021-02-23 NOTE — Progress Notes (Signed)
HPI:      [name] is a 30 y.o. [id number] who LMP was No LMP recorded. (Menstrual status: IUD)., she presents today for her annual examination. The patient has no complaints today. The patient is sexually active. Her last pap: was normal. The patient does perform self breast exams.  There is no notable family history of breast or ovarian cancer in her family.  The patient has regular exercise: yes.  The patient denies current symptoms of depression.    GYN History: Contraception: IUD  PMHx: Past Medical History:  Diagnosis Date  . Anemia    with first pregnancy  . Chicken pox   . Complication of anesthesia    nausea and vomiting  . COVID-19    11/15/20  . GERD (gastroesophageal reflux disease)   . Preeclampsia    with G1  . Unspecified hypothyroidism 07/10/2014   Past Surgical History:  Procedure Laterality Date  . CESAREAN SECTION  2014  . WISDOM TOOTH EXTRACTION     Family History  Problem Relation Age of Onset  . Kidney cancer Maternal Grandmother   . Heart disease Maternal Grandmother        CHF  . Diabetes Maternal Grandfather    Social History   Tobacco Use  . Smoking status: Never Smoker  . Smokeless tobacco: Never Used  Vaping Use  . Vaping Use: Never used  Substance Use Topics  . Alcohol use: No  . Drug use: No    Current Outpatient Medications:  .  Cholecalciferol (VITAMIN D-3) 125 MCG (5000 UT) TABS, Use as directed 1 tablet in the mouth or throat daily., Disp: 30 tablet, Rfl: 0 .  fluticasone (FLONASE) 50 MCG/ACT nasal spray, PLACE 2 SPRAYS INTO BOTH NOSTRILS DAILY., Disp: 16 g, Rfl: 6 .  levonorgestrel (MIRENA) 20 MCG/24HR IUD, 1 each by Intrauterine route once., Disp: , Rfl:  .  levothyroxine (SYNTHROID) 100 MCG tablet, TAKE 1 TABLET BY MOUTH DAILY BEFORE BREAKFAST, Disp: 90 tablet, Rfl: 3 .  phentermine (ADIPEX-P) 37.5 MG tablet, Take 0.5 tablets (18.75 mg total) by mouth daily before breakfast. D/c qsymia, Disp: 60 tablet, Rfl: 0 .   topiramate (TOPAMAX) 50 MG tablet, Take 1 tablet (50 mg total) by mouth daily. With Adipex 18.25 mg daily, Disp: 90 tablet, Rfl: 1 Allergies: Adipex-p [phentermine] and Contrave [naltrexone-bupropion hcl er]  Review of Systems  Constitutional: Negative for chills, fever and malaise/fatigue.  HENT: Negative for congestion, sinus pain and sore throat.   Eyes: Negative for blurred vision and pain.  Respiratory: Negative for cough and wheezing.   Cardiovascular: Negative for chest pain and leg swelling.  Gastrointestinal: Negative for abdominal pain, constipation, diarrhea, heartburn, nausea and vomiting.  Genitourinary: Negative for dysuria, frequency, hematuria and urgency.  Musculoskeletal: Positive for joint pain. Negative for back pain, myalgias and neck pain.  Skin: Negative for itching and rash.  Neurological: Negative for dizziness, tremors and weakness.  Endo/Heme/Allergies: Does not bruise/bleed easily.  Psychiatric/Behavioral: Negative for depression. The patient is not nervous/anxious and does not have insomnia.     Objective: BP 120/80   Ht 5\' 2"  (1.575 m)   Wt 192 lb (87.1 kg)   BMI 35.12 kg/m   Filed Weights   02/23/21 1518  Weight: 192 lb (87.1 kg)   Body mass index is 35.12 kg/m. Physical Exam Constitutional:      General: She is not in acute distress.    Appearance: She is well-developed.  Genitourinary:     Bladder  and rectum normal.     No lesions in the vagina.     No vaginal bleeding.     No vaginal atrophy present.     Right Adnexa: not tender and no mass present.    Left Adnexa: not tender and no mass present.    No cervical motion tenderness, friability, lesion or polyp.     IUD strings visualized.     Uterus is not enlarged.     No uterine mass detected.    Uterus is midaxial.     Pelvic exam was performed with patient in the lithotomy position.  Breasts:     Right: No mass, skin change or tenderness.     Left: No mass, skin change or  tenderness.    HENT:     Head: Normocephalic and atraumatic. No laceration.     Right Ear: Hearing normal.     Left Ear: Hearing normal.     Mouth/Throat:     Pharynx: Uvula midline.  Eyes:     Pupils: Pupils are equal, round, and reactive to light.  Neck:     Thyroid: No thyromegaly.  Cardiovascular:     Rate and Rhythm: Normal rate and regular rhythm.     Heart sounds: No murmur heard. No friction rub. No gallop.   Pulmonary:     Effort: Pulmonary effort is normal. No respiratory distress.     Breath sounds: Normal breath sounds. No wheezing.  Abdominal:     General: Bowel sounds are normal. There is no distension.     Palpations: Abdomen is soft.     Tenderness: There is no abdominal tenderness. There is no rebound.  Musculoskeletal:        General: Normal range of motion.     Cervical back: Normal range of motion and neck supple.  Neurological:     Mental Status: She is alert and oriented to person, place, and time.     Cranial Nerves: No cranial nerve deficit.  Skin:    General: Skin is warm and dry.  Psychiatric:        Judgment: Judgment normal.  Vitals reviewed.     Assessment:  ANNUAL EXAM 1. Women's annual routine gynecological examination      Screening Plan:            1.  Cervical Screening-  Pap smear schedule reviewed with patient  2. Breast screening- Exam annually and mammogram>40 planned   3. Colonoscopy every 10 years, Hemoccult testing - after age 91  4. Labs managed by PCP  5. Counseling for contraception: IUD  The pregnancy intention screening data noted above was reviewed. Potential methods of contraception were discussed. The patient elected to proceed with IUD or IUS.  No further pregnancies desired.      F/U  Return in about 1 year (around 02/23/2022) for Annual.  [name], MD, [facility], [facility] 02/23/2021  3:39 PM

## 2021-02-23 NOTE — Patient Instructions (Signed)
Thank you for choosing [facility]. As part of our ongoing efforts to improve patient experience, we would appreciate your feedback. Please fill out the short survey that you will receive by mail or MyChart. Your opinion is important to us! -Dr Starr Urias  Health Maintenance, Female Adopting a healthy lifestyle and getting preventive care are important in promoting health and wellness. Ask your health care provider about:  The right schedule for you to have regular tests and exams.  Things you can do on your own to prevent diseases and keep yourself healthy. What should I know about diet, weight, and exercise? Eat a healthy diet  Eat a diet that includes plenty of vegetables, fruits, low-fat dairy products, and lean protein.  Do not eat a lot of foods that are high in solid fats, added sugars, or sodium.   Maintain a healthy weight Body mass index (BMI) is used to identify weight problems. It estimates body fat based on height and weight. Your health care provider can help determine your BMI and help you achieve or maintain a healthy weight. Get regular exercise Get regular exercise. This is one of the most important things you can do for your health. Most adults should:  Exercise for at least 150 minutes each week. The exercise should increase your heart rate and make you sweat (moderate-intensity exercise).  Do strengthening exercises at least twice a week. This is in addition to the moderate-intensity exercise.  Spend less time sitting. Even light physical activity can be beneficial. Watch cholesterol and blood lipids Have your blood tested for lipids and cholesterol at 31 years of age, then have this test every 5 years. Have your cholesterol levels checked more often if:  Your lipid or cholesterol levels are high.  You are older than 31 years of age.  You are at high risk for heart disease. What should I know about cancer screening? Depending on your health history and family  history, you may need to have cancer screening at various ages. This may include screening for:  Breast cancer.  Cervical cancer.  Colorectal cancer.  Skin cancer.  Lung cancer. What should I know about heart disease, diabetes, and high blood pressure? Blood pressure and heart disease  High blood pressure causes heart disease and increases the risk of stroke. This is more likely to develop in people who have high blood pressure readings, are of African descent, or are overweight.  Have your blood pressure checked: ? Every 3-5 years if you are 18-39 years of age. ? Every year if you are 40 years old or older. Diabetes Have regular diabetes screenings. This checks your fasting blood sugar level. Have the screening done:  Once every three years after age 40 if you are at a normal weight and have a low risk for diabetes.  More often and at a younger age if you are overweight or have a high risk for diabetes. What should I know about preventing infection? Hepatitis B If you have a higher risk for hepatitis B, you should be screened for this virus. Talk with your health care provider to find out if you are at risk for hepatitis B infection. Hepatitis C Testing is recommended for:  Everyone born from 1945 through 1965.  Anyone with known risk factors for hepatitis C. Sexually transmitted infections (STIs)  Get screened for STIs, including gonorrhea and chlamydia, if: ? You are sexually active and are younger than 31 years of age. ? You are older than 31 years of   age and your health care provider tells you that you are at risk for this type of infection. ? Your sexual activity has changed since you were last screened, and you are at increased risk for chlamydia or gonorrhea. Ask your health care provider if you are at risk.  Ask your health care provider about whether you are at high risk for HIV. Your health care provider may recommend a prescription medicine to help prevent HIV  infection. If you choose to take medicine to prevent HIV, you should first get tested for HIV. You should then be tested every 3 months for as long as you are taking the medicine. Pregnancy  If you are about to stop having your period (premenopausal) and you may become pregnant, seek counseling before you get pregnant.  Take 400 to 800 micrograms (mcg) of folic acid every day if you become pregnant.  Ask for birth control (contraception) if you want to prevent pregnancy. Osteoporosis and menopause Osteoporosis is a disease in which the bones lose minerals and strength with aging. This can result in bone fractures. If you are 65 years old or older, or if you are at risk for osteoporosis and fractures, ask your health care provider if you should:  Be screened for bone loss.  Take a calcium or vitamin D supplement to lower your risk of fractures.  Be given hormone replacement therapy (HRT) to treat symptoms of menopause. Follow these instructions at home: Lifestyle  Do not use any products that contain nicotine or tobacco, such as cigarettes, e-cigarettes, and chewing tobacco. If you need help quitting, ask your health care provider.  Do not use street drugs.  Do not share needles.  Ask your health care provider for help if you need support or information about quitting drugs. Alcohol use  Do not drink alcohol if: ? Your health care provider tells you not to drink. ? You are pregnant, may be pregnant, or are planning to become pregnant.  If you drink alcohol: ? Limit how much you use to 0-1 drink a day. ? Limit intake if you are breastfeeding.  Be aware of how much alcohol is in your drink. In the [place] one drink equals one 12 oz bottle of beer (355 mL), one 5 oz glass of wine (148 mL), or one 1 oz glass of hard liquor (44 mL). General instructions  Schedule regular health, dental, and eye exams.  Stay current with your vaccines.  Tell your health care provider if: ? You  often feel depressed. ? You have ever been abused or do not feel safe at home. Summary  Adopting a healthy lifestyle and getting preventive care are important in promoting health and wellness.  Follow your health care provider's instructions about healthy diet, exercising, and getting tested or screened for diseases.  Follow your health care provider's instructions on monitoring your cholesterol and blood pressure. This information is not intended to replace advice given to you by your health care provider. Make sure you discuss any questions you have with your health care provider. Document Revised: 10/10/2018 Document Reviewed: 10/10/2018 Elsevier Patient Education  2021 [employer]  

## 2021-02-24 ENCOUNTER — Ambulatory Visit (INDEPENDENT_AMBULATORY_CARE_PROVIDER_SITE_OTHER): Payer: 59 | Admitting: Internal Medicine

## 2021-02-24 ENCOUNTER — Ambulatory Visit (INDEPENDENT_AMBULATORY_CARE_PROVIDER_SITE_OTHER): Payer: 59

## 2021-02-24 ENCOUNTER — Encounter: Payer: Self-pay | Admitting: Internal Medicine

## 2021-02-24 VITALS — BP 118/82 | HR 103 | Temp 98.0°F | Ht 62.0 in | Wt 191.2 lb

## 2021-02-24 DIAGNOSIS — M25562 Pain in left knee: Secondary | ICD-10-CM | POA: Diagnosis not present

## 2021-02-24 DIAGNOSIS — S83005A Unspecified dislocation of left patella, initial encounter: Secondary | ICD-10-CM

## 2021-02-24 NOTE — Patient Instructions (Addendum)
? If ortho rec. Physical therapy for strengthening  [facility] Dr. Rosita Kea   Knee Exercises Ask your health care provider which exercises are safe for you. Do exercises exactly as told by your health care provider and adjust them as directed. It is normal to feel mild stretching, pulling, tightness, or discomfort as you do these exercises. Stop right away if you feel sudden pain or your pain gets worse. Do not begin these exercises until told by your health care provider. Stretching and range-of-motion exercises These exercises warm up your muscles and joints and improve the movement and flexibility of your knee. These exercises also help to relieve pain and swelling. Knee extension, prone 1. Lie on your abdomen (prone position) on a bed. 2. Place your left / right knee just beyond the edge of the surface so your knee is not on the bed. You can put a towel under your left / right thigh just above your kneecap for comfort. 3. Relax your leg muscles and allow gravity to straighten your knee (extension). You should feel a stretch behind your left / right knee. 4. Hold this position for __________ seconds. 5. Scoot up so your knee is supported between repetitions. Repeat __________ times. Complete this exercise __________ times a day. Knee flexion, active 1. Lie on your back with both legs straight. If this causes back discomfort, bend your left / right knee so your foot is flat on the floor. 2. Slowly slide your left / right heel back toward your buttocks. Stop when you feel a gentle stretch in the front of your knee or thigh (flexion). 3. Hold this position for __________ seconds. 4. Slowly slide your left / right heel back to the starting position. Repeat __________ times. Complete this exercise __________ times a day.   Quadriceps stretch, prone 1. Lie on your abdomen on a firm surface, such as a bed or padded floor. 2. Bend your left / right knee and hold your ankle. If you cannot reach your  ankle or pant leg, loop a belt around your foot and grab the belt instead. 3. Gently pull your heel toward your buttocks. Your knee should not slide out to the side. You should feel a stretch in the front of your thigh and knee (quadriceps). 4. Hold this position for __________ seconds. Repeat __________ times. Complete this exercise __________ times a day.   Hamstring, supine 1. Lie on your back (supine position). 2. Loop a belt or towel over the ball of your left / right foot. The ball of your foot is on the walking surface, right under your toes. 3. Straighten your left / right knee and slowly pull on the belt to raise your leg until you feel a gentle stretch behind your knee (hamstring). ? Do not let your knee bend while you do this. ? Keep your other leg flat on the floor. 4. Hold this position for __________ seconds. Repeat __________ times. Complete this exercise __________ times a day. Strengthening exercises These exercises build strength and endurance in your knee. Endurance is the ability to use your muscles for a long time, even after they get tired. Quadriceps, isometric This exercise stretches the muscles in front of your thigh (quadriceps) without moving your knee joint (isometric). 1. Lie on your back with your left / right leg extended and your other knee bent. Put a rolled towel or small pillow under your knee if told by your health care provider. 2. Slowly tense the muscles in the front  of your left / right thigh. You should see your kneecap slide up toward your hip or see increased dimpling just above the knee. This motion will push the back of the knee toward the floor. 3. For __________ seconds, hold the muscle as tight as you can without increasing your pain. 4. Relax the muscles slowly and completely. Repeat __________ times. Complete this exercise __________ times a day.   Straight leg raises This exercise stretches the muscles in front of your thigh (quadriceps) and  the muscles that move your hips (hip flexors). 1. Lie on your back with your left / right leg extended and your other knee bent. 2. Tense the muscles in the front of your left / right thigh. You should see your kneecap slide up or see increased dimpling just above the knee. Your thigh may even shake a bit. 3. Keep these muscles tight as you raise your leg 4-6 inches (10-15 cm) off the floor. Do not let your knee bend. 4. Hold this position for __________ seconds. 5. Keep these muscles tense as you lower your leg. 6. Relax your muscles slowly and completely after each repetition. Repeat __________ times. Complete this exercise __________ times a day. Hamstring, isometric 1. Lie on your back on a firm surface. 2. Bend your left / right knee about __________ degrees. 3. Dig your left / right heel into the surface as if you are trying to pull it toward your buttocks. Tighten the muscles in the back of your thighs (hamstring) to "dig" as hard as you can without increasing any pain. 4. Hold this position for __________ seconds. 5. Release the tension gradually and allow your muscles to relax completely for __________ seconds after each repetition. Repeat __________ times. Complete this exercise __________ times a day. Hamstring curls If told by your health care provider, do this exercise while wearing ankle weights. Begin with __________ lb weights. Then increase the weight by 1 lb (0.5 kg) increments. Do not wear ankle weights that are more than __________ lb. 1. Lie on your abdomen with your legs straight. 2. Bend your left / right knee as far as you can without feeling pain. Keep your hips flat against the floor. 3. Hold this position for __________ seconds. 4. Slowly lower your leg to the starting position. Repeat __________ times. Complete this exercise __________ times a day.   Squats This exercise strengthens the muscles in front of your thigh and knee (quadriceps). 1. Stand in front of a  table, with your feet and knees pointing straight ahead. You may rest your hands on the table for balance but not for support. 2. Slowly bend your knees and lower your hips like you are going to sit in a chair. ? Keep your weight over your heels, not over your toes. ? Keep your lower legs upright so they are parallel with the table legs. ? Do not let your hips go lower than your knees. ? Do not bend lower than told by your health care provider. ? If your knee pain increases, do not bend as low. 3. Hold the squat position for __________ seconds. 4. Slowly push with your legs to return to standing. Do not use your hands to pull yourself to standing. Repeat __________ times. Complete this exercise __________ times a day. Wall slides This exercise strengthens the muscles in front of your thigh and knee (quadriceps). 1. Lean your back against a smooth wall or door, and walk your feet out 18-24 inches (46-61 cm) from  it. 2. Place your feet hip-width apart. 3. Slowly slide down the wall or door until your knees bend __________ degrees. Keep your knees over your heels, not over your toes. Keep your knees in line with your hips. 4. Hold this position for __________ seconds. Repeat __________ times. Complete this exercise __________ times a day.   Straight leg raises This exercise strengthens the muscles that rotate the leg at the hip and move it away from your body (hip abductors). 1. Lie on your side with your left / right leg in the top position. Lie so your head, shoulder, knee, and hip line up. You may bend your bottom knee to help you keep your balance. 2. Roll your hips slightly forward so your hips are stacked directly over each other and your left / right knee is facing forward. 3. Leading with your heel, lift your top leg 4-6 inches (10-15 cm). You should feel the muscles in your outer hip lifting. ? Do not let your foot drift forward. ? Do not let your knee roll toward the ceiling. 4. Hold  this position for __________ seconds. 5. Slowly return your leg to the starting position. 6. Let your muscles relax completely after each repetition. Repeat __________ times. Complete this exercise __________ times a day.   Straight leg raises This exercise stretches the muscles that move your hips away from the front of the pelvis (hip extensors). 1. Lie on your abdomen on a firm surface. You can put a pillow under your hips if that is more comfortable. 2. Tense the muscles in your buttocks and lift your left / right leg about 4-6 inches (10-15 cm). Keep your knee straight as you lift your leg. 3. Hold this position for __________ seconds. 4. Slowly lower your leg to the starting position. 5. Let your leg relax completely after each repetition. Repeat __________ times. Complete this exercise __________ times a day. This information is not intended to replace advice given to you by your health care provider. Make sure you discuss any questions you have with your health care provider. Document Revised: 08/07/2018 Document Reviewed: 08/07/2018 Elsevier Patient Education  2021 [employer].

## 2021-02-24 NOTE — Progress Notes (Signed)
Left knee popping in and out of the socket three times. First time was after having her second child November 2020. 2nd and 3rd time happened this year.   Patient feel at work 3 weeks ago while walking her left knee popped out of place. Patient injured the knee when she fell.   No pain today. Patient has no previous injury to this knee.

## 2021-02-24 NOTE — Progress Notes (Signed)
Chief Complaint  Patient presents with  . Knee Problem   F/u  1. Left knee pain >1 week ago knee went left and leg right and knee bone shifted to left this happened in 09/2019 after pregnancy then 6 months later than last week and she stands 8 hrs at work and stepped sideways and fell due to displacement of left knee pain 8-9/10 now 0/10 tried [employer] knee brace. She stands with knees locked and mom has knee issues as well   Review of Systems  Respiratory: Negative for shortness of breath.   Cardiovascular: Negative for chest pain.  Gastrointestinal: Negative for heartburn.  Musculoskeletal: Positive for joint pain.   Past Medical History:  Diagnosis Date  . Anemia    with first pregnancy  . Chicken pox   . Complication of anesthesia    nausea and vomiting  . COVID-19    11/15/20  . GERD (gastroesophageal reflux disease)   . Preeclampsia    with G1  . Unspecified hypothyroidism 07/10/2014   Past Surgical History:  Procedure Laterality Date  . CESAREAN SECTION  2014  . WISDOM TOOTH EXTRACTION     Family History  Problem Relation Age of Onset  . Kidney cancer Maternal Grandmother   . Heart disease Maternal Grandmother        CHF  . Diabetes Maternal Grandfather    Social History   Socioeconomic History  . Marital status: Married    Spouse name: Not on file  . Number of children: Not on file  . Years of education: Not on file  . Highest education level: Not on file  Occupational History  . Not on file  Tobacco Use  . Smoking status: Never Smoker  . Smokeless tobacco: Never Used  Vaping Use  . Vaping Use: Never used  Substance and Sexual Activity  . Alcohol use: No  . Drug use: No  . Sexual activity: Not Currently    Birth control/protection: None    Comment: planning merina  Other Topics Concern  . Not on file  Social History Narrative   Married    First menstrual cycle: 14 yrs   1 son 84 y.o    1 daughter   Lab [facility]       Social Determinants of Health    Financial Resource Strain: Not on file  Food Insecurity: Not on file  Transportation Needs: Not on file  Physical Activity: Not on file  Stress: Not on file  Social Connections: Not on file  Intimate Partner Violence: Not on file   Current Meds  Medication Sig  . Cholecalciferol (VITAMIN D-3) 125 MCG (5000 UT) TABS Use as directed 1 tablet in the mouth or throat daily.  . fluticasone (FLONASE) 50 MCG/ACT nasal spray PLACE 2 SPRAYS INTO BOTH NOSTRILS DAILY.  Marland Kitchen levonorgestrel (MIRENA) 20 MCG/24HR IUD 1 each by Intrauterine route once.  Marland Kitchen levothyroxine (SYNTHROID) 100 MCG tablet TAKE 1 TABLET BY MOUTH DAILY BEFORE BREAKFAST  . phentermine (ADIPEX-P) 37.5 MG tablet Take 0.5 tablets (18.75 mg total) by mouth daily before breakfast. D/c qsymia  . topiramate (TOPAMAX) 50 MG tablet Take 1 tablet (50 mg total) by mouth daily. With Adipex 18.25 mg daily   Allergies  Allergen Reactions  . Adipex-P [Phentermine]     Jittery  . Contrave [Naltrexone-Bupropion Hcl Er]     H/a   No results found for this or any previous visit (from the past 2160 hour(s)). Objective  Body mass index is 34.97 kg/m. Wt  Readings from Last 3 Encounters:  02/24/21 191 lb 3.2 oz (86.7 kg)  02/23/21 192 lb (87.1 kg)  12/23/20 182 lb (82.6 kg)   Temp Readings from Last 3 Encounters:  02/24/21 98 F (36.7 C) (Oral)  08/12/20 98.4 F (36.9 C) (Oral)  09/13/19 98.1 F (36.7 C) (Oral)   BP Readings from Last 3 Encounters:  02/24/21 118/82  02/23/21 120/80  12/22/20 120/80   Pulse Readings from Last 3 Encounters:  02/24/21 (!) 103  08/12/20 90  09/13/19 88    Physical Exam Vitals and nursing note reviewed.  Constitutional:      Appearance: Normal appearance. She is well-developed and well-groomed. She is obese.  HENT:     Head: Normocephalic and atraumatic.  Cardiovascular:     Rate and Rhythm: Normal rate and regular rhythm.     Heart sounds: Normal heart sounds. No murmur heard.   Pulmonary:      Effort: Pulmonary effort is normal.     Breath sounds: Normal breath sounds.  Musculoskeletal:       Legs:  Skin:    General: Skin is warm and dry.  Neurological:     General: No focal deficit present.     Mental Status: She is alert and oriented to person, place, and time. Mental status is at baseline.     Gait: Gait normal.  Psychiatric:        Attention and Perception: Attention and perception normal.        Mood and Affect: Mood and affect normal.        Speech: Speech normal.        Behavior: Behavior normal. Behavior is cooperative.        Thought Content: Thought content normal.        Cognition and Memory: Cognition and memory normal.        Judgment: Judgment normal.     Assessment  Plan  Acute pain of left knee and displacement- Plan: DG Knee Complete 4 Views Left, Ambulatory referral to Orthopedic Surgery Dr. Rosita Kea Pain resolved for now  HM Flu shot utd  Tdaputd Pfizer 2/2  Pap had 10/2018 with out 10/2018 pregnant 09/12/2019 then IUD intact  12/05/19 pap negative Dr. Tiburcio Pea  mammo age 80  Colonoscopy age 73 Declines hep C rec healthy diet and exercise  Provider: Dr. French Ana McLean-Scocuzza-[facility]

## 2021-03-01 DIAGNOSIS — S83015S Lateral dislocation of left patella, sequela: Secondary | ICD-10-CM | POA: Diagnosis not present

## 2021-03-01 DIAGNOSIS — M2392 Unspecified internal derangement of left knee: Secondary | ICD-10-CM | POA: Diagnosis not present

## 2021-03-01 DIAGNOSIS — M2202 Recurrent dislocation of patella, left knee: Secondary | ICD-10-CM | POA: Diagnosis not present

## 2021-03-01 DIAGNOSIS — M25562 Pain in left knee: Secondary | ICD-10-CM | POA: Diagnosis not present

## 2021-03-03 ENCOUNTER — Other Ambulatory Visit: Payer: Self-pay

## 2021-03-03 MED FILL — Levothyroxine Sodium Tab 100 MCG: ORAL | 90 days supply | Qty: 90 | Fill #0 | Status: AC

## 2021-03-24 ENCOUNTER — Other Ambulatory Visit: Payer: Self-pay

## 2021-03-24 ENCOUNTER — Encounter: Payer: Self-pay | Admitting: Physical Therapy

## 2021-03-24 ENCOUNTER — Ambulatory Visit: Payer: 59 | Attending: Sports Medicine | Admitting: Physical Therapy

## 2021-03-24 DIAGNOSIS — M25562 Pain in left knee: Secondary | ICD-10-CM | POA: Insufficient documentation

## 2021-03-24 DIAGNOSIS — M6281 Muscle weakness (generalized): Secondary | ICD-10-CM | POA: Insufficient documentation

## 2021-03-24 DIAGNOSIS — R262 Difficulty in walking, not elsewhere classified: Secondary | ICD-10-CM | POA: Insufficient documentation

## 2021-03-24 NOTE — Therapy (Signed)
[place] [facility] PHYSICAL AND SPORTS MEDICINE [address] [place], [address] Phone: [phone]   Fax:  [phone]  Physical Therapy Evaluation  Patient Details  Name: Olivia Henderson MRN: [id number] Date of Birth: Dec 06, 1990 Referring Provider (PT): Olivia Nettles, DO   Encounter Date: 03/24/2021   PT End of Session - 03/24/21 1618    Visit Number 1    Number of Visits 24    Date for PT Re-Evaluation 06/16/21    Authorization Type [employer] reporting period from 03/24/2021    Progress Note Due on Visit 10    PT Start Time 1520    PT Stop Time 1600    PT Time Calculation (min) 40 min    Activity Tolerance Patient tolerated treatment well    Behavior During Therapy [name] for tasks assessed/performed           Past Medical History:  Diagnosis Date  . Anemia    with first pregnancy  . Chicken pox   . Complication of anesthesia    nausea and vomiting  . COVID-19    11/15/20  . GERD (gastroesophageal reflux disease)   . Preeclampsia    with G1  . Unspecified hypothyroidism 07/10/2014    Past Surgical History:  Procedure Laterality Date  . CESAREAN SECTION  2014  . WISDOM TOOTH EXTRACTION      There were no vitals filed for this visit.    Subjective Assessment - 03/24/21 1524    Subjective Patient reports her left knee problems started just after she had her 2nd baby in 2020. Her knee popped out of joint about a week later and it went back to normal and maybe a few months ago (~4 months), her patella dislocated laterally. More recently, at work she went to take a step and her left patella dislocated and she fell. It hurt bad. This time she went to a doctor. Dr. Landry Henderson said that if PT didn't work then there is possible surgery. States her left knee doesn't really hurt much, it just feels really weak. Denies any other joint dislocations in her lift. She has a brace with a lateral bolster that Dr. Landry Henderson prescribed,  which helps a lot.    Pertinent History Patient is a 30 y.o. female who presents to outpatient physical therapy with a referral for medical diagnosis chronic pain of left knee, ligamentous laxity of left knee, lateral dislocation of lfet patella, sequela, recurrent dislocation of left patella. 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  Relevant past medical history and comorbidities include Attention deficit disorder, GERD, hypothyroidism.  Patient denies hx of cancer, stroke, seizures, lung problem, major cardiac events, diabetes, unexplained weight loss, changes in bowel or bladder problems, new onset stumbling or dropping things, osteoporosis, spine surgeries.    Limitations Standing;Walking;House hold activities;Other (comment)   nervous moving really fast at work and takes more concentration when she is walking all around, walking for fitness/leisure, squatting down, running, jumping, increased caution with how she moves with anything involving left knee.   Diagnostic tests L knee radiograph report 02/24/2021: "negative"    Patient Stated Goals to get her knee better so she doesn't have to have surgery    Currently  in Pain? No/denies    Pain Score 0-No pain   W: 5/10. 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              [facility] PT Assessment - 03/24/21 0001      Assessment   Medical Diagnosis chronic pain of left knee, ligamentous laxity of left knee, lateral dislocation of lfet patella, sequela, recurrent dislocation of left patella    Referring Provider (PT) Olivia NettlesAndrew Kubinski, DO    Onset Date/Surgical Date --   2020   Next MD Visit none scheduled    Prior Therapy none for current condition prior to current episode of care      Precautions   Precautions None      Restrictions   Weight Bearing Restrictions No      Balance Screen   Has the patient fallen in the past 6 months Yes    How many times? 1    Has the patient had a decrease in activity level because of a fear of falling?  No    Is the patient reluctant to leave their home because of a fear of falling?  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      Cognition   Overall Cognitive Status Within Functional Limits for tasks assessed      Observation/Other Assessments   Focus on Therapeutic Outcomes (FOTO)  73           OBJECTIVE  OBSERVATION/INSPECTION . Posture: bilateral genurecurvatum in standing . Lateral tracking of L patella with active extension.    PERIPHERAL JOINT MOTION (in degrees) Passive Range of Motion (PROM) *Indicates pain 03/24/21 Date Date  Joint/Motion R/L R/L R/L  Knee     Extension 10/10 / /  Flexion B  tissue approx. / /  Comments: B knees and ankles WFL   MUSCLE PERFORMANCE (MMT):  *Indicates pain 03/24/21 Date Date  Joint/Motion R/L R/L R/L  Hip     Flexion 4+/4+ / /  Extension (knee ext) 4+/4+ / /  Abduction 5/5 / /  Adduction 3+/3+ / /  Knee     Extension 5/4+ / /  Flexion 5/5 / /  Ankle/Foot     Dorsiflexion  4+/4+ / /  Great toe extension 4+/4+ / /  Eversion 5/5 / /  Plantarflexion 4+/4+ / /   SPECIAL TESTS: - L patellar apprehension test positive (static and  dynamic) - LAQ patellar tracking very lateral in last few degrees of extension on L compared to R  ACCESSORY MOTION:  - Apprehension to lateral glide at left patella with hypermobility.   PALPATION: - Not TTP around B patellae  FUNCTIONAL/BALANCE TESTS: - squat: patient allows knees to track well forward of toes, stance more narrow than ideal, mild increase in lateral patellar tracking as knee approaches extension on left compared to right.   EDUCATION/COGNITION: Patient is alert and oriented X 4.  Objective measurements completed on examination: See above findings.    TREATMENT:  Therapeutic exercise: to centralize symptoms and improve ROM, strength, muscular endurance, and activity tolerance required for successful completion of functional activities.  - L knee quad set, 1x5 with 4 second hold - L ASLR, 1x5, with 4 second hold - wall squat, 2x20 seconds - Education on HEP including handout  - Education on diagnosis, prognosis, POC, anatomy and physiology of current condition.   HOME EXERCISE PROGRAM Access Code: [email or web address] URL: [email or web address] Date: 03/24/2021 Prepared by: Norton Blizzard  Exercises Supine Active Straight Leg Raise - 1-2 x daily - 1 sets - 20 reps - 4 seconds hold Wall Squat - 1 x daily - 1 sets - 5-10 reps - 20 second hold hold    PT Education - 03/24/21 1618    Education Details Exercise purpose/form. Self management techniques. Education on diagnosis, prognosis,  POC, anatomy and physiology of current condition Education on HEP including handout    Person(s) Educated Patient    Methods Explanation;Demonstration;Tactile cues;Verbal cues;Handout    Comprehension Verbalized understanding;Returned demonstration;Verbal cues required;Tactile cues required;Need further instruction            PT Short Term Goals - 03/24/21 1751      PT SHORT TERM GOAL #1   Title Be independent with initial home exercise program for self-management of symptoms.    Baseline initial HEP provided at IE (03/24/2021);    Time 2    Period Weeks    Status New    Target Date 04/07/21             PT Long Term Goals - 03/24/21 1751      PT LONG TERM GOAL #1   Title Be independent with a long-term home exercise program for self-management of symptoms.    Baseline Initial HEP provided at IE (03/24/2021);    Time 12    Period Weeks    Status New   TARGET DATE FOR ALL LONG TERM GOALS: 06/16/2021     PT LONG TERM GOAL #2   Title Demonstrate improved FOTO to equal or greater than 83 by visit #10 to demonstrate improvement in overall condition and self-reported functional ability.    Baseline 73 (03/24/2021);    Time 12    Period Weeks    Status New      PT LONG TERM GOAL #3   Title Be able to squat to chair height with proper form without limitation due to current condition in order to improve ability to lift and complete transfers during usual activities.    Baseline patient allows knees to track well forward of toes, stance more narrow than ideal, mild increase in lateral patellar tracking as knee approaches extension on left compared to right (03/24/2021);    Time 12    Period Weeks    Status New      PT LONG TERM GOAL #4   Title Patient will score 1 or less on Lateral Step Down Test  left LE to demonstrate improved strength and LE control to help reduce risk of patellar dislocation during functional activities.    Baseline not tested (03/24/2021);    Time 12    Period  Weeks    Status New      PT LONG TERM GOAL #5   Title Complete community, work and/or recreational activities without limitation due to current condition.    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 female referred to outpatient physical therapy with a medical diagnosis of chronic pain of left knee, ligamentous laxity of left knee, lateral dislocation of lfet patella, sequela, recurrent dislocation of left patella who presents with signs and symptoms consistent with left patellar instability with history of recurrent lateral dislocations. 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 Patient will benefit from skilled physical therapy intervention to address current body structure impairments and activity limitations to improve function and work towards goals set in current POC in order to return to prior level of function or maximal functional improvement.    Personal Factors and Comorbidities Comorbidity 3+;Time since onset of injury/illness/exacerbation;Fitness;Past/Current Experience;Age    Comorbidities Relevant past medical history and comorbidities include Attention deficit disorder, GERD, hypothyroidism.    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   Stability/Clinical Decision Making Stable/Uncomplicated    Clinical Decision Making Low    Rehab Potential Good    PT Frequency 2x / week    PT Duration 12 weeks    PT Treatment/Interventions ADLs/Self Care Home Management;Cryotherapy;Moist Heat;Electrical Stimulation;Neuromuscular re-education;Balance training;Therapeutic exercise;Therapeutic activities;Patient/family education;Manual techniques;Dry needling;Joint Manipulations;Spinal Manipulations    PT Next Visit Plan hip and knee strength, proprioception exercises, update HEP as appropriate    PT Home Exercise Plan medbridge Access Code: [email or web address]    Consulted and Agree with Plan of Care Patient           Patient will benefit from skilled therapeutic intervention in order to improve the following deficits and impairments:  Improper body mechanics,Pain,Postural dysfunction,Hypermobility,Decreased mobility,Decreased activity tolerance,Decreased endurance,Decreased strength,Impaired perceived functional ability,Difficulty walking,Impaired flexibility  Visit Diagnosis: Left knee pain, unspecified chronicity  Muscle weakness (generalized)  Difficulty in walking, not elsewhere classified     Problem List Patient Active Problem List   Diagnosis Date Noted  . Acute non-recurrent maxillary sinusitis 09/03/2020  . Atypical toothache 09/03/2020  . Obesity (BMI 30-39.9) 08/13/2020  . Annual physical exam 08/12/2020  . S/P cesarean section 10/23/2019  . VBAC, delivered 09/12/2019  . ADD (attention deficit disorder) 06/03/2016  . Hypothyroidism  07/10/2014  Luretha Murphy. Ilsa Iha, PT, DPT 03/24/21, 6:08 PM  No Name [facility] PHYSICAL AND SPORTS MEDICINE [address], [place], [address] Phone: [phone]   Fax:  [phone]  Name: Olivia Henderson MRN: [id number] Date of Birth: July 14, 1991

## 2021-03-24 NOTE — Addendum Note (Signed)
Addended by: Norton Blizzard R on: 03/24/2021 06:13 PM   Modules accepted: Orders

## 2021-03-30 ENCOUNTER — Other Ambulatory Visit: Payer: Self-pay

## 2021-03-30 ENCOUNTER — Ambulatory Visit
Admission: EM | Admit: 2021-03-30 | Discharge: 2021-03-30 | Disposition: A | Discharge status: Home / Self Care | Payer: 59 | Attending: Emergency Medicine | Admitting: Emergency Medicine

## 2021-03-30 DIAGNOSIS — J069 Acute upper respiratory infection, unspecified: Secondary | ICD-10-CM | POA: Diagnosis not present

## 2021-03-30 DIAGNOSIS — J029 Acute pharyngitis, unspecified: Secondary | ICD-10-CM | POA: Diagnosis not present

## 2021-03-30 LAB — POCT RAPID STREP A (OFFICE): Rapid Strep A Screen: NEGATIVE

## 2021-03-30 NOTE — ED Provider Notes (Signed)
Renaldo Fiddler    CSN: [id number] Arrival date & time: 03/30/21  0940      History   Chief Complaint Chief Complaint  Patient presents with  . Fever    HPI Olivia Henderson is a 30 y.o. female.   Patient presents with sore throat and low-grade fever x1 day.  T-max 100.  Treatment at home with Tylenol.  She denies rash, cough, shortness of breath, vomiting, diarrhea, or other symptoms.  She reports she already has a PCR COVID pending.  Her medical history includes in January 2022, hypothyroidism, anemia, GERD.  The history is provided by the patient and medical records.    Past Medical History:  Diagnosis Date  . Anemia    with first pregnancy  . Chicken pox   . Complication of anesthesia    nausea and vomiting  . COVID-19    11/15/20  . GERD (gastroesophageal reflux disease)   . Preeclampsia    with G1  . Unspecified hypothyroidism 07/10/2014    Patient Active Problem List   Diagnosis Date Noted  . Acute non-recurrent maxillary sinusitis 09/03/2020  . Atypical toothache 09/03/2020  . Obesity (BMI 30-39.9) 08/13/2020  . Annual physical exam 08/12/2020  . S/P cesarean section 10/23/2019  . VBAC, delivered 09/12/2019  . ADD (attention deficit disorder) 06/03/2016  . Hypothyroidism 07/10/2014    Past Surgical History:  Procedure Laterality Date  . CESAREAN SECTION  2014  . WISDOM TOOTH EXTRACTION      OB History    Gravida  2   Para  2   Term  2   Preterm      AB      Living  2     SAB      IAB      Ectopic      Multiple  0   Live Births  2            Home Medications    Prior to Admission medications   Medication Sig Start Date End Date Taking? Authorizing Provider  Cholecalciferol (VITAMIN D-3) 125 MCG (5000 UT) TABS Use as directed 1 tablet in the mouth or throat daily. 08/17/20   McLean-Scocuzza, Pasty Spillers, MD  fluticasone (FLONASE) 50 MCG/ACT nasal spray PLACE 2 SPRAYS INTO BOTH NOSTRILS DAILY. 08/31/20 08/31/21   Junie Spencer, FNP  levonorgestrel (MIRENA) 20 MCG/24HR IUD 1 each by Intrauterine route once.    [provider]  levothyroxine (SYNTHROID) 100 MCG tablet TAKE 1 TABLET BY MOUTH DAILY BEFORE BREAKFAST 08/21/20 08/21/21  McLean-Scocuzza, Pasty Spillers, MD  phentermine (ADIPEX-P) 37.5 MG tablet Take 0.5 tablets (18.75 mg total) by mouth daily before breakfast. D/c qsymia 02/07/21   McLean-Scocuzza, Pasty Spillers, MD  topiramate (TOPAMAX) 50 MG tablet Take 1 tablet (50 mg total) by mouth daily. With Adipex 18.25 mg daily 02/07/21   McLean-Scocuzza, Pasty Spillers, MD  albuterol (VENTOLIN HFA) 108 (90 Base) MCG/ACT inhaler Inhale 1-2 puffs into the lungs every 6 (six) hours as needed for wheezing or shortness of breath. 11/18/20 12/23/20  McLean-Scocuzza, Pasty Spillers, MD    Family History Family History  Problem Relation Age of Onset  . Healthy Mother   . Kidney cancer Maternal Grandmother   . Heart disease Maternal Grandmother        CHF  . Diabetes Maternal Grandfather   . Hypertension Father     Social History Social History   Tobacco Use  . Smoking status: Never Smoker  . Smokeless tobacco:  Never Used  Vaping Use  . Vaping Use: Never used  Substance Use Topics  . Alcohol use: No  . Drug use: No     Allergies   Adipex-p [phentermine] and Contrave [naltrexone-bupropion hcl er]   Review of Systems Review of Systems  Constitutional: Positive for fever. Negative for chills.  HENT: Positive for sore throat. Negative for ear pain.   Respiratory: Negative for cough and shortness of breath.   Cardiovascular: Negative for chest pain and palpitations.  Gastrointestinal: Negative for abdominal pain, diarrhea and vomiting.  Skin: Negative for color change and rash.  All other systems reviewed and are negative.    Physical Exam Triage Vital Signs ED Triage Vitals  Enc Vitals Group     BP      Pulse      Resp      Temp      Temp src      SpO2      Weight      Height      Head  Circumference      Peak Flow      Pain Score      Pain Loc      Pain Edu?      Excl. in GC?    No data found.  Updated Vital Signs BP 112/78   Pulse 99   Temp 98.2 F (36.8 C)   Resp 19   SpO2 99%   Visual Acuity Right Eye Distance:   Left Eye Distance:   Bilateral Distance:    Right Eye Near:   Left Eye Near:    Bilateral Near:     Physical Exam Vitals and nursing note reviewed.  Constitutional:      General: She is not in acute distress.    Appearance: She is well-developed.  HENT:     Head: Normocephalic and atraumatic.     Right Ear: Tympanic membrane normal.     Left Ear: Tympanic membrane normal.     Nose: Nose normal.     Mouth/Throat:     Mouth: Mucous membranes are moist.     Pharynx: Posterior oropharyngeal erythema present.     Comments: Clear postnasal drip. Eyes:     Conjunctiva/sclera: Conjunctivae normal.  Cardiovascular:     Rate and Rhythm: Normal rate and regular rhythm.     Heart sounds: Normal heart sounds.  Pulmonary:     Effort: Pulmonary effort is normal. No respiratory distress.     Breath sounds: Normal breath sounds.  Abdominal:     Palpations: Abdomen is soft.     Tenderness: There is no abdominal tenderness.  Musculoskeletal:     Cervical back: Neck supple.  Skin:    General: Skin is warm and dry.     Findings: No rash.  Neurological:     General: No focal deficit present.     Mental Status: She is alert and oriented to person, place, and time.     Gait: Gait normal.  Psychiatric:        Mood and Affect: Mood normal.        Behavior: Behavior normal.      UC Treatments / Results  Labs (all labs ordered are listed, but only abnormal results are displayed) Labs Reviewed  CULTURE, GROUP A STREP [facility])  POCT RAPID STREP A (OFFICE)    EKG   Radiology No results found.  Procedures Procedures (including critical care time)  Medications Ordered in UC Medications - No data to  display  Initial Impression /  Assessment and Plan / UC Course  I have reviewed the triage vital signs and the nursing notes.  Pertinent labs & imaging results that were available during my care of the patient were reviewed by me and considered in my medical decision making (see chart for details).   Sore throat, viral URI.  Rapid strep negative; culture pending.  Patient had a PCR COVID test and the result is pending.  Discussed symptomatic treatment, including ibuprofen or Tylenol.  Instructed her to follow-up with her PCP if her symptoms are not improving.  She agrees to plan of care.   Final Clinical Impressions(s) / UC Diagnoses   Final diagnoses:  Sore throat  Viral URI     Discharge Instructions     Your rapid strep test is negative.  A throat culture is pending; we will call you if it is positive requiring treatment.    Take Tylenol or ibuprofen as needed for fever or discomfort.  Follow up with your primary care provider if your symptoms are not improving.        ED Prescriptions    None     PDMP not reviewed this encounter.   Mickie Bail, NP 03/30/21 1038

## 2021-03-30 NOTE — Discharge Instructions (Signed)
Your rapid strep test is negative.  A throat culture is pending; we will call you if it is positive requiring treatment.    Take Tylenol or ibuprofen as needed for fever or discomfort.     Follow-up with your primary care provider if your symptoms are not improving.      

## 2021-03-30 NOTE — ED Triage Notes (Signed)
Pt presents with complaints of low grade fever (100) and sore throat that started last night. Reports she has a covid test pending.

## 2021-03-31 ENCOUNTER — Ambulatory Visit: Payer: 59 | Admitting: Physical Therapy

## 2021-04-02 LAB — CULTURE, GROUP A STREP (THRC)

## 2021-04-05 ENCOUNTER — Other Ambulatory Visit: Payer: Self-pay

## 2021-04-05 ENCOUNTER — Encounter: Payer: Self-pay | Admitting: Physical Therapy

## 2021-04-05 ENCOUNTER — Ambulatory Visit: Payer: 59 | Attending: Sports Medicine | Admitting: Physical Therapy

## 2021-04-05 DIAGNOSIS — M25562 Pain in left knee: Secondary | ICD-10-CM | POA: Insufficient documentation

## 2021-04-05 DIAGNOSIS — R262 Difficulty in walking, not elsewhere classified: Secondary | ICD-10-CM | POA: Diagnosis not present

## 2021-04-05 DIAGNOSIS — M6281 Muscle weakness (generalized): Secondary | ICD-10-CM | POA: Insufficient documentation

## 2021-04-05 NOTE — Therapy (Signed)
[place] [facility] [address], [place], [address] Phone: [phone]   Fax:  [phone]  Physical Therapy Treatment  Patient Details  Name: Olivia Henderson MRN: [id number] Date of Birth: February 26, 1991 Referring Provider (PT): Dorthula Nettles, DO   Encounter Date: 04/05/2021   PT End of Session - 04/05/21 1729    Visit Number 2    Number of Visits 24    Date for PT Re-Evaluation 06/16/21    Authorization Type [place] [employer] reporting period from 03/24/2021    Progress Note Due on Visit 10    PT Start Time 1650    PT Stop Time 1729    PT Time Calculation (min) 39 min    Activity Tolerance Patient tolerated treatment well    Behavior During Therapy [facility] for tasks assessed/performed           Past Medical History:  Diagnosis Date  . Anemia    with first pregnancy  . Chicken pox   . Complication of anesthesia    nausea and vomiting  . COVID-19    11/15/20  . GERD (gastroesophageal reflux disease)   . Preeclampsia    with G1  . Unspecified hypothyroidism 07/10/2014    Past Surgical History:  Procedure Laterality Date  . CESAREAN SECTION  2014  . WISDOM TOOTH EXTRACTION      There were no vitals filed for this visit.   Subjective Assessment - 04/05/21 1652    Subjective Patient reports she missed her last PT appointment due to a sore throat and fever. Negative for strep, COVID19, and returned to work feeling better. Knee has been doing okay. Did not wear her brace at work today after getting sun burnt this weekend but arrives now with brace. No knee pain currently. HEP has been going well. Wall sits are still challenging.    Pertinent History Patient is a 30 y.o. female who presents to outpatient physical therapy with a referral for medical diagnosis chronic pain of left knee, ligamentous laxity of left knee, lateral dislocation of lfet patella, sequela, recurrent dislocation of left patella. 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  Relevant past medical history and comorbidities include Attention deficit disorder, GERD, hypothyroidism.  Patient denies hx of cancer, stroke, seizures, lung problem, major cardiac events, diabetes, unexplained weight loss, changes in bowel or bladder problems, new onset stumbling or dropping things, osteoporosis, spine surgeries.    Limitations Standing;Walking;House hold activities;Other (comment)   nervous moving really fast at work and takes more concentration when she is walking all around, walking for fitness/leisure, squatting down, running, jumping, increased caution with how she moves with anything involving left knee.   Diagnostic tests L knee radiograph report 02/24/2021: "negative"    Patient Stated Goals to get her knee better so she doesn't have to have surgery    Currently in Pain? No/denies    Pain Onset More than a month ago            TREATMENT:  Therapeutic exercise:to centralize symptoms and improve ROM, strength, muscular endurance, and activity tolerance required for successful completion of functional activities.  - L ASLR, 1x5, with 4 second hold  - wall squat, 5x11-14  seconds - total gym squat, B LE at level 24, 1x10.  - total gym squat, SLS at level 24,  2x10 each side focusing on LE alignment.  - standing hip hikes off edge of 2x4 board, 3x10 each side with U  UE support.  - seated long arc quad with red theraband loop around back leg of chair, 3x10 (moderate difficulty).  - standing single leg stance hand taps to cone on the floor, 3x10 each side.  - standing side stepping with red theraband wrapped around  B LE with emphasis on toes forwards and knees slightly out with hip ER engaged, 1x20 each direction.  - Education on HEP including handout   Pt required multimodal cuing for proper technique and to facilitate improved neuromuscular control, strength, range of motion, and functional ability resulting in improved performance and form.  HOME EXERCISE PROGRAM Access Code: [email or web address] URL: [email or web address] Date: 04/05/2021 Prepared by: Norton Blizzard  Exercises Sitting Knee Extension with Resistance - 1 x daily - 3 sets - 10 reps Wall Squat - 1 x daily - 1 sets - 5-10 reps - 20 second hold hold Hip Hikes off step - 1 x daily - 3 sets - 10 reps    PT Education - 04/05/21 1728    Education Details Exercise purpose/form. Self management techniques.    Person(s) Educated Patient    Methods Explanation;Demonstration;Tactile cues;Verbal cues;Handout    Comprehension Verbalized understanding;Returned demonstration;Verbal cues required;Tactile cues required;Need further instruction            PT Short Term Goals - 04/05/21 1728      PT SHORT TERM GOAL #1   Title Be independent with initial home exercise program for self-management of symptoms.    Baseline initial HEP provided at IE (03/24/2021);    Time 2    Period Weeks    Status Achieved    Target Date 04/07/21             PT Long Term Goals - 03/24/21 1751      PT LONG TERM GOAL #1   Title Be independent with a long-term home exercise program for self-management of symptoms.    Baseline Initial HEP provided at IE (03/24/2021);    Time 12    Period Weeks    Status New   TARGET DATE FOR ALL LONG TERM GOALS: 06/16/2021     PT LONG TERM GOAL #2   Title Demonstrate improved FOTO to equal or greater than 83 by visit #10 to demonstrate improvement in overall condition and self-reported functional ability.    Baseline 73 (03/24/2021);    Time 12    Period Weeks    Status New      PT LONG TERM GOAL #3   Title Be able  to squat to chair height with proper form without limitation due to current condition in order to improve ability to lift and complete transfers during usual activities.    Baseline patient allows knees to track well forward of toes, stance more narrow than ideal, mild increase in lateral patellar tracking as knee approaches extension on left compared to right (03/24/2021);    Time 12    Period Weeks    Status New      PT LONG TERM GOAL #4   Title Patient will score 1 or less on Lateral Step Down Test left LE to demonstrate improved strength and LE control to help reduce risk of patellar dislocation during functional activities.    Baseline not tested (03/24/2021);  Time 12    Period Weeks    Status New      PT LONG TERM GOAL #5   Title Complete community, work and/or recreational activities without limitation due to current condition.    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 Focused on hip and knee strengthening and control. Patient would benefit from continued management of limiting condition by skilled physical therapist to address remaining impairments and functional limitations to work towards stated goals and return to PLOF or maximal functional independence.    Personal Factors and Comorbidities Comorbidity 3+;Time since onset of injury/illness/exacerbation;Fitness;Past/Current Experience;Age    Comorbidities Relevant past medical history and comorbidities include Attention deficit disorder, GERD, hypothyroidism.    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   Stability/Clinical Decision Making Stable/Uncomplicated    Rehab Potential Good    PT Frequency 2x / week    PT Duration 12 weeks    PT Treatment/Interventions ADLs/Self Care Home Management;Cryotherapy;Moist Heat;Electrical Stimulation;Neuromuscular re-education;Balance training;Therapeutic exercise;Therapeutic activities;Patient/family education;Manual techniques;Dry needling;Joint Manipulations;Spinal Manipulations    PT Next Visit Plan hip and knee strength, proprioception exercises, update HEP as appropriate    PT Home Exercise Plan medbridge Access Code: [email or web address]    Consulted and Agree with Plan of Care Patient           Patient will benefit from skilled therapeutic intervention in order to improve the following deficits and impairments:  Improper body mechanics,Pain,Postural dysfunction,Hypermobility,Decreased mobility,Decreased activity tolerance,Decreased endurance,Decreased strength,Impaired perceived functional ability,Difficulty walking,Impaired flexibility  Visit Diagnosis: Left knee pain, unspecified chronicity  Muscle weakness (generalized)  Difficulty in walking, not elsewhere classified     Problem List Patient Active Problem List   Diagnosis Date Noted  . Acute non-recurrent maxillary sinusitis 09/03/2020  . Atypical toothache 09/03/2020  . Obesity (BMI 30-39.9) 08/13/2020  . Annual physical exam 08/12/2020  . S/P cesarean section 10/23/2019  . VBAC, delivered 09/12/2019  . ADD (attention deficit disorder) 06/03/2016  . Hypothyroidism 07/10/2014    Luretha Murphy. Ilsa Iha, PT,  DPT 04/05/21, 5:30 PM  [place] [facility] PHYSICAL AND SPORTS MEDICINE [address] [place], [address] Phone: [phone]   Fax:  [phone]  Name: Olivia Henderson MRN: [id number] Date of Birth: 06-Feb-1991

## 2021-04-07 ENCOUNTER — Ambulatory Visit: Payer: 59 | Admitting: Physical Therapy

## 2021-04-07 ENCOUNTER — Encounter: Payer: Self-pay | Admitting: Physical Therapy

## 2021-04-07 ENCOUNTER — Other Ambulatory Visit: Payer: Self-pay

## 2021-04-07 DIAGNOSIS — M25562 Pain in left knee: Secondary | ICD-10-CM | POA: Diagnosis not present

## 2021-04-07 DIAGNOSIS — M6281 Muscle weakness (generalized): Secondary | ICD-10-CM

## 2021-04-07 DIAGNOSIS — R262 Difficulty in walking, not elsewhere classified: Secondary | ICD-10-CM | POA: Diagnosis not present

## 2021-04-07 NOTE — Therapy (Addendum)
[place] [facility]ORTS MEDICINE [address] [place], [address] Phone: [phone]   Fax:  [phone]  Physical Therapy Treatment  Patient Details  Name: Olivia Henderson MRN: [id number] Date of Birth: 04/20/1991 Referring Provider (PT): Dorthula Nettles, DO   Encounter Date: 04/07/2021   PT End of Session - 04/07/21 1518    Visit Number 3    Number of Visits 24    Date for PT Re-Evaluation 06/16/21    Authorization Type [place] [employer] reporting period from 03/24/2021    Progress Note Due on Visit 10    PT Start Time 1515    PT Stop Time 1555    PT Time Calculation (min) 40 min    Activity Tolerance Patient tolerated treatment well    Behavior During Therapy [facility] for tasks assessed/performed           Past Medical History:  Diagnosis Date  . Anemia    with first pregnancy  . Chicken pox   . Complication of anesthesia    nausea and vomiting  . COVID-19    11/15/20  . GERD (gastroesophageal reflux disease)   . Preeclampsia    with G1  . Unspecified hypothyroidism 07/10/2014    Past Surgical History:  Procedure Laterality Date  . CESAREAN SECTION  2014  . WISDOM TOOTH EXTRACTION      There were no vitals filed for this visit.   Subjective Assessment - 04/07/21 1516    Subjective Patient reports she was very sore in her left hamstrings and hip after last treatment session but that is getting better and is consistent with DOMS. States she has no pain upon arrival, just some soreness.    Pertinent History Patient is a 30 y.o. female who presents to outpatient physical therapy with a referral for medical diagnosis chronic pain of left knee, ligamentous laxity of left knee, lateral dislocation of lfet patella, sequela, recurrent dislocation of left patella. 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  Relevant past medical history and comorbidities include Attention deficit disorder, GERD, hypothyroidism.  Patient denies hx of cancer, stroke, seizures, lung problem, major cardiac events, diabetes, unexplained weight loss, changes in bowel or bladder problems, new onset stumbling or dropping things, osteoporosis, spine surgeries.    Limitations Standing;Walking;House hold activities;Other (comment)   nervous moving really fast at work and takes more concentration when she is walking all around, walking for fitness/leisure, squatting down, running, jumping, increased caution with how she moves with anything involving left knee.   Diagnostic tests L knee radiograph report 02/24/2021: "negative"    Patient Stated Goals to get her knee better so she doesn't have to have surgery    Currently in Pain? No/denies    Pain Onset More than a month ago          OBJECTIVE FOTO = 82 (04/07/2021)  TREATMENT: Therapeutic exercise:to centralize symptoms and improve ROM, strength, muscular endurance, and activity tolerance required for successful completion of functional activities. - standing hip hikes off edge of 2x4 board, 3x10 each side with U  UE support.  - standing side stepping with green theraband wrapped around B LE with emphasis on toes forwards and knees slightly out  with hip ER engaged, 1x20 each direction.  - total gym squat, BLE at level 24, 1x20 - total gym squat, SLS at level 24,  3x10 each side focusing on LE alignment.  - standing ankle dorsiflexion with buttocks leaning on wall, 2x10 both sides.  - standing running man, 3x10 each side, firm surface - omega knee extension machine, 3x10 each side single leg knee extension @ 15# stack - standing single leg stance hand taps to cone  on the floor, 3x10 each side.  - hooklying double leg bridge 1x7 - hooklying single leg bridge, 2x10 each side.   -Education on HEP including handout  Pt required multimodal cuing for proper technique and to facilitate improved neuromuscular control, strength, range of motion, and functional ability resulting in improved performance and form.  HOME EXERCISE PROGRAM Access Code: [email or web address] URL: [email or web address] Date: 04/07/2021 Prepared by: Norton Blizzard  Exercises Wall Squat - 1 x daily - 1 sets - 5-10 reps - 20 second hold hold Hip Hikes off step - 1 x daily - 3 sets - 10 reps Single Leg Running Balance - 1 x daily - 3 sets - 10 reps    PT Education - 04/07/21 1518    Education Details Exercise purpose/form. Self management techniques.    Person(s) Educated Patient    Methods Explanation;Demonstration;Tactile cues;Verbal cues    Comprehension Verbalized understanding;Returned demonstration;Verbal cues required;Tactile cues required;Need further instruction            PT Short Term Goals - 04/05/21 1728      PT SHORT TERM GOAL #1   Title Be independent with initial home exercise program for self-management of symptoms.    Baseline initial HEP provided at IE (03/24/2021);    Time 2    Period Weeks    Status Achieved    Target Date 04/07/21             PT Long Term Goals - 03/24/21 1751      PT LONG TERM GOAL #1   Title Be independent with a long-term home exercise program for self-management of symptoms.    Baseline Initial HEP provided at IE (03/24/2021);    Time 12    Period Weeks    Status New   TARGET DATE FOR ALL LONG TERM GOALS: 06/16/2021     PT LONG TERM GOAL #2   Title Demonstrate improved FOTO to equal or greater than 83 by visit #10 to demonstrate improvement in overall condition and self-reported functional ability.    Baseline 73 (03/24/2021);    Time 12    Period Weeks    Status New      PT LONG TERM GOAL #3   Title Be able to  squat to chair height with proper form without limitation due to current condition in order to improve ability to lift and complete transfers during usual activities.    Baseline patient allows knees to track well forward of toes, stance more narrow than ideal, mild increase in lateral patellar tracking as knee approaches extension on left compared to right (03/24/2021);    Time 12    Period Weeks    Status New      PT LONG TERM GOAL #4   Title Patient will score 1 or less on Lateral Step Down Test left LE to demonstrate improved strength and LE control to help reduce risk of patellar dislocation during functional activities.    Baseline not tested (03/24/2021);    Time 12    Period  Weeks    Status New      PT LONG TERM GOAL #5   Title Complete community, work and/or recreational activities without limitation due to current condition.    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 Session focused on improved LE strength and control. Patient would benefit from continued management of limiting condition by skilled physical therapist to address remaining impairments and functional limitations to work towards stated goals and return to PLOF or maximal functional independence.    Personal Factors and Comorbidities Comorbidity 3+;Time since onset of injury/illness/exacerbation;Fitness;Past/Current Experience;Age    Comorbidities Relevant past medical history and comorbidities include Attention deficit disorder, GERD, hypothyroidism.    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   Stability/Clinical Decision Making Stable/Uncomplicated    Rehab Potential Good    PT Frequency 2x / week    PT Duration 12 weeks    PT Treatment/Interventions ADLs/Self Care Home Management;Cryotherapy;Moist Heat;Electrical Stimulation;Neuromuscular re-education;Balance training;Therapeutic exercise;Therapeutic activities;Patient/family education;Manual techniques;Dry needling;Joint Manipulations;Spinal Manipulations    PT Next Visit Plan hip and knee strength, proprioception exercises, update HEP as appropriate    PT Home Exercise Plan medbridge Access Code: [email or web address]    Consulted and Agree with Plan of Care Patient           Patient will benefit from skilled therapeutic intervention in order to improve the following deficits and impairments:  Improper body mechanics,Pain,Postural dysfunction,Hypermobility,Decreased mobility,Decreased activity tolerance,Decreased endurance,Decreased strength,Impaired perceived functional ability,Difficulty walking,Impaired flexibility  Visit Diagnosis: Left knee pain, unspecified chronicity  Muscle weakness (generalized)  Difficulty in walking, not elsewhere classified     Problem List Patient Active Problem List   Diagnosis Date Noted  . Acute non-recurrent maxillary sinusitis 09/03/2020  . Atypical toothache 09/03/2020  . Obesity (BMI 30-39.9) 08/13/2020  . Annual physical exam 08/12/2020  . S/P cesarean section 10/23/2019  . VBAC, delivered 09/12/2019  . ADD (attention deficit disorder) 06/03/2016  . Hypothyroidism 07/10/2014    Luretha Murphy.  Ilsa Iha, PT, DPT 04/07/21, 3:54 PM  [facility] PHYSICAL AND SPORTS MEDICINE [address], [place], [address] Phone: [phone]   Fax:  [phone]  Name: Olivia Henderson MRN: [id number] Date of Birth: Mar 09, 1991

## 2021-04-12 ENCOUNTER — Other Ambulatory Visit: Payer: Self-pay

## 2021-04-12 ENCOUNTER — Ambulatory Visit: Payer: 59 | Admitting: Physical Therapy

## 2021-04-12 ENCOUNTER — Encounter: Payer: Self-pay | Admitting: Physical Therapy

## 2021-04-12 DIAGNOSIS — R262 Difficulty in walking, not elsewhere classified: Secondary | ICD-10-CM

## 2021-04-12 DIAGNOSIS — M25562 Pain in left knee: Secondary | ICD-10-CM | POA: Diagnosis not present

## 2021-04-12 DIAGNOSIS — M6281 Muscle weakness (generalized): Secondary | ICD-10-CM | POA: Diagnosis not present

## 2021-04-12 NOTE — Therapy (Signed)
[place] [facility] PHYS[facility] AND [facility]OR[facility] MEDICINE [address], [place], [address] Phone: [phone]   Fax:  [phone]  Physical Therapy Treatment  Patient Details  Name: Olivia Henderson MRN: [id number] Date of Birth: 09-30-1991 Referring Provider (PT): Dorthula Nettles, DO   Encounter Date: 04/12/2021   PT End of Session - 04/12/21 1520     Visit Number 4    Number of Visits 24    Date for PT Re-Evaluation 06/16/21    Authorization Type [place] [employer] reporting period from 03/24/2021    Progress Note Due on Visit 10    PT Start Time 1515    PT Stop Time 1555    PT Time Calculation (min) 40 min    Activity Tolerance Patient tolerated treatment well    Behavior During Therapy [facility] for tasks assessed/performed             Past Medical History:  Diagnosis Date   Anemia    with first pregnancy   Chicken pox    Complication of anesthesia    nausea and vomiting   COVID-19    11/15/20   GERD (gastroesophageal reflux disease)    Preeclampsia    with G1   Unspecified hypothyroidism 07/10/2014    Past Surgical History:  Procedure Laterality Date   CESAREAN SECTION  2014   WISDOM TOOTH EXTRACTION      There were no vitals filed for this visit.   Subjective Assessment - 04/12/21 1517     Subjective Patient reports she is feeling well today with no pain upon arrival. Soreness was minimal at the left lateral thigh after last treatment session. HEP is going well.    Pertinent History Patient is a 30 y.o. female who presents to outpatient physical therapy with a referral for medical diagnosis chronic pain of left knee, ligamentous laxity of left knee, lateral dislocation of lfet patella, sequela, recurrent dislocation of left patella. 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  Relevant past medical history and comorbidities include Attention deficit disorder, GERD, hypothyroidism.  Patient denies hx of cancer, stroke, seizures, lung problem, major cardiac events, diabetes, unexplained weight loss, changes in bowel or bladder problems, new onset stumbling or dropping things, osteoporosis, spine surgeries.    Limitations Standing;Walking;House hold activities;Other (comment)   nervous moving really fast at work and takes more concentration when she is walking all around, walking for fitness/leisure, squatting down, running, jumping, increased caution with how she moves with anything involving left knee.   Diagnostic tests L knee radiograph report 02/24/2021: "negative"    Patient Stated Goals to get her knee better so she doesn't have to have surgery    Currently in Pain? No/denies    Pain Onset More than a month ago             OBJECTIVE FOTO = 82 (04/07/2021)   TREATMENT:  Therapeutic exercise: to centralize symptoms and improve ROM, strength, muscular endurance, and activity tolerance required for successful completion of functional activities.  - standing hip hikes off edge of 2x4 board, 3x10 each side with U  UE support. - standing side stepping with green theraband wrapped around B LE with emphasis on toes forwards and knees  slightly out with hip ER engaged, 1x20 each direction.  - split squat, 2x10 each side with focus on LE alignment,  U UE support.  - standing ankle dorsiflexion with buttocks leaning on wall, 3x10 both sides. - standing running man, 3x10 each side, firm surface (improved form - try compliant surface next session) - omega knee extension machine, 3x10 each side single leg knee extension @ 15# stack (back adjusted as close as possible) - standing single leg RDL with 5# to cone  on the floor, 3x10 each side. (plus a few cone taps with no weight practicing improving flat back).  - hooklying single leg bridge, 3x10 each side.    Pt required multimodal cuing for proper technique and to facilitate improved neuromuscular control, strength, range of motion, and functional ability resulting in improved performance and form.   HOME EXERCISE PROGRAM Access Code: [email or web address] URL: [email or web address] Date: 04/07/2021 Prepared by: Norton Blizzard   Exercises Wall Squat - 1 x daily - 1 sets - 5-10 reps - 20 second hold hold Hip Hikes off step - 1 x daily - 3 sets - 10 reps Single Leg Running Balance - 1 x daily - 3 sets - 10 reps   PT Education - 04/12/21 1518     Education Details Exercise purpose/form. Self management techniques.    Person(s) Educated Patient    Methods Explanation;Demonstration;Tactile cues;Verbal cues    Comprehension Verbalized understanding;Returned demonstration;Verbal cues required;Tactile cues required;Need further instruction              PT Short Term Goals - 04/05/21 1728       PT SHORT TERM GOAL #1   Title Be independent with initial home exercise program for self-management of symptoms.    Baseline initial HEP provided at IE (03/24/2021);    Time 2    Period Weeks    Status Achieved    Target Date 04/07/21               PT Long Term Goals - 03/24/21 1751       PT LONG TERM GOAL #1   Title Be independent with a long-term home exercise program for self-management of symptoms.    Baseline Initial HEP provided at IE (03/24/2021);    Time 12    Period Weeks    Status New   TARGET DATE FOR ALL LONG TERM GOALS: 06/16/2021     PT LONG TERM GOAL #2   Title Demonstrate improved FOTO to equal or greater than 83 by visit #10 to demonstrate improvement in overall condition and self-reported functional ability.    Baseline 73 (03/24/2021);    Time 12    Period Weeks    Status New      PT LONG TERM GOAL #3   Title Be  able to squat to chair height with proper form without limitation due to current condition in order to improve ability to lift and complete transfers during usual activities.    Baseline patient allows knees to track well forward of toes, stance more narrow than ideal, mild increase in lateral patellar tracking as knee approaches extension on left compared to right (03/24/2021);    Time 12    Period Weeks    Status New      PT LONG TERM GOAL #4   Title Patient will score 1 or less on Lateral Step Down Test left LE to demonstrate improved strength and LE control to help reduce risk of patellar dislocation during functional activities.  Baseline not tested (03/24/2021);    Time 12    Period Weeks    Status New      PT LONG TERM GOAL #5   Title Complete community, work and/or recreational activities without limitation due to current condition.    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 Continues to benefit from cuing and LE strengthening/loading. Patient would benefit from continued management of limiting condition by skilled physical therapist to address remaining impairments and functional limitations to work towards stated goals and return to PLOF or maximal functional independence.    Personal Factors and Comorbidities Comorbidity 3+;Time since onset of injury/illness/exacerbation;Fitness;Past/Current Experience;Age    Comorbidities Relevant past medical history and comorbidities include Attention deficit disorder, GERD,  hypothyroidism.    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   Stability/Clinical Decision Making Stable/Uncomplicated    Rehab Potential Good    PT Frequency 2x / week    PT Duration 12 weeks    PT Treatment/Interventions ADLs/Self Care Home Management;Cryotherapy;Moist Heat;Electrical Stimulation;Neuromuscular re-education;Balance training;Therapeutic exercise;Therapeutic activities;Patient/family education;Manual techniques;Dry needling;Joint Manipulations;Spinal Manipulations    PT Next Visit Plan hip and knee strength, proprioception exercises, update HEP as appropriate    PT Home Exercise Plan medbridge Access Code: [email or web address]    Consulted and Agree with Plan of Care Patient             Patient will benefit from skilled therapeutic intervention in order to improve the following deficits and impairments:  Improper body mechanics, Pain, Postural dysfunction, Hypermobility, Decreased mobility, Decreased activity tolerance, Decreased endurance, Decreased strength, Impaired perceived functional ability, Difficulty walking, Impaired flexibility  Visit Diagnosis: Left knee pain, unspecified chronicity  Muscle weakness (generalized)  Difficulty in walking, not elsewhere classified     Problem List Patient Active Problem List   Diagnosis Date Noted   Acute non-recurrent maxillary sinusitis 09/03/2020   Atypical toothache 09/03/2020   Obesity (BMI 30-39.9) 08/13/2020   Annual physical exam 08/12/2020   S/P cesarean section 10/23/2019   VBAC, delivered 09/12/2019   ADD (attention deficit disorder) 06/03/2016    Hypothyroidism 07/10/2014    Luretha Murphy. Ilsa Iha, PT, DPT 04/12/21, 3:52 PM  [place] [facility] PHYSICAL AND SPORTS MEDICINE [address] [place], [address] Phone: [phone]   Fax:  [phone]  Name: Elisse Pennick MRN: [id number] Date of Birth: 06/27/1991

## 2021-04-15 ENCOUNTER — Ambulatory Visit: Payer: 59 | Admitting: Physical Therapy

## 2021-04-15 ENCOUNTER — Other Ambulatory Visit: Payer: Self-pay

## 2021-04-15 ENCOUNTER — Encounter: Payer: Self-pay | Admitting: Physical Therapy

## 2021-04-15 DIAGNOSIS — M25562 Pain in left knee: Secondary | ICD-10-CM | POA: Diagnosis not present

## 2021-04-15 DIAGNOSIS — R262 Difficulty in walking, not elsewhere classified: Secondary | ICD-10-CM

## 2021-04-15 DIAGNOSIS — M6281 Muscle weakness (generalized): Secondary | ICD-10-CM | POA: Diagnosis not present

## 2021-04-15 NOTE — Therapy (Signed)
[place] [facility] [address] [place], [address] Phone: [phone]   Fax:  [phone]  Physical Therapy Treatment  Patient Details  Name: Olivia Henderson MRN: [id number] Date of Birth: 03-05-1991 Referring Provider (PT): Dorthula Nettles, DO   Encounter Date: 04/15/2021   PT End of Session - 04/15/21 1526     Visit Number 5    Number of Visits 24    Date for PT Re-Evaluation 06/16/21    Authorization Type [place] [employer] reporting period from 03/24/2021    Progress Note Due on Visit 10    PT Start Time 1520    PT Stop Time 1600    PT Time Calculation (min) 40 min    Activity Tolerance Patient tolerated treatment well    Behavior During Therapy [facility] for tasks assessed/performed             Past Medical History:  Diagnosis Date   Anemia    with first pregnancy   Chicken pox    Complication of anesthesia    nausea and vomiting   COVID-19    11/15/20   GERD (gastroesophageal reflux disease)    Preeclampsia    with G1   Unspecified hypothyroidism 07/10/2014    Past Surgical History:  Procedure Laterality Date   CESAREAN SECTION  2014   WISDOM TOOTH EXTRACTION      There were no vitals filed for this visit.   Subjective Assessment - 04/15/21 1524     Subjective Patient reports she is feeling well today. Left quads were pretty sore after last PT session but are fine today. Was busy at work today.    Pertinent History Patient is a 30 y.o. female who presents to outpatient physical therapy with a referral for medical diagnosis chronic pain of left knee, ligamentous laxity of left knee, lateral dislocation of lfet patella, sequela, recurrent dislocation of left patella. 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  Relevant past medical history and comorbidities include Attention deficit disorder, GERD, hypothyroidism.  Patient denies hx of cancer, stroke, seizures, lung problem, major cardiac events, diabetes, unexplained weight loss, changes in bowel or bladder problems, new onset stumbling or dropping things, osteoporosis, spine surgeries.    Limitations Standing;Walking;House hold activities;Other (comment)   nervous moving really fast at work and takes more concentration when she is walking all around, walking for fitness/leisure, squatting down, running, jumping, increased caution with how she moves with anything involving left knee.   Diagnostic tests L knee radiograph report 02/24/2021: "negative"    Patient Stated Goals to get her knee better so she doesn't have to have surgery    Currently in Pain? No/denies    Pain Onset More than a month ago               OBJECTIVE FOTO = 82 (04/07/2021)   TREATMENT:  Therapeutic exercise: to centralize symptoms and improve ROM, strength, muscular endurance, and activity tolerance required for successful completion of functional activities.  - standing hip hikes off edge of 2x4 board, 3x10 each side with U  UE support. - standing side stepping with green theraband wrapped around B LE with emphasis on toes forwards and knees slightly  out with hip ER engaged, 1x20 each direction.  - split squat, 2x10 each side with focus on LE alignment,  U UE support. - standing ankle dorsiflexion with buttocks leaning on wall, 3x10 both sides. - standing wall clams with red theraband around knees, 2x10 each side.  - standing running man, 3x10 each side (compliant surface last two sets), touch down UE support as needed. - omega knee extension machine, 3x10 each side single leg knee extension @ 15# stack (back adjusted as close as  possible). - standing single leg RDL with 5# to cone on the floor, 3x10 each side. (plus a few cone taps with no weight practicing improving flat back).  - hooklying single leg bridge, 3x10 each side.    Pt required multimodal cuing for proper technique and to facilitate improved neuromuscular control, strength, range of motion, and functional ability resulting in improved performance and form.   HOME EXERCISE PROGRAM Access Code: [email or web address] URL: [email or web address] Date: 04/07/2021 Prepared by: Norton Blizzard   Exercises Wall Squat - 1 x daily - 1 sets - 5-10 reps - 20 second hold hold Hip Hikes off step - 1 x daily - 3 sets - 10 reps Single Leg Running Balance - 1 x daily - 3 sets - 10 reps    PT Education - 04/15/21 1526     Education Details Exercise purpose/form. Self management techniques    Person(s) Educated Patient    Methods Explanation;Demonstration;Tactile cues;Verbal cues    Comprehension Verbalized understanding;Returned demonstration;Verbal cues required;Tactile cues required;Need further instruction              PT Short Term Goals - 04/05/21 1728       PT SHORT TERM GOAL #1   Title Be independent with initial home exercise program for self-management of symptoms.    Baseline initial HEP provided at IE (03/24/2021);    Time 2    Period Weeks    Status Achieved    Target Date 04/07/21               PT Long Term Goals - 03/24/21 1751       PT LONG TERM GOAL #1   Title Be independent with a long-term home exercise program for self-management of symptoms.    Baseline Initial HEP provided at IE (03/24/2021);    Time 12    Period Weeks    Status New   TARGET DATE FOR ALL LONG TERM GOALS: 06/16/2021     PT LONG TERM GOAL #2   Title Demonstrate improved FOTO to equal or greater than 83 by visit #10 to demonstrate improvement in overall condition and self-reported functional ability.    Baseline 73 (03/24/2021);    Time 12    Period Weeks     Status New      PT LONG TERM GOAL #3   Title Be able to squat to chair height with proper form without limitation due to current condition in order to improve ability to lift and complete transfers during usual activities.    Baseline patient allows knees to track well forward of toes, stance more narrow than ideal, mild increase in lateral patellar tracking as knee approaches extension on left compared to right (03/24/2021);    Time 12    Period Weeks    Status New      PT LONG TERM GOAL #4   Title Patient will score 1 or less on Lateral Step Down Test left LE to demonstrate improved strength and LE  control to help reduce risk of patellar dislocation during functional activities.    Baseline not tested (03/24/2021);    Time 12    Period Weeks    Status New      PT LONG TERM GOAL #5   Title Complete community, work and/or recreational activities without limitation due to current condition.    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 Kept exercises similar due to soreness after last PT session. Patient would benefit from continued management of limiting condition by skilled physical therapist to address remaining impairments and functional limitations to work towards stated goals and return to PLOF or maximal functional independence.    Personal Factors and Comorbidities Comorbidity 3+;Time since onset of injury/illness/exacerbation;Fitness;Past/Current Experience;Age    Comorbidities Relevant past medical history and  comorbidities include Attention deficit disorder, GERD, hypothyroidism.    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   Stability/Clinical Decision Making Stable/Uncomplicated    Rehab Potential Good    PT Frequency 2x / week    PT Duration 12 weeks    PT Treatment/Interventions ADLs/Self Care Home Management;Cryotherapy;Moist Heat;Electrical Stimulation;Neuromuscular re-education;Balance training;Therapeutic exercise;Therapeutic activities;Patient/family education;Manual techniques;Dry needling;Joint Manipulations;Spinal Manipulations    PT Next Visit Plan hip and knee strength, proprioception exercises, update HEP as appropriate    PT Home Exercise Plan medbridge Access Code: [email or web address]    Consulted and Agree with Plan of Care Patient             Patient will benefit from skilled therapeutic intervention in order to improve the following deficits and impairments:  Improper body mechanics, Pain, Postural dysfunction, Hypermobility, Decreased mobility, Decreased activity tolerance, Decreased endurance, Decreased strength, Impaired perceived functional ability, Difficulty walking, Impaired flexibility  Visit Diagnosis: Left knee pain, unspecified chronicity  Muscle weakness (generalized)  Difficulty in walking, not elsewhere classified     Problem List Patient Active Problem List   Diagnosis Date Noted   Acute non-recurrent maxillary sinusitis 09/03/2020   Atypical toothache 09/03/2020   Obesity (BMI 30-39.9) 08/13/2020   Annual physical exam 08/12/2020   S/P cesarean section 10/23/2019   VBAC, delivered  09/12/2019   ADD (attention deficit disorder) 06/03/2016   Hypothyroidism 07/10/2014    Luretha Murphy. Ilsa Iha, PT, DPT 04/15/21, 3:36 PM   [place] [facility] AND SPORTS [facility]ICINE [address] [place], [address] Phone: [phone]   Fax:  [phone]  Name: Teshia Mahone MRN: [id number] Date of Birth: 1991/05/05

## 2021-04-19 ENCOUNTER — Encounter: Payer: Self-pay | Admitting: Physical Therapy

## 2021-04-19 ENCOUNTER — Ambulatory Visit: Payer: 59 | Admitting: Physical Therapy

## 2021-04-19 ENCOUNTER — Other Ambulatory Visit: Payer: Self-pay

## 2021-04-19 DIAGNOSIS — R262 Difficulty in walking, not elsewhere classified: Secondary | ICD-10-CM

## 2021-04-19 DIAGNOSIS — M6281 Muscle weakness (generalized): Secondary | ICD-10-CM | POA: Diagnosis not present

## 2021-04-19 DIAGNOSIS — M25562 Pain in left knee: Secondary | ICD-10-CM

## 2021-04-19 NOTE — Therapy (Signed)
[facility] PHYS[facility] AND SPORTS MEDICINE [address], [place], [address] Phone: [phone]   Fax:  [phone]  Physical Therapy Treatment  Patient Details  Name: Olivia Henderson MRN: [id number] Date of Birth: Mar 25, 1991 Referring Provider (PT): Dorthula Nettles, DO   Encounter Date: 04/19/2021   PT End of Session - 04/19/21 1530     Visit Number 6    Number of Visits 24    Date for PT Re-Evaluation 06/16/21    Authorization Type [place] [employer] reporting period from 03/24/2021    Progress Note Due on Visit 10    PT Start Time 1516    PT Stop Time 1556    PT Time Calculation (min) 40 min    Activity Tolerance Patient tolerated treatment well    Behavior During Therapy [facility] for tasks assessed/performed             Past Medical History:  Diagnosis Date   Anemia    with first pregnancy   Chicken pox    Complication of anesthesia    nausea and vomiting   COVID-19    11/15/20   GERD (gastroesophageal reflux disease)    Preeclampsia    with G1   Unspecified hypothyroidism 07/10/2014    Past Surgical History:  Procedure Laterality Date   CESAREAN SECTION  2014   WISDOM TOOTH EXTRACTION      There were no vitals filed for this visit.   Subjective Assessment - 04/19/21 1518     Subjective Patinet reports she is feeling well today. Was a bit sore in bilateral quads after last sesion. No pain today.    Pertinent History Patient is a 30 y.o. female who presents to outpatient physical therapy with a referral for medical diagnosis chronic pain of left knee, ligamentous laxity of left knee, lateral dislocation of lfet patella, sequela, recurrent dislocation of left patella. 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  Relevant past medical history and comorbidities include Attention deficit disorder, GERD, hypothyroidism.  Patient denies hx of cancer, stroke, seizures, lung problem, major cardiac events, diabetes, unexplained weight loss, changes in bowel or bladder problems, new onset stumbling or dropping things, osteoporosis, spine surgeries.    Limitations Standing;Walking;House hold activities;Other (comment)   nervous moving really fast at work and takes more concentration when she is walking all around, walking for fitness/leisure, squatting down, running, jumping, increased caution with how she moves with anything involving left knee.   Diagnostic tests L knee radiograph report 02/24/2021: "negative"    Patient Stated Goals to get her knee better so she doesn't have to have surgery    Currently in Pain? No/denies    Pain Onset More than a month ago            OBJECTIVE FOTO = 82 (04/07/2021)   TREATMENT:  Therapeutic exercise: to centralize symptoms and improve ROM, strength, muscular endurance, and activity tolerance required for successful completion of functional activities.  - standing hip hikes off edge of 2x4 board, 3x10 each side with U  UE support. (Easy) - standing side stepping with green theraband wrapped around B LE with emphasis on toes forwards and knees slightly out with hip ER engaged, 1x20 feet  each direction.  - split squat, 3x10 each side with focus on LE alignment,  U UE support. (add band pulling medially next session).  - standing ankle dorsiflexion with buttocks leaning on wall, 2x15 both sides. - standing wall clams with red theraband around knees, 3x10 each side.  - standing running man, 3x10 each side, compliant surface (airex), touch down UE support as needed. - omega knee extension machine, 3x10 each side single leg knee extension @ 20# stack (back adjusted  as close as possible, rated hard but not painful). - standing single leg RDL with 10#KB, 3x10 each side. U UE support as needed.  - lateral heel tap off edge of 8.5 inch step L LE, 4 inc step, 3x10 each side.  - forward step up to 12 inch step, with U UE support on contralateral side, 1x10 each side.  - Education on HEP including handout    Pt required multimodal cuing for proper technique and to facilitate improved neuromuscular control, strength, range of motion, and functional ability resulting in improved performance and form.   HOME EXERCISE PROGRAM Access Code: [email or web address] URL: [email or web address] Date: 04/19/2021 Prepared by: Norton Blizzard  Exercises Standing Clam with Resistance Loop - 1 x daily - 3 sets - 10 reps Split Squats Upright Trunk with Dumbbells (Quad Bias) - 1 x daily - 3 sets - 10 reps Single Leg Running Balance - 1 x daily - 3 sets - 10 reps   PT Education - 04/19/21 1527     Education Details Exercise purpose/form. Self management techniques    Person(s) Educated Patient    Methods Explanation;Demonstration;Tactile cues;Verbal cues    Comprehension Verbalized understanding;Returned demonstration;Verbal cues required;Tactile cues required;Need further instruction              PT Short Term Goals - 04/05/21 1728       PT SHORT TERM GOAL #1   Title Be independent with initial home exercise program for self-management of symptoms.    Baseline initial HEP provided at IE (03/24/2021);    Time 2    Period Weeks    Status Achieved    Target Date 04/07/21               PT Long Term Goals - 03/24/21 1751       PT LONG TERM GOAL #1   Title Be independent with a long-term home exercise program for self-management of symptoms.    Baseline Initial HEP provided at IE (03/24/2021);    Time 12    Period Weeks    Status New   TARGET DATE FOR ALL LONG TERM GOALS: 06/16/2021     PT LONG TERM GOAL #2   Title Demonstrate improved FOTO to equal or  greater than 83 by visit #10 to demonstrate improvement in overall condition and self-reported functional ability.    Baseline 73 (03/24/2021);    Time 12    Period Weeks    Status New      PT LONG TERM GOAL #3   Title Be able to squat to chair height with proper form without limitation due to current condition in order to improve ability to lift and complete transfers during usual activities.    Baseline patient allows knees to track well forward of toes, stance more narrow than ideal, mild increase in lateral patellar tracking as knee approaches extension on left compared to right (03/24/2021);    Time 12    Period Weeks    Status New  PT LONG TERM GOAL #4   Title Patient will score 1 or less on Lateral Step Down Test left LE to demonstrate improved strength and LE control to help reduce risk of patellar dislocation during functional activities.    Baseline not tested (03/24/2021);    Time 12    Period Weeks    Status New      PT LONG TERM GOAL #5   Title Complete community, work and/or recreational activities without limitation due to current condition.    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 Continues to demonstrate deficit in L LE strength compared to R LE. Patient would benefit from continued management of limiting condition by skilled physical therapist to address remaining impairments and functional limitations to work towards stated goals and return to PLOF or maximal functional independence.    Personal Factors and  Comorbidities Comorbidity 3+;Time since onset of injury/illness/exacerbation;Fitness;Past/Current Experience;Age    Comorbidities Relevant past medical history and comorbidities include Attention deficit disorder, GERD, hypothyroidism.    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   Stability/Clinical Decision Making Stable/Uncomplicated    Rehab Potential Good    PT Frequency 2x / week    PT Duration 12 weeks    PT Treatment/Interventions ADLs/Self Care Home Management;Cryotherapy;Moist Heat;Electrical Stimulation;Neuromuscular re-education;Balance training;Therapeutic exercise;Therapeutic activities;Patient/family education;Manual techniques;Dry needling;Joint Manipulations;Spinal Manipulations    PT Next Visit Plan hip and knee strength, proprioception exercises, update HEP as appropriate    PT Home Exercise Plan medbridge Access Code: [email or web address]    Consulted and Agree with Plan of Care Patient             Patient will benefit from skilled therapeutic intervention in order to improve the following deficits and impairments:  Improper body mechanics, Pain, Postural dysfunction, Hypermobility, Decreased mobility, Decreased activity tolerance, Decreased endurance, Decreased strength, Impaired perceived functional ability, Difficulty walking, Impaired flexibility  Visit Diagnosis: Left knee pain, unspecified chronicity  Muscle weakness (generalized)  Difficulty in walking, not elsewhere classified     Problem List Patient Active Problem List   Diagnosis Date Noted   Acute non-recurrent maxillary sinusitis  09/03/2020   Atypical toothache 09/03/2020   Obesity (BMI 30-39.9) 08/13/2020   Annual physical exam 08/12/2020   S/P cesarean section 10/23/2019   VBAC, delivered 09/12/2019   ADD (attention deficit disorder) 06/03/2016   Hypothyroidism 07/10/2014    Luretha Murphy. Ilsa Iha, PT, DPT 04/19/21, 4:02 PM   [facility] PHYS[facility] AND SPORTS MEDICINE [address], [place], [address] Phone: [phone]   Fax:  [phone]  Name: Olivia Henderson MRN: [id number] Date of Birth: 02-22-1991

## 2021-04-21 ENCOUNTER — Ambulatory Visit: Payer: 59 | Admitting: Physical Therapy

## 2021-04-21 ENCOUNTER — Encounter: Payer: Self-pay | Admitting: Physical Therapy

## 2021-04-21 DIAGNOSIS — M25562 Pain in left knee: Secondary | ICD-10-CM

## 2021-04-21 DIAGNOSIS — R262 Difficulty in walking, not elsewhere classified: Secondary | ICD-10-CM

## 2021-04-21 DIAGNOSIS — M6281 Muscle weakness (generalized): Secondary | ICD-10-CM | POA: Diagnosis not present

## 2021-04-21 NOTE — Therapy (Signed)
[facility] PHYS[facility] SPORTS MEDICINE [address], [place], [address] Phone: [phone]   Fax:  [phone]  Physical Therapy Treatment  Patient Details  Name: Olivia Henderson MRN: [id number] Date of Birth: 30-Oct-1991 Referring Provider (PT): Dorthula Nettles, DO   Encounter Date: 04/21/2021   PT End of Session - 04/21/21 1521     Visit Number 6    Number of Visits 24    Date for PT Re-Evaluation 06/16/21    Authorization Type [place] [employer]MR reporting period from 03/24/2021    Progress Note Due on Visit 10    PT Start Time 1515    PT Stop Time 1555    PT Time Calculation (min) 40 min    Activity Tolerance Patient tolerated treatment well    Behavior During Therapy [facility] for tasks assessed/performed             Past Medical History:  Diagnosis Date   Anemia    with first pregnancy   Chicken pox    Complication of anesthesia    nausea and vomiting   COVID-19    11/15/20   GERD (gastroesophageal reflux disease)    Preeclampsia    with G1   Unspecified hypothyroidism 07/10/2014    Past Surgical History:  Procedure Laterality Date   CESAREAN SECTION  2014   WISDOM TOOTH EXTRACTION      There were no vitals filed for this visit.   Subjective Assessment - 04/21/21 1517     Subjective Patient reports she is feeling well upon arrival. No soreness after last session and no pain upon arrival.    Pertinent History Patient is a 30 y.o. female who presents to outpatient physical therapy with a referral for medical diagnosis chronic pain of left knee, ligamentous laxity of left knee, lateral dislocation of lfet patella, sequela, recurrent dislocation of left patella. 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  Relevant past medical history and comorbidities include Attention deficit disorder, GERD, hypothyroidism.  Patient denies hx of cancer, stroke, seizures, lung problem, major cardiac events, diabetes, unexplained weight loss, changes in bowel or bladder problems, new onset stumbling or dropping things, osteoporosis, spine surgeries.    Limitations Standing;Walking;House hold activities;Other (comment)   nervous moving really fast at work and takes more concentration when she is walking all around, walking for fitness/leisure, squatting down, running, jumping, increased caution with how she moves with anything involving left knee.   Diagnostic tests L knee radiograph report 02/24/2021: "negative"    Patient Stated Goals to get her knee better so she doesn't have to have surgery    Currently in Pain? No/denies    Pain Onset More than a month ago             OBJECTIVE FOTO = 82 (04/07/2021)   TREATMENT:  Therapeutic exercise: to centralize symptoms and improve ROM, strength, muscular endurance, and activity tolerance required for successful completion of functional activities.  - standing lateral heel tap on 4 inch step with hip abduction, 3x10 each side, no UE support.  - standing side stepping with green theraband wrapped around B LE with emphasis on toes forwards and knees slightly out with hip ER engaged, 1x20 feet  each direction.  - split squat with red theraband loop pulling medially (pillow case between band and skin for comfort).  3x10 each side with focus on LE alignment,  U UE support.   - standing ankle dorsiflexion with buttocks leaning on wall with heels on half foam roll, 2x15 both sides. - standing wall clams with green theraband around knees, 3x10 each side.  - omega knee extension machine, 3x10 each side single leg knee extension @ 25/20/20# stack (back adjusted as  close as possible, 25# too difficult to get knee fully extended). - standing running man, 3x10 each side, compliant surface (airex), no UE support.  - total gym squats full ROM (as deep as possible), 3x10 double leg stance, level 24 (no pain, consider progressing to single leg full range at tolerated level next session).  - standing single leg RDL with 10#KB, 3x10 each side. U UE support as needed.    Pt required multimodal cuing for proper technique and to facilitate improved neuromuscular control, strength, range of motion, and functional ability resulting in improved performance and form.   HOME EXERCISE PROGRAM Access Code: [email or web address] URL: [email or web address] Date: 04/19/2021 Prepared by: Norton Blizzard   Exercises Standing Clam with Resistance Loop - 1 x daily - 3 sets - 10 reps Split Squats Upright Trunk with Dumbbells (Quad Bias) - 1 x daily - 3 sets - 10 reps Single Leg Running Balance - 1 x daily - 3 sets - 10 reps     PT Education - 04/21/21 1520     Education Details Exercise purpose/form. Self management techniques    Person(s) Educated Patient    Methods Explanation;Demonstration;Tactile cues;Verbal cues    Comprehension Verbalized understanding;Returned demonstration;Verbal cues required;Tactile cues required;Need further instruction              PT Short Term Goals - 04/05/21 1728       PT SHORT TERM GOAL #1   Title Be independent with initial home exercise program for self-management of symptoms.    Baseline initial HEP provided at IE (03/24/2021);    Time 2    Period Weeks    Status Achieved    Target Date 04/07/21               PT Long Term Goals - 03/24/21 1751       PT LONG TERM GOAL #1   Title Be independent with a long-term home exercise program for self-management of symptoms.    Baseline Initial HEP provided at IE (03/24/2021);    Time 12    Period Weeks    Status New   TARGET DATE FOR ALL LONG TERM GOALS: 06/16/2021     PT  LONG TERM GOAL #2   Title Demonstrate improved FOTO to equal or greater than 83 by visit #10 to demonstrate improvement in overall condition and self-reported functional ability.    Baseline 73 (03/24/2021);    Time 12    Period Weeks    Status New      PT LONG TERM GOAL #3   Title Be able to squat to chair height with proper form without limitation due to current condition in order to improve ability to lift and complete transfers during usual activities.    Baseline patient allows knees to track well forward of toes, stance more narrow than ideal, mild increase in lateral patellar tracking as knee approaches extension on left compared to right (03/24/2021);    Time 12    Period Weeks  Status New      PT LONG TERM GOAL #4   Title Patient will score 1 or less on Lateral Step Down Test left LE to demonstrate improved strength and LE control to help reduce risk of patellar dislocation during functional activities.    Baseline not tested (03/24/2021);    Time 12    Period Weeks    Status New      PT LONG TERM GOAL #5   Title Complete community, work and/or recreational activities without limitation due to current condition.    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 Plan to consider progressing to single leg deep squat on total gym session. Patient would benefit from continued management of limiting condition by skilled physical therapist to address remaining impairments and functional limitations to work towards stated goals and  return to PLOF or maximal functional independence.    Personal Factors and Comorbidities Comorbidity 3+;Time since onset of injury/illness/exacerbation;Fitness;Past/Current Experience;Age    Comorbidities Relevant past medical history and comorbidities include Attention deficit disorder, GERD, hypothyroidism.    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   Stability/Clinical Decision Making Stable/Uncomplicated    Rehab Potential Good    PT Frequency 2x / week    PT Duration 12 weeks    PT Treatment/Interventions ADLs/Self Care Home Management;Cryotherapy;Moist Heat;Electrical Stimulation;Neuromuscular re-education;Balance training;Therapeutic exercise;Therapeutic activities;Patient/family education;Manual techniques;Dry needling;Joint Manipulations;Spinal Manipulations    PT Next Visit Plan hip and knee strength, proprioception exercises, update HEP as appropriate    PT Home Exercise Plan medbridge Access Code: [email or web address]    Consulted and Agree with Plan of Care Patient             Patient will benefit from skilled therapeutic intervention in order to improve the following deficits and impairments:  Improper body mechanics, Pain, Postural dysfunction, Hypermobility, Decreased mobility, Decreased activity tolerance, Decreased endurance, Decreased strength, Impaired perceived functional ability, Difficulty walking, Impaired flexibility  Visit Diagnosis: Left knee pain, unspecified chronicity  Muscle weakness (generalized)  Difficulty in walking, not elsewhere classified     Problem List Patient Active Problem  List   Diagnosis Date Noted   Acute non-recurrent maxillary sinusitis 09/03/2020   Atypical toothache 09/03/2020   Obesity (BMI 30-39.9) 08/13/2020   Annual physical exam 08/12/2020   S/P cesarean section 10/23/2019   VBAC, delivered 09/12/2019   ADD (attention deficit disorder) 06/03/2016   Hypothyroidism 07/10/2014    Luretha Murphy. Ilsa Iha, PT, DPT 04/21/21, 3:59 PM  [facility] PHYSICAL AND SPORTS MEDICINE [address] [place], [address] Phone: [phone]   Fax:  [phone]  Name: Damyra Luscher MRN: [id number] Date of Birth: [birth year]

## 2021-04-26 ENCOUNTER — Encounter: Payer: Self-pay | Admitting: Physical Therapy

## 2021-04-26 ENCOUNTER — Ambulatory Visit: Payer: 59 | Admitting: Physical Therapy

## 2021-04-26 DIAGNOSIS — M6281 Muscle weakness (generalized): Secondary | ICD-10-CM | POA: Diagnosis not present

## 2021-04-26 DIAGNOSIS — M25562 Pain in left knee: Secondary | ICD-10-CM | POA: Diagnosis not present

## 2021-04-26 DIAGNOSIS — R262 Difficulty in walking, not elsewhere classified: Secondary | ICD-10-CM

## 2021-04-26 NOTE — Therapy (Signed)
[facility] [address], [place], [address] Phone: [phone]   Fax:  [phone]  Physical Therapy Treatment / Progress Note Dates of reporting: 03/24/2021 - 04/26/2021  Patient Details  Name: Olivia Henderson MRN: [id number] Date of Birth: [birth year] Referring Provider (PT): Rosalia Hammers, DO   Encounter Date: 04/26/2021   PT End of Session - 04/26/21 2111     Visit Number 8    Number of Visits 24    Date for PT Re-Evaluation 06/16/21    Authorization Type [place] [employer] reporting period from 03/24/2021    Progress Note Due on Visit 10    PT Start Time 1516    PT Stop Time 1556    PT Time Calculation (min) 40 min    Activity Tolerance Patient tolerated treatment well    Behavior During Therapy [facility] for tasks assessed/performed             Past Medical History:  Diagnosis Date   Anemia    with first pregnancy   Chicken pox    Complication of anesthesia    nausea and vomiting   COVID-19    11/15/20   GERD (gastroesophageal reflux disease)    Preeclampsia    with G1   Unspecified hypothyroidism 07/10/2014    Past Surgical History:  Procedure Laterality Date   CESAREAN SECTION  2014   WISDOM TOOTH EXTRACTION      There were no vitals filed for this visit.   Subjective Assessment - 04/26/21 1522     Subjective Patinet reports she is feeling well today and has no pain upon arrival. Was not painful after last session. Feels like things are a lot more stable now and doesn't get the feeling her knee is going ot pop out of place. States she feels she is improving with PT. Would like to be able to run for fitnes, be more agile.    Pertinent History Patient is a 30 y.o. female who presents to outpatient physical therapy with a referral for medical diagnosis chronic pain of left knee, ligamentous laxity of left knee, lateral dislocation of lfet patella, sequela, recurrent dislocation of left  patella. 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  Relevant past medical history and comorbidities include Attention deficit disorder, GERD, hypothyroidism.  Patient denies hx of cancer, stroke, seizures, lung problem, major cardiac events, diabetes, unexplained weight loss, changes in bowel or bladder problems, new onset stumbling or dropping things, osteoporosis, spine surgeries.    Limitations Standing;Walking;House hold activities;Other (comment)   nervous moving really fast at work and takes more concentration when she is walking all around, walking for fitness/leisure, squatting down, running, jumping, increased caution with how she moves with anything involving left knee.   Diagnostic tests L knee radiograph report 02/24/2021: "negative"    Patient Stated Goals to get her knee better so she doesn't have to have surgery    Currently in Pain? No/denies    Pain Onset More than a month ago             OBJECTIVE FOTO = 84 (04/26/2021)  FUNCTIONAL/BALANCE TESTS: Lateral Step Down Test:  - R: +2 (tib tuberosity medial to 2nd toe, and  pelvi tips laterally towards non-support side).  - L: +2 (tib tuberosity medial to 2nd toe, and pelvi tips laterally towards non-support side).  Squat:  - knees track toward feet, wide stance, shift away from L LE.   TREATMENT:  Therapeutic exercise: to centralize symptoms and improve ROM, strength, muscular endurance, and activity tolerance required for successful completion of functional activities.  - standing wall clams with green theraband around knees, 3x10 each side.  - standing side stepping with green theraband wrapped around B  LE with emphasis on toes forwards and knees slightly out with hip ER engaged, 1x20 feet each direction (medium difficulty).  - lateral heel tap off 6 inch step with U UE support progressing to no UE support with mirror feedback with concentration on level pelvis, knee alignment, etc. 3x10 each side.  - standing ankle dorsiflexion with buttocks leaning on wall with heels on half foam roll, 2x15 both sides. - split squat with red theraband loop pulling medially (pillow case between band and skin for comfort).  3x10 each side with focus on LE alignment,  U UE support.   - total gym squats full ROM (as deep as possible), 1x10 double leg stance, level 24 - total gym quat full ROM, 2x10 each side, SLS, level 18 (tired) - recommended doing running man at home on pillow.    Pt required multimodal cuing for proper technique and to facilitate improved neuromuscular control, strength, range of motion, and functional ability resulting in improved performance and form.   HOME EXERCISE PROGRAM Access Code: [email or web address] URL: [email or web address] Date: 04/19/2021 Prepared by: Rosita Kea   Exercises Standing Clam with Resistance Loop - 1 x daily - 3 sets - 10 reps Split Squats Upright Trunk with Dumbbells (Quad [place]ias) - 1 x daily - 3 sets - 10 reps Single Leg Running Balance - 1 x daily - 3 sets - 10 reps    PT Education - 04/26/21 2111     Education Details Exercise purpose/form. Self management techniques. progress. POC    Methods Explanation;Demonstration;Tactile cues;Verbal cues    Comprehension Verbalized understanding;Returned demonstration;Verbal cues required;Tactile cues required;Need further instruction              PT Short Term Goals - 04/05/21 1728       PT SHORT TERM GOAL #1   Title Be independent with initial home exercise program for self-management of symptoms.    Baseline initial HEP provided at IE (03/24/2021);    Time 2    Period Weeks    Status Achieved     Target Date 04/07/21               PT Long Term Goals - 04/26/21 2112       PT LONG TERM GOAL #1   Title Be independent with a long-term home exercise program for self-management of symptoms.    Baseline Initial HEP provided at IE (03/24/2021); currently participating in appropriate HEP (6/27/202);    Time 12    Period Weeks    Status Partially Met   TARGET DATE FOR ALL LONG TERM GOALS: 06/16/2021     PT LONG TERM GOAL #2   Title Demonstrate improved FOTO to equal or greater than 83 by visit #10 to demonstrate improvement in overall condition and self-reported functional ability.    Baseline 73 (03/24/2021); 84 at visit #8 (6/27/202);    Time 12    Period Weeks    Status Achieved      PT LONG TERM GOAL #  3   Title Be able to squat to chair height with proper form without limitation due to current condition in order to improve ability to lift and complete transfers during usual activities.    Baseline patient allows knees to track well forward of toes, stance more narrow than ideal, mild increase in lateral patellar tracking as knee approaches extension on left compared to right (03/24/2021); knees track toward feet, wide stance, shift away from L LE (04/26/2021);    Time 12    Period Weeks    Status Partially Met      PT LONG TERM GOAL #4   Title Patient will score 1 or less on Lateral Step Down Test left LE to demonstrate improved strength and LE control to help reduce risk of patellar dislocation during functional activities.    Baseline not tested (03/24/2021); B sides +2 (04/26/2021);    Time 12    Period Weeks    Status Partially Met      PT LONG TERM GOAL #5   Title Complete community, work and/or recreational activities without limitation due to current condition.    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 Reports improved sensation of stability during her activities of daily living with increased confidence. Demonstrates improvements in FOTO score (self-reported function), squat form, and lateral step down test. Does continue to have less functional strength, balance, and stability in L LE compared to R and shifts away from left side during squat with pain with very deep squat. Does demonstrate increased valgus and pelvic tilting during lateral step down test. Patient would benefit from continued management of limiting condition by skilled physical therapist to address remaining impairments and functional limitations to work towards stated goals and return to PLOF or maximal functional independence.    Personal Factors and Comorbidities Comorbidity 3+;Time since onset of injury/illness/exacerbation;Fitness;Past/Current Experience;Age    Comorbidities Relevant past medical history and comorbidities include Attention deficit disorder, GERD, hypothyroidism.    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   Stability/Clinical Decision Making Stable/Uncomplicated    Rehab Potential Good    PT Frequency 2x /  week    PT Duration 12 weeks    PT Treatment/Interventions ADLs/Self Care Home Management;Cryotherapy;Moist Heat;Electrical Stimulation;Neuromuscular re-education;Balance training;Therapeutic exercise;Therapeutic activities;Patient/family education;Manual techniques;Dry needling;Joint Manipulations;Spinal Manipulations    PT Next Visit Plan hip and knee strength, proprioception exercises, update HEP as appropriate    PT Home Exercise Plan medbridge Access Code: [email or web address]    Consulted and Agree with Plan of Care Patient             Patient will benefit  from skilled therapeutic intervention in order to improve the following deficits and impairments:  Improper body mechanics, Pain, Postural dysfunction, Hypermobility, Decreased mobility, Decreased activity tolerance, Decreased endurance, Decreased strength, Impaired perceived functional ability, Difficulty walking, Impaired flexibility  Visit Diagnosis: Left knee pain, unspecified chronicity  Muscle weakness (generalized)  Difficulty in walking, not elsewhere classified     Problem List Patient Active Problem List   Diagnosis Date Noted   Acute non-recurrent maxillary sinusitis 09/03/2020   Atypical toothache 09/03/2020   Obesity (BMI 30-39.9) 08/13/2020   Annual physical exam 08/12/2020   S/P cesarean section 10/23/2019   VBAC, delivered 09/12/2019   ADD (attention deficit disorder) 06/03/2016   Hypothyroidism 07/10/2014    Everlean Alstrom. Graylon Good, PT, DPT 04/26/21, 9:18 PM   [facility] [address] [place], [address] Phone: [phone]   Fax:  [phone]  Name: Adaleena Mooers MRN: [id number] Date of Birth: 1991/08/05

## 2021-04-28 ENCOUNTER — Ambulatory Visit: Payer: 59 | Admitting: Physical Therapy

## 2021-04-28 ENCOUNTER — Encounter: Payer: Self-pay | Admitting: Physical Therapy

## 2021-04-28 ENCOUNTER — Other Ambulatory Visit: Payer: Self-pay

## 2021-04-28 DIAGNOSIS — M6281 Muscle weakness (generalized): Secondary | ICD-10-CM

## 2021-04-28 DIAGNOSIS — R262 Difficulty in walking, not elsewhere classified: Secondary | ICD-10-CM

## 2021-04-28 DIAGNOSIS — M25562 Pain in left knee: Secondary | ICD-10-CM

## 2021-04-28 NOTE — Therapy (Signed)
[facility] [address] [place], [address] Phone: [phone]   Fax:  [phone]  Physical Therapy Treatment  Patient Details  Name: Olivia Henderson MRN: [id number] Date of Birth: 1991/06/03 Referring Provider (PT): Rosalia Hammers, DO   Encounter Date: 04/28/2021   PT End of Session - 04/28/21 1711     Visit Number 9    Number of Visits 24    Date for PT Re-Evaluation 06/16/21    Authorization Type [place] [employer] reporting period from 03/24/2021    Progress Note Due on Visit 10    PT Start Time 1646    PT Stop Time 1736    PT Time Calculation (min) 50 min    Activity Tolerance Patient tolerated treatment well    Behavior During Therapy [facility] for tasks assessed/performed             Past Medical History:  Diagnosis Date   Anemia    with first pregnancy   Chicken pox    Complication of anesthesia    nausea and vomiting   COVID-19    11/15/20   GERD (gastroesophageal reflux disease)    Preeclampsia    with G1   Unspecified hypothyroidism 07/10/2014    Past Surgical History:  Procedure Laterality Date   CESAREAN SECTION  2014   WISDOM TOOTH EXTRACTION      There were no vitals filed for this visit.   Subjective Assessment - 04/28/21 1655     Subjective Pt reports she is feeling well and notes that she is feeling stronger since coming to Pt.  No complaints at the the moment with work or home duties.    Pertinent History Patient is a 30 y.o. female who presents to outpatient physical therapy with a referral for medical diagnosis chronic pain of left knee, ligamentous laxity of left knee, lateral dislocation of lfet patella, sequela, recurrent dislocation of left patella. 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  Relevant past medical history and comorbidities include Attention deficit disorder, GERD, hypothyroidism.  Patient denies hx of cancer, stroke, seizures, lung problem, major cardiac events, diabetes, unexplained weight loss, changes in bowel or bladder problems, new onset stumbling or dropping things, osteoporosis, spine surgeries.    Limitations Standing;Walking;House hold activities;Other (comment)   nervous moving really fast at work and takes more concentration when she is walking all around, walking for fitness/leisure, squatting down, running, jumping, increased caution with how she moves with anything involving left knee.   Diagnostic tests L knee radiograph report 02/24/2021: "negative"    Patient Stated Goals to get her knee better so she doesn't have to have surgery    Currently in Pain? No/denies    Pain Onset More than a month ago                 Therapeutic exercise: to centralize symptoms and improve ROM, strength, muscular endurance, and activity tolerance required for successful completion of functional activities.  - standing lateral heel tap on 4 inch step with hip abduction, 3x10 each side, no UE support.  - standing side stepping with green theraband wrapped around B LE with emphasis on toes forwards and knees slightly out  with hip ER engaged, 1x20 feet each direction.  - [place] split squat at green chair 3x10 each side with focus on LE alignment,  U UE support.   - standing ankle dorsiflexion with buttocks leaning on wall with heels on half foam roll, 2x15 both sides. - standing wall clams with green theraband around knees, 3x10 each side.  - omega knee extension machine, 3x10 each side single leg knee extension @ 25# stack (back adjusted as close as possible). - standing running man, 3x10 each side, compliant  surface (airex), no UE support. - standing star excursion x4 each LE, no UE support - total gym squats full ROM (as deep as possible), 3x10 double leg stance, level 24 (no pain, consider progressing to single leg full range at tolerated level next session).  - standing single leg RDL with 10#KB, 3x10 each side. U UE support as needed.    Pt required multimodal cuing for proper technique and to facilitate improved neuromuscular control, strength, range of motion, and functional ability resulting in improved performance and form.          PT Education - 04/28/21 1711     Education Details Exercise purpose/form. Self management techniques. progress. POC    Person(s) Educated Patient    Methods Explanation;Demonstration;Tactile cues    Comprehension Verbalized understanding;Returned demonstration;Verbal cues required;Tactile cues required;Need further instruction              PT Short Term Goals - 04/05/21 1728       PT SHORT TERM GOAL #1   Title Be independent with initial home exercise program for self-management of symptoms.    Baseline initial HEP provided at IE (03/24/2021);    Time 2    Period Weeks    Status Achieved    Target Date 04/07/21               PT Long Term Goals - 04/26/21 2112       PT LONG TERM GOAL #1   Title Be independent with a long-term home exercise program for self-management of symptoms.    Baseline Initial HEP provided at IE (03/24/2021); currently participating in appropriate HEP (6/27/202);    Time 12    Period Weeks    Status Partially Met   TARGET DATE FOR ALL LONG TERM GOALS: 06/16/2021     PT LONG TERM GOAL #2   Title Demonstrate improved FOTO to equal or greater than 83 by visit #10 to demonstrate improvement in overall condition and self-reported functional ability.    Baseline 73 (03/24/2021); 84 at visit #8 (6/27/202);    Time 12    Period Weeks    Status Achieved      PT LONG TERM GOAL #3   Title Be able to squat to chair  height with proper form without limitation due to current condition in order to improve ability to lift and complete transfers during usual activities.    Baseline patient allows knees to track well forward of toes, stance more narrow than ideal, mild increase in lateral patellar tracking as knee approaches extension on left compared to right (03/24/2021); knees track toward feet, wide stance, shift away from L LE (04/26/2021);    Time 12    Period Weeks    Status Partially Met      PT LONG TERM GOAL #4   Title Patient will score 1 or less on Lateral Step Down Test left LE to demonstrate improved strength and LE control to help reduce risk  of patellar dislocation during functional activities.    Baseline not tested (03/24/2021); B sides +2 (04/26/2021);    Time 12    Period Weeks    Status Partially Met      PT LONG TERM GOAL #5   Title Complete community, work and/or recreational activities without limitation due to current condition.    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  Pt is progressing with exercises and increased resp without fatigue noted.  Pt does still have lateral glide of B patella's when performing any single leg activity and taping was mentioned as part of treatment possibility going forward with main therapist.  Pt will continue to progress with LE  strengthening exercises to progress with POC and increase and maintain current QoL.    Personal Factors and Comorbidities Comorbidity 3+;Time since onset of injury/illness/exacerbation;Fitness;Past/Current Experience;Age    Comorbidities Relevant past medical history and comorbidities include Attention deficit disorder, GERD, hypothyroidism.    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   Stability/Clinical Decision Making Stable/Uncomplicated    Rehab Potential Good    PT Frequency 2x / week    PT Duration 12 weeks    PT Treatment/Interventions ADLs/Self Care Home Management;Cryotherapy;Moist Heat;Electrical Stimulation;Neuromuscular re-education;Balance training;Therapeutic exercise;Therapeutic activities;Patient/family education;Manual techniques;Dry needling;Joint Manipulations;Spinal Manipulations    PT Next Visit Plan hip and knee strength, proprioception exercises, update HEP as appropriate    PT Home Exercise Plan medbridge Access Code: [email or web address]    Consulted and Agree with Plan of Care Patient             Patient will benefit from skilled therapeutic intervention in order to improve the following deficits and impairments:  Improper body mechanics, Pain, Postural dysfunction, Hypermobility, Decreased mobility, Decreased activity tolerance, Decreased endurance, Decreased strength, Impaired perceived functional ability, Difficulty walking, Impaired flexibility  Visit Diagnosis: Left knee pain, unspecified chronicity  Muscle weakness (generalized)  Difficulty in walking, not elsewhere classified     Problem  List Patient Active Problem List   Diagnosis Date Noted   Acute non-recurrent maxillary sinusitis 09/03/2020   Atypical toothache 09/03/2020   Obesity (BMI 30-39.9) 08/13/2020   Annual physical exam 08/12/2020   S/P cesarean section 10/23/2019   VBAC, delivered 09/12/2019   ADD (attention deficit disorder) 06/03/2016   Hypothyroidism 07/10/2014    Christie Nottingham 04/28/2021, 7:36 PM  [facility] [address] [place], [address] Phone: [phone]   Fax:  [phone]  Name: Jakelin Taussig MRN: [id number] Date of Birth: 1991-04-29

## 2021-05-04 ENCOUNTER — Other Ambulatory Visit: Payer: Self-pay

## 2021-05-04 ENCOUNTER — Ambulatory Visit: Payer: 59 | Attending: Sports Medicine

## 2021-05-04 DIAGNOSIS — R262 Difficulty in walking, not elsewhere classified: Secondary | ICD-10-CM | POA: Insufficient documentation

## 2021-05-04 DIAGNOSIS — M6281 Muscle weakness (generalized): Secondary | ICD-10-CM | POA: Diagnosis not present

## 2021-05-04 DIAGNOSIS — M25562 Pain in left knee: Secondary | ICD-10-CM | POA: Diagnosis not present

## 2021-05-04 NOTE — Therapy (Signed)
[facility] [address], [place], [address] Phone: [phone]   Fax:  [phone]  Physical Therapy Treatment  Patient Details  Name: Olivia Henderson MRN: [id number] Date of Birth: 1990/12/10 Referring Provider (PT): Rosalia Hammers, DO   Encounter Date: 05/04/2021   PT End of Session - 05/04/21 1732     Visit Number 10    Number of Visits 24    Date for PT Re-Evaluation 06/16/21    Authorization Type [place] [employer] reporting period from 03/24/2021    Progress Note Due on Visit 10    PT Start Time 1645    PT Stop Time 1731    PT Time Calculation (min) 46 min    Activity Tolerance Patient tolerated treatment well    Behavior During Therapy [facility] for tasks assessed/performed             Past Medical History:  Diagnosis Date   Anemia    with first pregnancy   Chicken pox    Complication of anesthesia    nausea and vomiting   COVID-19    11/15/20   GERD (gastroesophageal reflux disease)    Preeclampsia    with G1   Unspecified hypothyroidism 07/10/2014    Past Surgical History:  Procedure Laterality Date   CESAREAN SECTION  2014   [place] TOOTH EXTRACTION      There were no vitals filed for this visit.   Subjective Assessment - 05/04/21 1729     Subjective Pt reports no pain today and states that HEP is going well. Interventions continue to be challenging. No falls or changes to medication since last session.    Pertinent History Patient is a 30 y.o. female who presents to outpatient physical therapy with a referral for medical diagnosis chronic pain of left knee, ligamentous laxity of left knee, lateral dislocation of lfet patella, sequela, recurrent dislocation of left patella. 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  Relevant past medical history and comorbidities include Attention deficit disorder, GERD, hypothyroidism.  Patient denies hx of cancer, stroke, seizures, lung problem, major cardiac events, diabetes, unexplained weight loss, changes in bowel or bladder problems, new onset stumbling or dropping things, osteoporosis, spine surgeries.    Limitations Standing;Walking;House hold activities;Other (comment)   nervous moving really fast at work and takes more concentration when she is walking all around, walking for fitness/leisure, squatting down, running, jumping, increased caution with how she moves with anything involving left knee.   Diagnostic tests L knee radiograph report 02/24/2021: "negative"    Patient Stated Goals to get her knee better so she doesn't have to have surgery    Currently in Pain? No/denies    Pain Onset More than a month ago             Therapeutic exercise: to centralize symptoms and improve ROM, strength, muscular endurance, and activity tolerance required for successful completion of functional activities.  - standing lateral heel tap on 4 inch step with hip abduction, 2x10 each side, no UE support. Mirror in front for biofeedback, TC at hips for glute med activation and pelvic stability - standing side stepping with green theraband wrapped around B LE with emphasis  on toes forwards and knees slightly out with hip ER engaged, 1x20 feet each direction.  - [place] split squat at green chair 3x10 each side with focus on LE alignment,  U UE support.   - standing ankle dorsiflexion with buttocks leaning on wall with heels on half foam roll, 2x15 both sides. - standing wall clams with green theraband around knees, 3x10 each side.  - omega knee extension machine, 3x10 each side single leg knee extension @ 25# stack (back  adjusted as close as possible). - standing running man, 3x10 each side, compliant surface (airex), no UE support. - standing star excursion x4 each LE, no UE support - total gym squats full ROM (as deep as possible), 2x10ea single leg stance, level 24  - standing single leg RDL with 10#KB, 3x10 each side. U UE support as needed.         PT Education - 05/04/21 1730     Education Details Exercise purpose/form. Self management techniques. progress. POC    Person(s) Educated Patient    Methods Explanation;Demonstration;Tactile cues    Comprehension Verbalized understanding;Returned demonstration;Verbal cues required;Tactile cues required;Need further instruction              PT Short Term Goals - 04/05/21 1728       PT SHORT TERM GOAL #1   Title Be independent with initial home exercise program for self-management of symptoms.    Baseline initial HEP provided at IE (03/24/2021);    Time 2    Period Weeks    Status Achieved    Target Date 04/07/21               PT Long Term Goals - 04/26/21 2112       PT LONG TERM GOAL #1   Title Be independent with a long-term home exercise program for self-management of symptoms.    Baseline Initial HEP provided at IE (03/24/2021); currently participating in appropriate HEP (6/27/202);    Time 12    Period Weeks    Status Partially Met   TARGET DATE FOR ALL LONG TERM GOALS: 06/16/2021     PT LONG TERM GOAL #2   Title Demonstrate improved FOTO to equal or greater than 83 by visit #10 to demonstrate improvement in overall condition and self-reported functional ability.    Baseline 73 (03/24/2021); 84 at visit #8 (6/27/202);    Time 12    Period Weeks    Status Achieved      PT LONG TERM GOAL #3   Title Be able to squat to chair height with proper form without limitation due to current condition in order to improve ability to lift and complete transfers during usual activities.    Baseline patient allows knees to track well forward  of toes, stance more narrow than ideal, mild increase in lateral patellar tracking as knee approaches extension on left compared to right (03/24/2021); knees track toward feet, wide stance, shift away from L LE (04/26/2021);    Time 12    Period Weeks    Status Partially Met      PT LONG TERM GOAL #4   Title Patient will score 1 or less on Lateral Step Down Test left LE to demonstrate improved strength and LE control to help reduce risk of patellar dislocation during functional activities.    Baseline not tested (03/24/2021); B sides +2 (04/26/2021);    Time 12    Period Weeks    Status Partially Met  PT LONG TERM GOAL #5   Title Complete community, work and/or recreational activities without limitation due to current condition.    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 Interventions continue to focus on eccentric quad control and pelvic stability. Tactile cues required for appropriate hip positioning with RDL's, bulgarian split squats, and lateral step downs for focus on targeted glute activation. Verbal cues also required for quad motor control with single leg squats on total gym. Pt will continue to benefit from skilled OPPT services to address deficits and reduce risk of patellar dislocation. Pt may benefit from circuit training at next visit. Will continue per POC.    Personal Factors and Comorbidities Comorbidity 3+;Time since onset of  injury/illness/exacerbation;Fitness;Past/Current Experience;Age    Comorbidities Relevant past medical history and comorbidities include Attention deficit disorder, GERD, hypothyroidism.    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   Stability/Clinical Decision Making Stable/Uncomplicated    Rehab Potential Good    PT Frequency 2x / week    PT Duration 12 weeks    PT Treatment/Interventions ADLs/Self Care Home Management;Cryotherapy;Moist Heat;Electrical Stimulation;Neuromuscular re-education;Balance training;Therapeutic exercise;Therapeutic activities;Patient/family education;Manual techniques;Dry needling;Joint Manipulations;Spinal Manipulations    PT Next Visit Plan hip and knee strength, proprioception exercises, update HEP as appropriate    PT Home Exercise Plan medbridge Access Code: [email or web address]    Consulted and Agree with Plan of Care Patient             Patient will benefit from skilled therapeutic intervention in order to improve the following deficits and impairments:  Improper body mechanics, Pain, Postural dysfunction, Hypermobility, Decreased mobility, Decreased activity tolerance, Decreased endurance, Decreased strength, Impaired perceived functional ability, Difficulty walking, Impaired flexibility  Visit Diagnosis: Left knee pain, unspecified chronicity  Muscle weakness (generalized)  Difficulty in walking, not elsewhere classified     Problem List Patient Active Problem List   Diagnosis Date Noted   Acute non-recurrent maxillary sinusitis 09/03/2020   Atypical toothache 09/03/2020   Obesity  (BMI 30-39.9) 08/13/2020   Annual physical exam 08/12/2020   S/P cesarean section 10/23/2019   VBAC, delivered 09/12/2019   ADD (attention deficit disorder) 06/03/2016   Hypothyroidism 07/10/2014   Herminio Commons, PT, DPT 6:26 PM,05/04/21   [facility] [address], [place], [address] Phone: [phone]   Fax:  [phone]  Name: Olivia Henderson MRN: [id number] Date of Birth: [birth year]

## 2021-05-06 ENCOUNTER — Ambulatory Visit: Payer: 59 | Admitting: Physical Therapy

## 2021-05-06 ENCOUNTER — Encounter: Payer: Self-pay | Admitting: Physical Therapy

## 2021-05-06 DIAGNOSIS — M25562 Pain in left knee: Secondary | ICD-10-CM | POA: Diagnosis not present

## 2021-05-06 DIAGNOSIS — R262 Difficulty in walking, not elsewhere classified: Secondary | ICD-10-CM | POA: Diagnosis not present

## 2021-05-06 DIAGNOSIS — M6281 Muscle weakness (generalized): Secondary | ICD-10-CM

## 2021-05-06 NOTE — Therapy (Signed)
[facility] [address], [place], [address] Phone: [phone]   Fax:  [phone]  Physical Therapy Treatment  Patient Details  Name: Olivia Henderson MRN: [id number] Date of Birth: [birth year] Referring Provider (PT): Rosalia Hammers, DO   Encounter Date: 05/06/2021   PT End of Session - 05/06/21 1529     Visit Number 11    Number of Visits 24    Date for PT Re-Evaluation 06/16/21    Authorization Type [place] [employer] reporting period from 6/27//2022    Progress Note Due on Visit 20    PT Start Time 1520    PT Stop Time 1600    PT Time Calculation (min) 40 min    Activity Tolerance Patient tolerated treatment well    Behavior During Therapy [facility] for tasks assessed/performed             Past Medical History:  Diagnosis Date   Anemia    with first pregnancy   Chicken pox    Complication of anesthesia    nausea and vomiting   COVID-19    11/15/20   GERD (gastroesophageal reflux disease)    Preeclampsia    with G1   Unspecified hypothyroidism 07/10/2014    Past Surgical History:  Procedure Laterality Date   CESAREAN SECTION  2014   [place] TOOTH EXTRACTION      There were no vitals filed for this visit.   Subjective Assessment - 05/06/21 1524     Subjective Patient reports no pain today upon arrival. States HEP is going well. State she was sore in her hamstrings after last PT session but cannot think of what she did differently.    Pertinent History Patient is a 30 y.o. female who presents to outpatient physical therapy with a referral for medical diagnosis chronic pain of left knee, ligamentous laxity of left knee, lateral dislocation of lfet patella, sequela, recurrent dislocation of left patella. 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  Relevant past medical history and comorbidities include Attention deficit disorder, GERD, hypothyroidism.  Patient denies hx of cancer, stroke, seizures, lung problem, major cardiac events, diabetes, unexplained weight loss, changes in bowel or bladder problems, new onset stumbling or dropping things, osteoporosis, spine surgeries.    Limitations Standing;Walking;House hold activities;Other (comment)   nervous moving really fast at work and takes more concentration when she is walking all around, walking for fitness/leisure, squatting down, running, jumping, increased caution with how she moves with anything involving left knee.   Diagnostic tests L knee radiograph report 02/24/2021: "negative"    Patient Stated Goals to get her knee better so she doesn't have to have surgery    Currently in Pain? No/denies    Pain Onset More than a month ago            TREATMENT:   Therapeutic exercise: to centralize symptoms and improve ROM, strength, muscular endurance, and activity tolerance required for successful completion of functional activities.  - standing lateral heel tap on 4/6/6 inch step with hip abduction, 2x10 each side, no UE support.  - standing cossack squat, 2x10 each side.  - [place] split squat at green chair 3x10 each side with focus  on LE alignment,  U UE support.  (cuing for improved depth) - standing ankle dorsiflexion with mini squat leaning on wall with heels on half foam roll, 2x15 both sides. - jump prep/training (mirror feedback): double leg squat 1x10, drops from heels elevated to squat,  1x5, 1x10; double leg hops to 4 inch step 2x10. Cuing for LE alignment and equal weight bearing between LE. - total gym squats full ROM (as deep as possible), 2x10 ea single leg stance, level 18.  Pt required  multimodal cuing for proper technique and to facilitate improved neuromuscular control, strength, range of motion, and functional ability resulting in improved performance and form.   HOME EXERCISE PROGRAM Access Code: [email or web address] URL: [email or web address] Date: 04/19/2021 Prepared by: Rosita Kea   Exercises Standing Clam with Resistance Loop - 1 x daily - 3 sets - 10 reps Split Squats Upright Trunk with Dumbbells (Quad Bias) - 1 x daily - 3 sets - 10 reps Single Leg Running Balance - 1 x daily - 3 sets - 10 reps    PT Education - 05/06/21 1525     Education Details Exercise purpose/form. Self management techniques.    Person(s) Educated Patient    Methods Explanation;Demonstration;Tactile cues;Verbal cues    Comprehension Verbalized understanding;Returned demonstration;Verbal cues required;Tactile cues required;Need further instruction              PT Short Term Goals - 04/05/21 1728       PT SHORT TERM GOAL #1   Title Be independent with initial home exercise program for self-management of symptoms.    Baseline initial HEP provided at IE (03/24/2021);    Time 2    Period Weeks    Status Achieved    Target Date 04/07/21               PT Long Term Goals - 04/26/21 2112       PT LONG TERM GOAL #1   Title Be independent with a long-term home exercise program for self-management of symptoms.    Baseline Initial HEP provided at IE (03/24/2021); currently participating in appropriate HEP (6/27/202);    Time 12    Period Weeks    Status Partially Met   TARGET DATE FOR ALL LONG TERM GOALS: 06/16/2021     PT LONG TERM GOAL #2   Title Demonstrate improved FOTO to equal or greater than 83 by visit #10 to demonstrate improvement in overall condition and self-reported functional ability.    Baseline 73 (03/24/2021); 84 at visit #8 (6/27/202);    Time 12    Period Weeks    Status Achieved      PT LONG TERM GOAL #3   Title Be able to squat to chair height  with proper form without limitation due to current condition in order to improve ability to lift and complete transfers during usual activities.    Baseline patient allows knees to track well forward of toes, stance more narrow than ideal, mild increase in lateral patellar tracking as knee approaches extension on left compared to right (03/24/2021); knees track toward feet, wide stance, shift away from L LE (04/26/2021);    Time 12    Period Weeks    Status Partially Met      PT LONG TERM GOAL #4   Title Patient will score 1 or less on Lateral Step Down Test left LE to demonstrate improved strength and LE control to help reduce risk of patellar dislocation during functional activities.    Baseline  not tested (03/24/2021); B sides +2 (04/26/2021);    Time 12    Period Weeks    Status Partially Met      PT LONG TERM GOAL #5   Title Complete community, work and/or recreational activities without limitation due to current condition.    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 Was quite apprehensive at first with altered movement pattern but was able to demonstrate improved motor control and ease of movement after practicing several repetitions of pre jumping and jumping activities. Does continue to allow knees to track medial to midline leg, especially just before take off and landing, and avoids some  weight bearing on L LE with B squatting and jumping activities. Plan to continue working on alignment, knee, and hip strength. Patient would benefit from continued management of limiting condition by skilled physical therapist to address remaining impairments and functional limitations to work towards stated goals and return to PLOF or maximal functional independence.    Personal Factors and Comorbidities Comorbidity 3+;Time since onset of injury/illness/exacerbation;Fitness;Past/Current Experience;Age    Comorbidities Relevant past medical history and comorbidities include Attention deficit disorder, GERD, hypothyroidism.    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   Stability/Clinical Decision Making Stable/Uncomplicated    Rehab Potential Good    PT Frequency 2x / week    PT Duration 12 weeks    PT Treatment/Interventions ADLs/Self Care Home Management;Cryotherapy;Moist Heat;Electrical Stimulation;Neuromuscular re-education;Balance training;Therapeutic exercise;Therapeutic activities;Patient/family education;Manual techniques;Dry needling;Joint Manipulations;Spinal Manipulations    PT Next Visit Plan hip and knee strength, proprioception exercises, update HEP as appropriate    PT Home Exercise Plan medbridge Access Code: [email or web address]    Consulted and Agree with Plan of Care Patient             Patient will benefit from skilled therapeutic intervention in order to improve the following deficits and impairments:  Improper body mechanics, Pain, Postural dysfunction, Hypermobility, Decreased mobility, Decreased activity  tolerance, Decreased endurance, Decreased strength, Impaired perceived functional ability, Difficulty walking, Impaired flexibility  Visit Diagnosis: Left knee pain, unspecified chronicity  Muscle weakness (generalized)  Difficulty in walking, not elsewhere classified     Problem List Patient Active Problem List   Diagnosis Date Noted   Acute non-recurrent maxillary sinusitis 09/03/2020   Atypical toothache 09/03/2020   Obesity (BMI 30-39.9) 08/13/2020   Annual physical exam 08/12/2020   S/P cesarean section 10/23/2019   VBAC, delivered 09/12/2019   ADD (attention deficit disorder) 06/03/2016   Hypothyroidism 07/10/2014    Everlean Alstrom. Graylon Good, PT, DPT 05/06/21, 8:18 PM  [facility]  [address] [place], [address] Phone: [phone]   Fax:  [phone]  Name: Olivia Henderson MRN: [id number] Date of Birth: [birth year]

## 2021-05-17 ENCOUNTER — Ambulatory Visit: Payer: 59 | Admitting: Physical Therapy

## 2021-05-17 ENCOUNTER — Encounter: Payer: Self-pay | Admitting: Physical Therapy

## 2021-05-17 DIAGNOSIS — M25562 Pain in left knee: Secondary | ICD-10-CM | POA: Diagnosis not present

## 2021-05-17 DIAGNOSIS — R262 Difficulty in walking, not elsewhere classified: Secondary | ICD-10-CM | POA: Diagnosis not present

## 2021-05-17 DIAGNOSIS — M6281 Muscle weakness (generalized): Secondary | ICD-10-CM | POA: Diagnosis not present

## 2021-05-17 NOTE — Therapy (Signed)
[facility] [address], [place], [address] Phone: [phone]   Fax:  [phone]  Physical Therapy Treatment  Patient Details  Name: Olivia Henderson MRN: [id number] Date of Birth: Dec 25, 1990 Referring Provider (PT): Rosalia Hammers, DO   Encounter Date: 05/17/2021   PT End of Session - 05/17/21 1521     Visit Number 12    Number of Visits 24    Date for PT Re-Evaluation 06/16/21    Authorization Type [place] [employer] reporting period from 6/27//2022    Progress Note Due on Visit 20    PT Start Time 1516    PT Stop Time 1556    PT Time Calculation (min) 40 min    Activity Tolerance Patient tolerated treatment well    Behavior During Therapy [facility] for tasks assessed/performed             Past Medical History:  Diagnosis Date   Anemia    with first pregnancy   Chicken pox    Complication of anesthesia    nausea and vomiting   COVID-19    11/15/20   GERD (gastroesophageal reflux disease)    Preeclampsia    with G1   Unspecified hypothyroidism 07/10/2014    Past Surgical History:  Procedure Laterality Date   CESAREAN SECTION  2014   WISDOM TOOTH EXTRACTION      There were no vitals filed for this visit.   Subjective Assessment - 05/17/21 1518     Subjective Patient reports she is feeling well today with no pain upon arrival. States she had a good vacation and was not limited by her knee in what she could do. States she only got to her HEP one time during her vacation.    Pertinent History Patient is a 30 y.o. female who presents to outpatient physical therapy with a referral for medical diagnosis chronic pain of left knee, ligamentous laxity of left knee, lateral dislocation of lfet patella, sequela, recurrent dislocation of left patella. 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  Relevant past medical history and comorbidities include Attention deficit disorder, GERD, hypothyroidism.  Patient denies hx of cancer, stroke, seizures, lung problem, major cardiac events, diabetes, unexplained weight loss, changes in bowel or bladder problems, new onset stumbling or dropping things, osteoporosis, spine surgeries.    Limitations Standing;Walking;House hold activities;Other (comment)   nervous moving really fast at work and takes more concentration when she is walking all around, walking for fitness/leisure, squatting down, running, jumping, increased caution with how she moves with anything involving left knee.   Diagnostic tests L knee radiograph report 02/24/2021: "negative"    Patient Stated Goals to get her knee better so she doesn't have to have surgery    Currently in Pain? No/denies    Pain Onset More than a month ago               TREATMENT:    Therapeutic exercise: to centralize symptoms and improve ROM, strength, muscular endurance, and activity tolerance required for successful completion of functional activities.  - standing lateral heel tap on 6 inch step, 3x10 each side, no UE support.  - box squat to 12 inch step,1x7 with  no added weight, 3x10 with 30#KB held at goblet squat position, mirror feedback to improve equal weight bearing.  - standing star excursion to 8 points,  no UE support, toes facing forwards, 1x3 each side on firm surface, 1x4 each side on airex pad (compliment surface). Mirror feedback - standing cossack squat, 3x10 each side, mirror feedback, 20# KB held in front of body.  - jump training (mirror feedback): double leg hops to 4 inch step 2x10; double leg hops forward up to and off of 4 inch step 1x10, double leg hophs  forward up to and off 4 inch step followed by hop 1x5.. Cuing for LE alignment and equal weight bearing between LE.  Pt required multimodal cuing for proper technique and to facilitate improved neuromuscular control, strength, range of motion, and functional ability resulting in improved performance and form.    HOME EXERCISE PROGRAM Access Code: [email or web address] URL: [email or web address] Date: 04/19/2021 Prepared by: Norton Blizzard   Exercises Standing Clam with Resistance Loop - 1 x daily - 3 sets - 10 reps Split Squats Upright Trunk with Dumbbells (Quad Bias) - 1 x daily - 3 sets - 10 reps Single Leg Running Balance - 1 x daily - 3 sets - 10 reps     PT Education - 05/17/21 1521     Education Details Exercise purpose/form. Self management techniques.    Person(s) Educated Patient    Methods Explanation;Demonstration;Tactile cues;Verbal cues    Comprehension Verbalized understanding;Returned demonstration;Verbal cues required;Tactile cues required;Need further instruction              PT Short Term Goals - 04/05/21 1728       PT SHORT TERM GOAL #1   Title Be independent with initial home exercise program for self-management of symptoms.    Baseline initial HEP provided at IE (03/24/2021);    Time 2    Period Weeks    Status Achieved    Target Date 04/07/21               PT Long Term Goals - 04/26/21 2112       PT LONG TERM GOAL #1   Title Be independent with a long-term home exercise program for self-management of symptoms.    Baseline Initial HEP provided at IE (03/24/2021); currently participating in appropriate HEP (6/27/202);    Time 12    Period Weeks    Status Partially Met   TARGET DATE FOR ALL LONG TERM GOALS: 06/16/2021     PT LONG TERM GOAL #2   Title Demonstrate improved FOTO to equal or greater than 83 by visit #10 to demonstrate improvement in overall condition and self-reported functional ability.    Baseline 73 (03/24/2021); 84 at visit #8  (6/27/202);    Time 12    Period Weeks    Status Achieved      PT LONG TERM GOAL #3   Title Be able to squat to chair height with proper form without limitation due to current condition in order to improve ability to lift and complete transfers during usual activities.    Baseline patient allows knees to track well forward of toes, stance more narrow than ideal, mild increase in lateral patellar tracking as knee approaches extension on left compared to right (03/24/2021); knees track toward feet, wide stance, shift away from L LE (04/26/2021);    Time 12    Period Weeks    Status Partially Met      PT LONG TERM GOAL #4  Title Patient will score 1 or less on Lateral Step Down Test left LE to demonstrate improved strength and LE control to help reduce risk of patellar dislocation during functional activities.    Baseline not tested (03/24/2021); B sides +2 (04/26/2021);    Time 12    Period Weeks    Status Partially Met      PT LONG TERM GOAL #5   Title Complete community, work and/or recreational activities without limitation due to current condition.    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 Able to perform loaded deep squats this session. Plan to continue progressing strength and functional activities focusing on good motor control and improved power. Patient would  benefit from continued management of limiting condition by skilled physical therapist to address remaining impairments and functional limitations to work towards stated goals and return to PLOF or maximal functional independence.    Personal Factors and Comorbidities Comorbidity 3+;Time since onset of injury/illness/exacerbation;Fitness;Past/Current Experience;Age    Comorbidities Relevant past medical history and comorbidities include Attention deficit disorder, GERD, hypothyroidism.    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   Stability/Clinical Decision Making Stable/Uncomplicated    Rehab Potential Good    PT Frequency 2x / week    PT Duration 12 weeks    PT Treatment/Interventions ADLs/Self Care Home Management;Cryotherapy;Moist Heat;Electrical Stimulation;Neuromuscular re-education;Balance training;Therapeutic exercise;Therapeutic activities;Patient/family education;Manual techniques;Dry needling;Joint Manipulations;Spinal Manipulations    PT Next Visit Plan hip and knee strength, proprioception exercises, update HEP as appropriate    PT Home Exercise Plan medbridge Access Code: [email or web address]    Consulted and Agree with Plan of Care Patient             Patient will benefit from skilled therapeutic intervention in order to improve the following deficits and impairments:  Improper body mechanics, Pain, Postural dysfunction, Hypermobility, Decreased mobility, Decreased activity tolerance, Decreased endurance, Decreased strength, Impaired perceived functional ability, Difficulty walking, Impaired flexibility  Visit  Diagnosis: Left knee pain, unspecified chronicity  Muscle weakness (generalized)  Difficulty in walking, not elsewhere classified     Problem List Patient Active Problem List   Diagnosis Date Noted   Acute non-recurrent maxillary sinusitis 09/03/2020   Atypical toothache 09/03/2020   Obesity (BMI 30-39.9) 08/13/2020   Annual physical exam 08/12/2020   S/P cesarean section 10/23/2019   VBAC, delivered 09/12/2019   ADD (attention deficit disorder) 06/03/2016   Hypothyroidism 07/10/2014    Everlean Alstrom. Graylon Good, PT, DPT 05/17/21, 4:15 PM   [facility] [address] [place], [address] Phone: [phone]  Fax:  [phone]  Name: Olivia Henderson MRN: [id number] Date of Birth: December 08, 1990

## 2021-05-19 ENCOUNTER — Encounter: Payer: Self-pay | Admitting: Physical Therapy

## 2021-05-19 ENCOUNTER — Ambulatory Visit: Payer: 59 | Admitting: Physical Therapy

## 2021-05-19 DIAGNOSIS — M6281 Muscle weakness (generalized): Secondary | ICD-10-CM

## 2021-05-19 DIAGNOSIS — R262 Difficulty in walking, not elsewhere classified: Secondary | ICD-10-CM

## 2021-05-19 DIAGNOSIS — M25562 Pain in left knee: Secondary | ICD-10-CM | POA: Diagnosis not present

## 2021-05-19 NOTE — Therapy (Signed)
[facility] [address], [place], [address] Phone: [phone]   Fax:  [phone]  Physical Therapy Treatment  Patient Details  Name: Olivia Henderson MRN: [id number] Date of Birth: 25-Mar-1991 Referring Provider (PT): Rosalia Hammers, DO   Encounter Date: 05/19/2021   PT End of Session - 05/19/21 1801     Visit Number 13    Number of Visits 24    Date for PT Re-Evaluation 06/16/21    Authorization Type [place] [employer]MR reporting period from 6/27//2022    Progress Note Due on Visit 20    PT Start Time 1520    PT Stop Time 1600    PT Time Calculation (min) 40 min    Activity Tolerance Patient tolerated treatment well    Behavior During Therapy [facility] for tasks assessed/performed             Past Medical History:  Diagnosis Date   Anemia    with first pregnancy   Chicken pox    Complication of anesthesia    nausea and vomiting   COVID-19    11/15/20   GERD (gastroesophageal reflux disease)    Preeclampsia    with G1   Unspecified hypothyroidism 07/10/2014    Past Surgical History:  Procedure Laterality Date   CESAREAN SECTION  2014   WISDOM TOOTH EXTRACTION      There were no vitals filed for this visit.   Subjective Assessment - 05/19/21 1634     Subjective Patient states she is a bit sore from her PT session 2 days ago but otherwise feeling fine and with no pain currently or following last session.    Pertinent History Patient is a 30 y.o. female who presents to outpatient physical therapy with a referral for medical diagnosis chronic pain of left knee, ligamentous laxity of left knee, lateral dislocation of lfet patella, sequela, recurrent dislocation of left patella. 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  Relevant past medical history and comorbidities include Attention deficit disorder, GERD, hypothyroidism.  Patient denies hx of cancer, stroke, seizures, lung problem, major cardiac events, diabetes, unexplained weight loss, changes in bowel or bladder problems, new onset stumbling or dropping things, osteoporosis, spine surgeries.    Limitations Standing;Walking;House hold activities;Other (comment)   nervous moving really fast at work and takes more concentration when she is walking all around, walking for fitness/leisure, squatting down, running, jumping, increased caution with how she moves with anything involving left knee.   Diagnostic tests L knee radiograph report 02/24/2021: "negative"    Patient Stated Goals to get her knee better so she doesn't have to have surgery    Currently in Pain? No/denies    Pain Onset More than a month ago             TREATMENT:    Therapeutic exercise: to centralize symptoms and improve ROM, strength, muscular endurance, and activity tolerance required for successful completion of functional activities.  - standing lateral heel tap on 6/8/8 inch step, 3x10 each side, no UE support.   Circuit: - box squat to 12 inch step, 3x10 with 40#KB held at goblet squat position, mirror feedback to improve equal weight bearing.  -  standing star excursion to 8 points,  no UE support, toes facing forwards, 3x2 each side on airex pad (compliment surface). Mirror feedback  - alternating diagonal forward hops with small hold with knee slightly flexed, 10x7 (using lily pad domes to mark each foot placement).  - skipping progressing to as high as possible with reach overhead, 10x20 feet  Circuit:  - standing cossack squat, 3x10 each side, mirror feedback, 20# KB held in front of body.  - SLS knee lock/unlock on  dynadisc with UE support as needed. 3x10  - Jump squat with hands touching floor between legs, 3x5  - heels elevated deep squat, 1x10   Pt required multimodal cuing for proper technique and to facilitate improved neuromuscular control, strength, range of motion, and functional ability resulting in improved performance and form.    HOME EXERCISE PROGRAM Access Code: [email or web address] URL: [email or web address] Date: 04/19/2021 Prepared by: Rosita Kea   Exercises Standing Clam with Resistance Loop - 1 x daily - 3 sets - 10 reps Split Squats Upright Trunk with Dumbbells (Quad Bias) - 1 x daily - 3 sets - 10 reps Single Leg Running Balance - 1 x daily - 3 sets - 10 reps     PT Education - 05/19/21 1801     Education Details Exercise purpose/form.    Person(s) Educated Patient    Methods Explanation;Demonstration;Verbal cues    Comprehension Verbalized understanding;Returned demonstration;Verbal cues required;Need further instruction              PT Short Term Goals - 04/05/21 1728       PT SHORT TERM GOAL #1   Title Be independent with initial home exercise program for self-management of symptoms.    Baseline initial HEP provided at IE (03/24/2021);    Time 2    Period Weeks    Status Achieved    Target Date 04/07/21               PT Long Term Goals - 04/26/21 2112       PT LONG TERM GOAL #1   Title Be independent with a long-term home exercise program for self-management of symptoms.    Baseline Initial HEP provided at IE (03/24/2021); currently participating in appropriate HEP (6/27/202);    Time 12    Period Weeks    Status Partially Met   TARGET DATE FOR ALL LONG TERM GOALS: 06/16/2021     PT LONG TERM GOAL #2   Title Demonstrate improved FOTO to equal or greater than 83 by visit #10 to demonstrate improvement in overall condition and self-reported functional ability.    Baseline 73 (03/24/2021); 84 at visit #8 (6/27/202);    Time 12    Period Weeks     Status Achieved      PT LONG TERM GOAL #3   Title Be able to squat to chair height with proper form without limitation due to current condition in order to improve ability to lift and complete transfers during usual activities.    Baseline patient allows knees to track well forward of toes, stance more narrow than ideal, mild increase in lateral patellar tracking as knee approaches extension on left compared to right (03/24/2021); knees track toward feet, wide stance, shift away from L LE (04/26/2021);    Time 12    Period Weeks    Status Partially Met      PT LONG TERM GOAL #4   Title Patient will score 1 or less on Lateral Step Down Test  left LE to demonstrate improved strength and LE control to help reduce risk of patellar dislocation during functional activities.    Baseline not tested (03/24/2021); B sides +2 (04/26/2021);    Time 12    Period Weeks    Status Partially Met      PT LONG TERM GOAL #5   Title Complete community, work and/or recreational activities without limitation due to current condition.    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 Continued with increased volume for deep squatting activities and progressed to single leg stance hopping and skipping exercises with good tolerance. Continues to shift away from L LE during double leg squatting activities and demonstrates decreased strength and power  with L LE compared to right. Patient would benefit from continued management of limiting condition by skilled physical therapist to address remaining impairments and functional limitations to work towards stated goals and return to PLOF or maximal functional independence.    Personal Factors and Comorbidities Comorbidity 3+;Time since onset of injury/illness/exacerbation;Fitness;Past/Current Experience;Age    Comorbidities Relevant past medical history and comorbidities include Attention deficit disorder, GERD, hypothyroidism.    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   Stability/Clinical Decision Making Stable/Uncomplicated    Rehab Potential Good    PT Frequency 2x / week    PT Duration 12 weeks    PT Treatment/Interventions ADLs/Self Care Home Management;Cryotherapy;Moist Heat;Electrical Stimulation;Neuromuscular re-education;Balance training;Therapeutic exercise;Therapeutic activities;Patient/family education;Manual techniques;Dry needling;Joint Manipulations;Spinal Manipulations    PT Next Visit Plan hip and knee strength, proprioception exercises, update HEP as appropriate    PT Home Exercise Plan medbridge Access Code: [email or web address]    Consulted and Agree with Plan of Care Patient             Patient will benefit from skilled therapeutic intervention in order to improve the following deficits and impairments:  Improper body mechanics, Pain, Postural dysfunction, Hypermobility, Decreased mobility, Decreased activity tolerance, Decreased endurance, Decreased strength, Impaired perceived functional ability, Difficulty  walking, Impaired flexibility  Visit Diagnosis: Left knee pain, unspecified chronicity  Muscle weakness (generalized)  Difficulty in walking, not elsewhere classified     Problem List Patient Active Problem List   Diagnosis Date Noted   Acute non-recurrent maxillary sinusitis 09/03/2020   Atypical toothache 09/03/2020   Obesity (BMI 30-39.9) 08/13/2020   Annual physical exam 08/12/2020   S/P cesarean section 10/23/2019   VBAC, delivered 09/12/2019   ADD (attention deficit disorder) 06/03/2016   Hypothyroidism 07/10/2014   Everlean Alstrom. Graylon Good, PT, DPT 05/19/21, 6:05 PM  [place] [facility] [address] [place], [address] Phone: [phone]  Fax:  [phone]  Name: Olivia Henderson MRN: [id number] Date of Birth: December 08, 1990

## 2021-05-24 ENCOUNTER — Ambulatory Visit: Payer: 59 | Admitting: Physical Therapy

## 2021-05-24 ENCOUNTER — Encounter: Payer: Self-pay | Admitting: Physical Therapy

## 2021-05-24 DIAGNOSIS — M25562 Pain in left knee: Secondary | ICD-10-CM | POA: Diagnosis not present

## 2021-05-24 DIAGNOSIS — R262 Difficulty in walking, not elsewhere classified: Secondary | ICD-10-CM

## 2021-05-24 DIAGNOSIS — M6281 Muscle weakness (generalized): Secondary | ICD-10-CM | POA: Diagnosis not present

## 2021-05-24 NOTE — Therapy (Signed)
[facility] [address], [place], [address] Phone: [phone]   Fax:  [phone]  Physical Therapy Treatment  Patient Details  Name: Olivia Henderson MRN: [id number] Date of Birth: 1991-06-05 Referring Provider (PT): Rosalia Hammers, DO   Encounter Date: 05/24/2021   PT End of Session - 05/24/21 1517     Visit Number 14    Number of Visits 24    Date for PT Re-Evaluation 06/16/21    Authorization Type [place] [employer] reporting period from 6/27//2022    Progress Note Due on Visit 20    PT Start Time 1515    PT Stop Time 1555    PT Time Calculation (min) 40 min    Activity Tolerance Patient tolerated treatment well    Behavior During Therapy [facility] for tasks assessed/performed             Past Medical History:  Diagnosis Date   Anemia    with first pregnancy   Chicken pox    Complication of anesthesia    nausea and vomiting   COVID-19    11/15/20   GERD (gastroesophageal reflux disease)    Preeclampsia    with G1   Unspecified hypothyroidism 07/10/2014    Past Surgical History:  Procedure Laterality Date   CESAREAN SECTION  2014   WISDOM TOOTH EXTRACTION      There were no vitals filed for this visit.   Subjective Assessment - 05/24/21 1514     Subjective Patient reports she feels well today. She went kayaking and painted the ceiling in her bathroom this weekend. May have been a little sore after last PT session but not bad. Forgot her knee brace today and has not done much PT or HEP without it but often goes without it during her daily life.    Pertinent History Patient is a 30 y.o. female who presents to outpatient physical therapy with a referral for medical diagnosis chronic pain of left knee, ligamentous laxity of left knee, lateral dislocation of lfet patella, sequela, recurrent dislocation of left patella. 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  Relevant past medical history and comorbidities include Attention deficit disorder, GERD, hypothyroidism.  Patient denies hx of cancer, stroke, seizures, lung problem, major cardiac events, diabetes, unexplained weight loss, changes in bowel or bladder problems, new onset stumbling or dropping things, osteoporosis, spine surgeries.    Limitations Standing;Walking;House hold activities;Other (comment)   nervous moving really fast at work and takes more concentration when she is walking all around, walking for fitness/leisure, squatting down, running, jumping, increased caution with how she moves with anything involving left knee.   Diagnostic tests L knee radiograph report 02/24/2021: "negative"    Patient Stated Goals to get her knee better so she doesn't have to have surgery    Currently in Pain? No/denies    Pain Onset More than a month ago             TREATMENT:    Therapeutic exercise: to centralize symptoms and improve ROM, strength, muscular endurance, and activity tolerance required for successful completion of functional activities.   - standing lateral heel tap on 8inch step, 3x10 each side, no  UE support.    Circuit: - box squat to 12 inch step, 3x10 with 40#KB held at goblet squat position, mirror feedback to improve equal weight bearing.  - standing star excursion to 8 points,  no UE support, toes facing forwards, 3x2 each side on airex pad (compliment surface). Mirror feedback   - Jump squat with hands touching floor between legs, 3x5 - broad jumps 5x20 feet (adding arm swing last 2 sets) with mirror feedback to improve form.  - single leg hop with hold, forwards, 1x20 feet each side -  front long push off from forward lunge position, 3x10 each side with U UE support.  - skipping as high as possible with reach overhead, 2x20 feet (patient feels limited in height today due to legs feeling like "jello" after previous exercises.  - side shuffle, 3x20 feet each side.    Pt required multimodal cuing for proper technique and to facilitate improved neuromuscular control, strength, range of motion, and functional ability resulting in improved performance and form.    HOME EXERCISE PROGRAM Access Code: [email or web address] URL: [email or web address] Date: 04/19/2021 Prepared by: Rosita Kea   Exercises Standing Clam with Resistance Loop - 1 x daily - 3 sets - 10 reps Split Squats Upright Trunk with Dumbbells (Quad Bias) - 1 x daily - 3 sets - 10 reps Single Leg Running Balance - 1 x daily - 3 sets - 10 reps    PT Education - 05/24/21 1517     Education Details Exercise purpose/form.    Person(s) Educated Patient    Methods Explanation;Demonstration;Verbal cues    Comprehension Verbalized understanding;Returned demonstration;Verbal cues required;Need further instruction              PT Short Term Goals - 04/05/21 1728       PT SHORT TERM GOAL #1   Title Be independent with initial home exercise program for self-management of symptoms.    Baseline initial HEP provided at IE (03/24/2021);    Time 2    Period Weeks    Status Achieved    Target Date 04/07/21               PT Long Term Goals - 04/26/21 2112       PT LONG TERM GOAL #1   Title Be independent with a long-term home exercise program for self-management of symptoms.    Baseline Initial HEP provided at IE (03/24/2021); currently participating in appropriate HEP (6/27/202);    Time 12    Period Weeks    Status Partially Met   TARGET DATE FOR ALL LONG TERM GOALS: 06/16/2021     PT LONG TERM GOAL #2   Title Demonstrate improved FOTO to equal or greater than 83 by visit #10 to demonstrate  improvement in overall condition and self-reported functional ability.    Baseline 73 (03/24/2021); 84 at visit #8 (6/27/202);    Time 12    Period Weeks    Status Achieved      PT LONG TERM GOAL #3   Title Be able to squat to chair height with proper form without limitation due to current condition in order to improve ability to lift and complete transfers during usual activities.    Baseline patient allows knees to track well forward of toes, stance more narrow than ideal, mild increase in lateral patellar tracking as knee approaches extension on left compared to right (03/24/2021); knees track toward feet, wide stance, shift away from L LE (04/26/2021);    Time 12  Period Weeks    Status Partially Met      PT LONG TERM GOAL #4   Title Patient will score 1 or less on Lateral Step Down Test left LE to demonstrate improved strength and LE control to help reduce risk of patellar dislocation during functional activities.    Baseline not tested (03/24/2021); B sides +2 (04/26/2021);    Time 12    Period Weeks    Status Partially Met      PT LONG TERM GOAL #5   Title Complete community, work and/or recreational activities without limitation due to current condition.    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 Was able to progress exercise intensity and power during the session without wearing her brace. Continues to  demonstrate unequal weight bearing slightly shifted away from L LE during double leg activities and does have reduced strength/power/balance on L LE compared to R LE. Overall strength/power/confidence significantly improved. Patient would benefit from continued management of limiting condition by skilled physical therapist to address remaining impairments and functional limitations to work towards stated goals and return to PLOF or maximal functional independence.    Personal Factors and Comorbidities Comorbidity 3+;Time since onset of injury/illness/exacerbation;Fitness;Past/Current Experience;Age    Comorbidities Relevant past medical history and comorbidities include Attention deficit disorder, GERD, hypothyroidism.    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   Stability/Clinical Decision Making Stable/Uncomplicated    Rehab Potential Good    PT Frequency 2x / week    PT Duration 12 weeks    PT Treatment/Interventions ADLs/Self Care Home Management;Cryotherapy;Moist Heat;Electrical Stimulation;Neuromuscular re-education;Balance training;Therapeutic exercise;Therapeutic activities;Patient/family education;Manual techniques;Dry needling;Joint Manipulations;Spinal Manipulations    PT Next Visit Plan hip and knee strength, proprioception exercises, update HEP as appropriate    PT Home Exercise Plan medbridge Access Code: [email or web address]    Consulted and Agree with Plan of Care Patient             Patient will benefit from skilled therapeutic intervention in order to improve the following deficits and impairments:  Improper body  mechanics, Pain, Postural dysfunction, Hypermobility, Decreased mobility, Decreased activity tolerance, Decreased endurance, Decreased strength, Impaired perceived functional ability, Difficulty walking, Impaired flexibility  Visit Diagnosis: Left knee pain, unspecified chronicity  Muscle weakness (generalized)  Difficulty in walking, not elsewhere classified     Problem List Patient Active Problem List   Diagnosis Date Noted   Acute non-recurrent maxillary sinusitis 09/03/2020   Atypical toothache 09/03/2020   Obesity (BMI 30-39.9) 08/13/2020   Annual physical exam 08/12/2020   S/P cesarean section 10/23/2019   VBAC, delivered 09/12/2019   ADD (attention deficit disorder) 06/03/2016  Hypothyroidism 07/10/2014    Everlean Alstrom. Graylon Good, PT, DPT 05/25/21, 9:17 AM  [place] [facility] [address] [place], [address] Phone: [phone]   Fax:  [phone]  Name: Olivia Henderson MRN: [id number] Date of Birth: [birth year]

## 2021-05-26 ENCOUNTER — Ambulatory Visit: Payer: 59 | Admitting: Physical Therapy

## 2021-06-01 ENCOUNTER — Ambulatory Visit: Payer: 59 | Admitting: Physical Therapy

## 2021-06-03 ENCOUNTER — Encounter: Payer: 59 | Admitting: Physical Therapy

## 2021-06-03 ENCOUNTER — Ambulatory Visit: Payer: 59 | Admitting: Physical Therapy

## 2021-06-03 ENCOUNTER — Other Ambulatory Visit: Payer: Self-pay

## 2021-06-03 MED FILL — Levothyroxine Sodium Tab 100 MCG: ORAL | 90 days supply | Qty: 90 | Fill #1 | Status: AC

## 2021-06-08 ENCOUNTER — Ambulatory Visit: Payer: 59 | Admitting: Physical Therapy

## 2021-06-10 ENCOUNTER — Ambulatory Visit: Payer: 59 | Admitting: Physical Therapy

## 2021-06-28 ENCOUNTER — Telehealth: Payer: Self-pay | Admitting: Internal Medicine

## 2021-06-28 NOTE — Telephone Encounter (Signed)
Patient informed, Due to the high volume of calls and your symptoms we have to forward your call to our Triage Nurse to expedient your call. Please hold for the transfer.  Patient transferred to Access Nurse. Due to having body aches,chills and a sore throat that started yesterday.No openings in office or virtual.

## 2021-06-29 ENCOUNTER — Encounter: Payer: Self-pay | Admitting: Family

## 2021-06-29 ENCOUNTER — Telehealth (INDEPENDENT_AMBULATORY_CARE_PROVIDER_SITE_OTHER): Payer: 59 | Admitting: Family

## 2021-06-29 ENCOUNTER — Other Ambulatory Visit: Payer: Self-pay

## 2021-06-29 VITALS — Ht 62.01 in | Wt 187.0 lb

## 2021-06-29 DIAGNOSIS — J0100 Acute maxillary sinusitis, unspecified: Secondary | ICD-10-CM

## 2021-06-29 MED ORDER — FLUCONAZOLE 150 MG PO TABS
150.0000 mg | ORAL_TABLET | Freq: Once | ORAL | 1 refills | Status: AC
  Filled 2021-06-29: qty 2, 3d supply, fill #0

## 2021-06-29 MED ORDER — AMOXICILLIN-POT CLAVULANATE 875-125 MG PO TABS
1.0000 | ORAL_TABLET | Freq: Two times a day (BID) | ORAL | 0 refills | Status: AC
  Filled 2021-06-29: qty 14, 7d supply, fill #0

## 2021-06-29 NOTE — Assessment & Plan Note (Signed)
Chills resolved. She is nontoxic in appearance. Sore throat is most bothersome and conservative therapy not effective.  Advised reasonable to start Augmentin in the next 1 to 2 days if symptoms do not improve.  Advised to continue Mucinex with plenty water.  We also discussed if the PCR COVID were to come back positive the symptoms could be attributed certainly to COVID.  We discussed the option of antiviral;  patient politely declines at this time.  She feels like this infection reminds her more of previous sinus infection she has had in the past.  Shehas tolerated Augmentin well in the past with the exception of an occasional yeast infection.  I provided her with Diflucan if needed.  She will call me with any concerns.

## 2021-06-29 NOTE — Progress Notes (Signed)
Virtual Visit via Video Note  I connected with@  on 06/29/21 at  9:00 AM EDT by a video enabled telemedicine application and verified that I am speaking with the correct person using two identifiers.  Location patient: home Location provider:work  Persons participating in the virtual visit: patient, provider  I discussed the limitations of evaluation and management by telemedicine and the availability of in person appointments. The patient expressed understanding and agreed to proceed.   HPI:  Acute visit  Complains of bodyaches, chills x 3 days, waxes and waning. Sore throat is bothering her the most. Chills, bodyaches have resolved.  Endorses thick ,sinus drainage and PND, sore throat, ears are 'stopped up'.  Tmax 100. No white patches.  No persistent cough, HA, facial pain, facial swelling, sob, She has been taking mucinex without relief.  Covid test home negative. PCR cone today to return.   No recent antibiotic.  Covid vaccinated 2/3   ROS: See pertinent positives and negatives per HPI.    EXAM:  VITALS per patient if applicable: Ht 5' 2.01" (1.575 m)   Wt 187 lb (84.8 kg)   BMI 34.19 kg/m  BP Readings from Last 3 Encounters:  03/30/21 112/78  02/24/21 118/82  02/23/21 120/80   Wt Readings from Last 3 Encounters:  06/29/21 187 lb (84.8 kg)  02/24/21 191 lb 3.2 oz (86.7 kg)  02/23/21 192 lb (87.1 kg)    GENERAL: alert, oriented, appears well and in no acute distress  HEENT: atraumatic, conjunttiva clear, no obvious abnormalities on inspection of external nose and ears  NECK: normal movements of the head and neck  LUNGS: on inspection no signs of respiratory distress, breathing rate appears normal, no obvious gross SOB, gasping or wheezing  CV: no obvious cyanosis  MS: moves all visible extremities without noticeable abnormality  PSYCH/NEURO: pleasant and cooperative, no obvious depression or anxiety, speech and thought processing grossly  intact  ASSESSMENT AND PLAN:  Discussed the following assessment and plan:  Problem List Items Addressed This Visit       Respiratory   Acute non-recurrent maxillary sinusitis - Primary    Chills resolved. She is nontoxic in appearance. Sore throat is most bothersome and conservative therapy not effective.  Advised reasonable to start Augmentin in the next 1 to 2 days if symptoms do not improve.  Advised to continue Mucinex with plenty water.  We also discussed if the PCR COVID were to come back positive the symptoms could be attributed certainly to COVID.  We discussed the option of antiviral;  patient politely declines at this time.  She feels like this infection reminds her more of previous sinus infection she has had in the past.  Shehas tolerated Augmentin well in the past with the exception of an occasional yeast infection.  I provided her with Diflucan if needed.  She will call me with any concerns.      Relevant Medications   amoxicillin-clavulanate (AUGMENTIN) 875-125 MG tablet   fluconazole (DIFLUCAN) 150 MG tablet    -we discussed possible serious and likely etiologies, options for evaluation and workup, limitations of telemedicine visit vs in person visit, treatment, treatment risks and precautions. Pt prefers to treat via telemedicine empirically rather then risking or undertaking an in person visit at this moment.  .   I discussed the assessment and treatment plan with the patient. The patient was provided an opportunity to ask questions and all were answered. The patient agreed with the plan and demonstrated an understanding of  the instructions.   The patient was advised to call back or seek an in-person evaluation if the symptoms worsen or if the condition fails to improve as anticipated.   Mable Paris, FNP

## 2021-06-29 NOTE — Telephone Encounter (Signed)
Patient has virtual visit scheduled with NP.

## 2021-06-29 NOTE — Patient Instructions (Signed)
Continue Mucinex with plenty water.  You  may start Augmentin as we discussed in 1 to 2 days if symptoms have not improved.  Ensure to take probiotics while on antibiotics and also for 2 weeks after completion. This can either be by eating yogurt daily or taking a probiotic supplement over the counter such as Culturelle.It is important to re-colonize the gut with good bacteria and also to prevent any diarrheal infections associated with antibiotic use.   Let me know if you need anything at all

## 2021-07-03 IMAGING — DX DG KNEE COMPLETE 4+V*L*
5 series · 5 of 5 positions shown · non-contrast
Comparison: None.

CLINICAL DATA: Left knee pain

EXAM:
LEFT KNEE - COMPLETE 4+ VIEW

[knee standing ap]
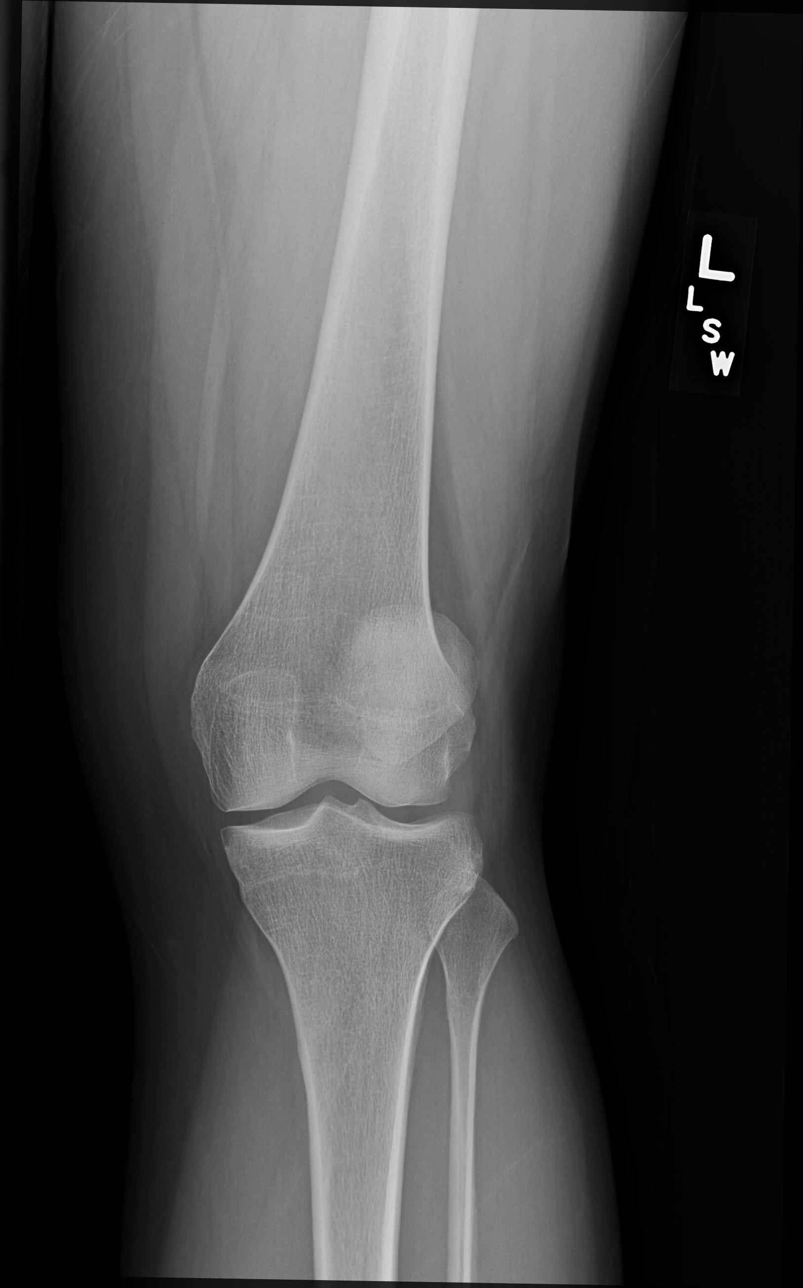

[knee standing external ap]
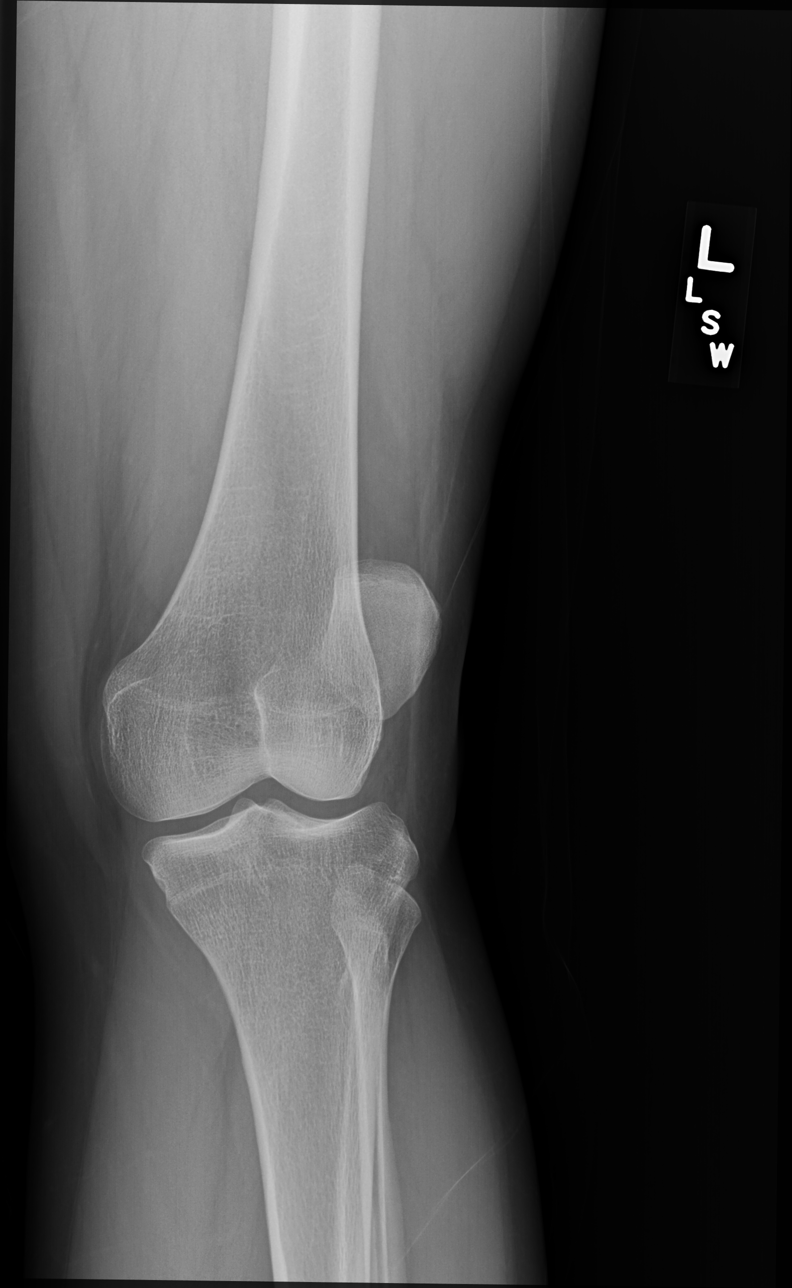

[knee standing internal ap]
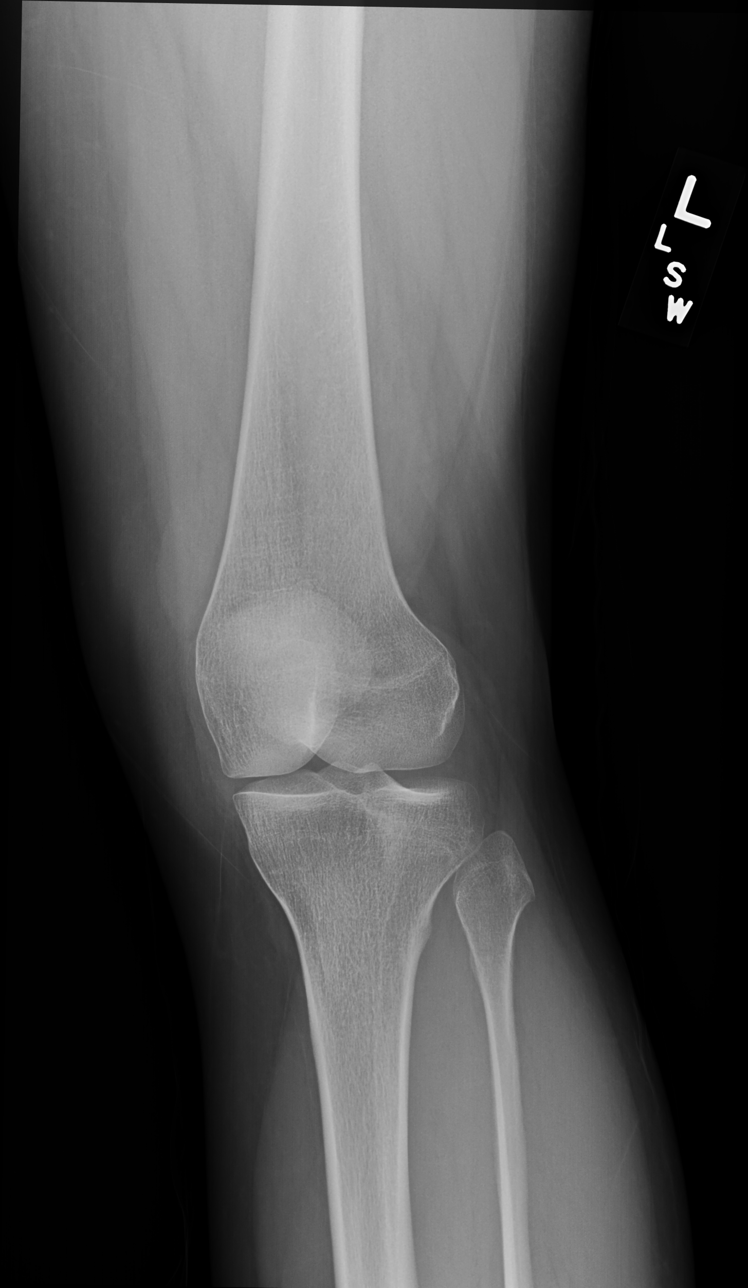

[knee standing lat]
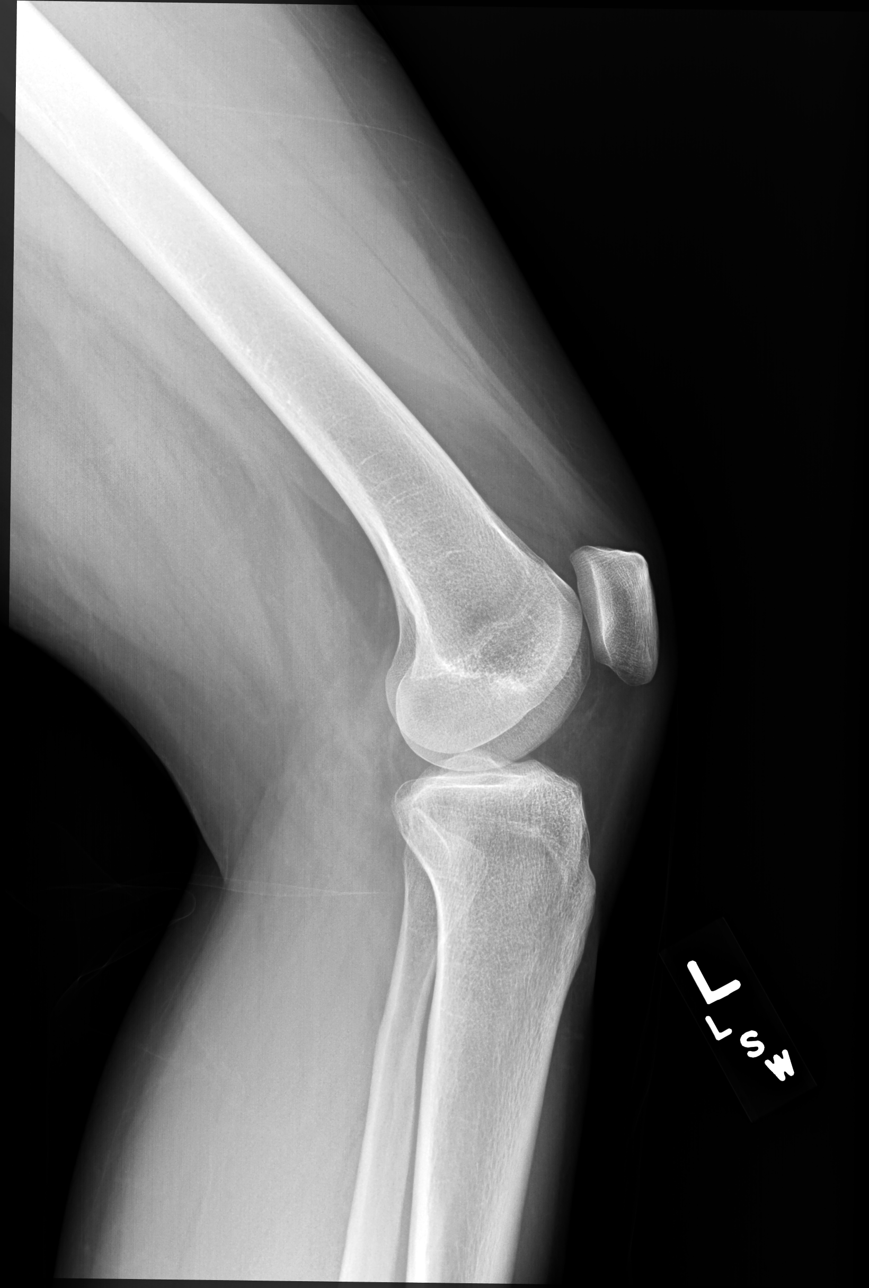

[knee [person_name] view pa]
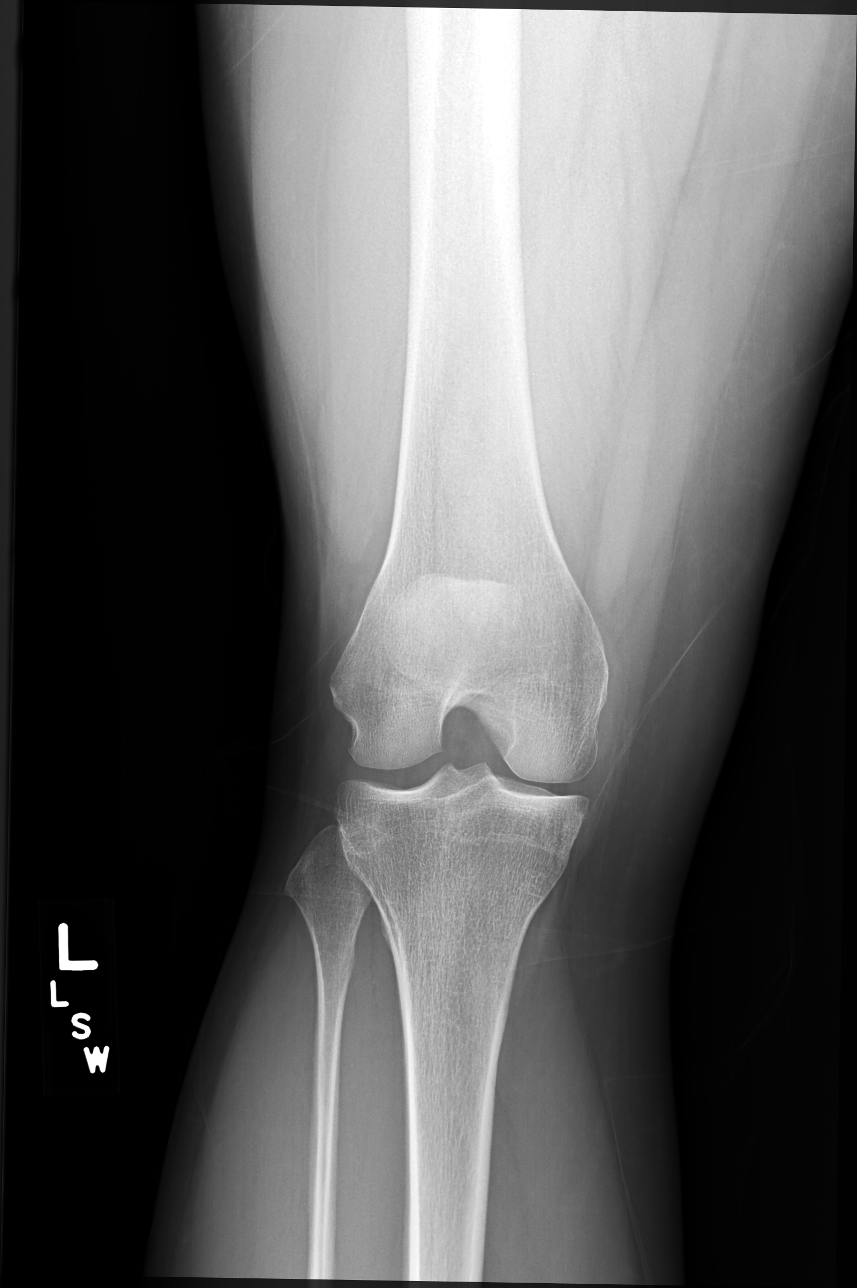

[5 of 5 positions shown; findings below may reference images not displayed]

FINDINGS: No evidence of fracture, dislocation, or joint effusion. No evidence
of arthropathy or other focal bone abnormality. Soft tissues are
unremarkable.
IMPRESSION: Negative.

## 2021-07-06 ENCOUNTER — Telehealth (INDEPENDENT_AMBULATORY_CARE_PROVIDER_SITE_OTHER): Payer: 59 | Admitting: Family Medicine

## 2021-07-06 ENCOUNTER — Other Ambulatory Visit: Payer: Self-pay

## 2021-07-06 DIAGNOSIS — R059 Cough, unspecified: Secondary | ICD-10-CM

## 2021-07-06 MED ORDER — BENZONATATE 200 MG PO CAPS
200.0000 mg | ORAL_CAPSULE | Freq: Two times a day (BID) | ORAL | 0 refills | Status: DC | PRN
  Filled 2021-07-06: qty 20, 10d supply, fill #0

## 2021-07-06 NOTE — Progress Notes (Signed)
Virtual Visit via Telephone Note  I connected with Olivia Henderson on 07/06/21 at  1:20 PM EDT by telephone and verified that I am speaking with the correct person using two identifiers.   I discussed the limitations, risks, security and privacy concerns of performing an evaluation and management service by telephone and the availability of in person appointments. I also discussed with the patient that there may be a patient responsible charge related to this service. The patient expressed understanding and agreed to proceed.  Location patient: home, [place] Location provider: work or home office Participants present for the call: patient, provider Patient did not have a visit with me in the prior 7 days to address this/these issue(s).   History of Present Illness:  Acute telemedicine visit for a "sinus infection": -Onset: 9 days ago; covid testing at home and at work were negative -did a VV about 1 week ago for flu like symptoms with sinus issues and was started on Augmentin which helped the sinus issues -still has pnd and cough -Denies: fevers, CP, SOB, inability to eat/drink/get out of bed, NVD, body aches -Has tried: Augmentin -Pertinent past medical history:see below -Pertinent medication allergies:  Allergies  Allergen Reactions   Adipex-P [Phentermine]     Jittery   Contrave [Naltrexone-Bupropion Hcl Er]     H/a  -COVID-19 vaccine status: vaccinated  -denies any chance of pregnancy  Past Medical History:  Diagnosis Date   Anemia    with first pregnancy   Chicken pox    Complication of anesthesia    nausea and vomiting   COVID-19    11/15/20   GERD (gastroesophageal reflux disease)    Preeclampsia    with G1   Unspecified hypothyroidism 07/10/2014     Observations/Objective: Patient sounds cheerful and well on the phone. I do not appreciate any SOB. Speech and thought processing are grossly intact. Patient reported vitals:  Assessment and  Plan:  Cough  -we discussed possible serious and likely etiologies, options for evaluation and workup, limitations of telemedicine visit vs in person visit, treatment, treatment risks and precautions. Pt prefers to treat via telemedicine empirically rather than in person at this moment.  Suspect postinfectious cough.  Initial infection could have been influenza, COVID-19 with false negative testing, versus other illness.  Opted to try nasal saline twice daily, short course of Afrin nasal decongestant for 3 days and Tessalon for cough. Advised to seek prompt in person care if worsening, new symptoms arise, or if is not improving with treatment. Advised of options for inperson care in case PCP office not available. Did let the patient know that I only do telemedicine shifts for [facility] on Tuesdays and Thursdays and advised a follow up visit with PCP or at an [facility] if has further questions or concerns.   Follow Up Instructions:  I did not refer this patient for an OV with me in the next 24 hours for this/these issue(s).  I discussed the assessment and treatment plan with the patient. The patient was provided an opportunity to ask questions and all were answered. The patient agreed with the plan and demonstrated an understanding of the instructions.   I spent 12 minutes on the date of this visit in the care of this patient. See summary of tasks completed to properly care for this patient in the detailed notes above which also included counseling of above, review of PMH, medications, allergies, evaluation of the patient and ordering and/or  instructing patient on testing and care  options.     Terressa Koyanagi, DO

## 2021-07-06 NOTE — Patient Instructions (Signed)
-  I sent the medication(s) we discussed to your pharmacy: Meds ordered this encounter  Medications   benzonatate (TESSALON) 200 MG capsule    Sig: Take 1 capsule (200 mg total) by mouth 2 (two) times daily as needed for cough.    Dispense:  20 capsule    Refill:  0   Nasal saline twice daily  3 day course of Afrin nasal spray  Please drink plenty of fluids and avoid dairy products until you are feeling all better.  I hope you are feeling better soon!  Seek in person care promptly if your symptoms worsen, new concerns arise or you are not improving with treatment.  It was nice to meet you today. I help [facility] out with telemedicine visits on Tuesdays and Thursdays and am available for visits on those days. If you have any concerns or questions following this visit please schedule a follow up visit with your Primary Care doctor or seek care at a local urgent care clinic to avoid delays in care.

## 2021-07-27 ENCOUNTER — Telehealth: Payer: Self-pay | Admitting: Internal Medicine

## 2021-07-27 NOTE — Telephone Encounter (Signed)
Called to speak with Olivia Henderson to triage. Pt states that she has been experiencing Jaw pain since August and [facility]. She was prescribed Augmentin and has completed the course. Pt states that the augmentin has "opened her up" and allowed her sinuses to drain. Pt is now complaining due to excessive drainage that is causing her to cough constantly. Pt states that she is waking up at night due to constant drainage. Drainage is yellow in color. Pt was seen for a virtual visit on 07/06/21 for the cough and was given Tessalon Pearls. Pt states that they have not helped and she instead has taken Robutussin with Honey. Pt states that the left side of her throat is hurting ,her ears feel stopped up, and is having no nasal or sinus pain. She asks if there is any medication that can be given to assist with the cough. Pt has already tried Delsym and Robutussin and states that they do not work well.

## 2021-07-27 NOTE — Telephone Encounter (Signed)
Reviewed chart.  She has had two video visits.  Given has had abx and not responding to other medications, she needs in person evaluation.

## 2021-07-27 NOTE — Telephone Encounter (Signed)
Patient informed, Due to the high volume of calls and your symptoms we have to forward your call to our Triage Nurse to expedient your call. Please hold for the transfer.  Patient transferred to Access Nurse. Due to having a cough and coughing up drainage in the morning for the past month after being seen and prescribed augumentin and tessalon pearls.No openings in office or virtual.

## 2021-07-28 ENCOUNTER — Other Ambulatory Visit: Payer: Self-pay

## 2021-07-28 ENCOUNTER — Telehealth: Payer: 59 | Admitting: Family Medicine

## 2021-07-28 DIAGNOSIS — R059 Cough, unspecified: Secondary | ICD-10-CM

## 2021-07-28 MED ORDER — PROMETHAZINE-DM 6.25-15 MG/5ML PO SYRP
2.5000 mL | ORAL_SOLUTION | Freq: Three times a day (TID) | ORAL | 0 refills | Status: DC | PRN
  Filled 2021-07-28: qty 118, 15d supply, fill #0

## 2021-07-28 NOTE — Patient Instructions (Signed)
I appreciate the opportunity to provide you with care for your health and wellness.  Take Mucinex twice daily for 7-14 days Use cough syrup as needed Drink 2 liters of fluid daily  Try the neti pot to wash out the uterus  Please continue to practice social distancing to keep you, your family, and our community safe.  If you must go out, please wear a mask and practice good handwashing.  Have a wonderful day. With Gratitude, Tereasa Coop, DNP, AGNP-BC

## 2021-07-28 NOTE — Progress Notes (Signed)
Olivia Henderson are scheduled for a virtual visit with your provider today.    Just as we do with appointments in the office, we must obtain your consent to participate.  Your consent will be active for this visit and any virtual visit you may have with one of our providers in the next 365 days.    If you have a MyChart account, I can also send a copy of this consent to you electronically.  All virtual visits are billed to your insurance company just like a traditional visit in the office.  As this is a virtual visit, video technology does not allow for your provider to perform a traditional examination.  This may limit your provider's ability to fully assess your condition.  If your provider identifies any concerns that need to be evaluated in person or the need to arrange testing such as labs, EKG, etc, we will make arrangements to do so.    Although advances in technology are sophisticated, we cannot ensure that it will always work on either your end or our end.  If the connection with a video visit is poor, we may have to switch to a telephone visit.  With either a video or telephone visit, we are not always able to ensure that we have a secure connection.   I need to obtain your verbal consent now.   Are you willing to proceed with your visit today?   Olivia Henderson has provided verbal consent on 07/28/2021 for a virtual visit (video or telephone).   Freddy Finner, NP 07/28/2021  2:28 PM   Date:  07/28/2021   ID:  Olivia Henderson, DOB [birth year], MRN [id number]  Patient Location: Home Provider Location: Home Office   Participants: Patient and Provider for Visit and Wrap up  Method of visit: Video  Location of Patient: Home Location of Provider: Home Office Consent was obtain for visit over the video. Services rendered by provider: Visit was performed via video  A video enabled telemedicine application was used and I verified that I am speaking with the correct person  using two identifiers.  PCP:  McLean-Scocuzza, Pasty Spillers, MD   Chief Complaint:  cough and drainage  History of Present Illness:    Olivia Henderson is a 30 y.o. female with history as stated below. Presents video telehealth for on going cough post drainage from treatment of sinus infection.   Per Call to PCP office today: "She was prescribed Augmentin and has completed the course. Pt states that the augmentin has "opened her up" and allowed her sinuses to drain. Pt is now complaining due to excessive drainage that is causing her to cough constantly. Pt states that she is waking up at night due to constant drainage. Drainage is yellow in color. Pt was seen for a virtual visit on 07/06/21 for the cough and was given Tessalon Pearls. Pt states that they have not helped and she instead has taken Robutussin with Honey. Pt states that the left side of her throat is hurting ,her ears feel stopped up, and is having no nasal or sinus pain. She asks if there is any medication that can be given to assist with the cough. Pt has already tried Delsym and Robutussin and states that they do not work well".  Denies having fevers, chills, shortness of breath, chest pain,  exposure to covid or other sick contacts. Modifying factors include: mucinex DM No other aggravating or relieving factors.  No other c/o.  Past  Medical, Surgical, Social History, Allergies, and Medications have been Reviewed.  Past Medical History:  Diagnosis Date   Anemia    with first pregnancy   Chicken pox    Complication of anesthesia    nausea and vomiting   COVID-19    11/15/20   GERD (gastroesophageal reflux disease)    Preeclampsia    with G1   Unspecified hypothyroidism 07/10/2014    No outpatient medications have been marked as taking for the 07/28/21 encounter (Video Visit) with [facility] PROVIDER.     Allergies:   Adipex-p [phentermine] and Contrave [naltrexone-bupropion hcl er]   ROS See HPI for history of  present illness.  Physical Exam Constitutional:      Appearance: Normal appearance.  HENT:     Head: Normocephalic.     Right Ear: External ear normal.     Left Ear: External ear normal.     Nose: Nose normal.  Pulmonary:     Effort: Pulmonary effort is normal.     Comments: No shortness of breath   Cough present  Neurological:     Mental Status: She is alert.              A&P  1. Cough  -fluids, OTC treatments recommended -prometh DM provided -neti poti encouraged  -rest   -no work note needed   - promethazine-dextromethorphan (PROMETHAZINE-DM) 6.25-15 MG/5ML syrup; Take 2.5 mLs by mouth 3 (three) times daily as needed for cough.  Dispense: 118 mL; Refill: 0    Reviewed side effects, risks and benefits of medication.    Patient acknowledged agreement and understanding of the plan.   I discussed the assessment and treatment plan with the patient. The patient was provided an opportunity to ask questions and all were answered. The patient agreed with the plan and demonstrated an understanding of the instructions.   The patient was advised to call back or seek an in-person evaluation if the symptoms worsen or if the condition fails to improve as anticipated.   The above assessment and management plan was discussed with the patient. The patient verbalized understanding of and has agreed to the management plan. Patient is aware to call the clinic if symptoms persist or worsen. Patient is aware when to return to the clinic for a follow-up visit. Patient educated on when it is appropriate to go to the emergency department.   Time:   Today, I have spent 10 minutes with the patient with telehealth technology discussing the above problems, reviewing the chart, previous notes, medications and orders.   Medication Changes: No orders of the defined types were placed in this encounter.    Disposition:  Follow up PCP Signed, Freddy Finner, NP  07/28/2021 2:28 PM

## 2021-07-28 NOTE — Telephone Encounter (Signed)
Called to speak with Olivia Henderson. [name] states that she will reach out to our virtual urgent care and get an appointment for her cough through MyChart. Pt has an appointment scheduled for 08/26/21 with Dr. Quentin Ore.

## 2021-08-04 ENCOUNTER — Other Ambulatory Visit: Payer: Self-pay

## 2021-08-04 ENCOUNTER — Encounter: Payer: Self-pay | Admitting: Podiatry

## 2021-08-04 ENCOUNTER — Ambulatory Visit (INDEPENDENT_AMBULATORY_CARE_PROVIDER_SITE_OTHER): Payer: 59

## 2021-08-04 ENCOUNTER — Ambulatory Visit: Payer: 59 | Admitting: Podiatry

## 2021-08-04 ENCOUNTER — Other Ambulatory Visit: Payer: Self-pay | Admitting: Podiatry

## 2021-08-04 DIAGNOSIS — M722 Plantar fascial fibromatosis: Secondary | ICD-10-CM

## 2021-08-04 DIAGNOSIS — M7732 Calcaneal spur, left foot: Secondary | ICD-10-CM

## 2021-08-04 MED ORDER — MELOXICAM 15 MG PO TABS
15.0000 mg | ORAL_TABLET | Freq: Every day | ORAL | 3 refills | Status: DC
  Filled 2021-08-04: qty 30, 30d supply, fill #0

## 2021-08-04 NOTE — Patient Instructions (Addendum)
Look for a Tuli's Heel cup or silicone heel pad sleeve on [employer] to take pressure off   If it's not better by November come see me for an injection

## 2021-08-09 ENCOUNTER — Encounter: Payer: Self-pay | Admitting: Podiatry

## 2021-08-09 NOTE — Progress Notes (Signed)
  Subjective:  Patient ID: Olivia Henderson, female    DOB: 05-10-1991,  MRN: [id number]  Chief Complaint  Patient presents with   Foot Pain    Patient presents today for left heel pain lateral side x 1 month    30 y.o. female presents with the above complaint. History confirmed with patient.  Painful to stand on the morning of burns and feeling a stabbing pain.  She tried Tylenol and stretching  Objective:  Physical Exam: warm, good capillary refill, no trophic changes or ulcerative lesions, normal DP and PT pulses, and normal sensory exam. Left Foot: point tenderness over the heel pad and point tenderness of the mid plantar fascia  Radiographs: Multiple views x-ray of the left foot: no fracture, dislocation, swelling or degenerative changes noted and plantar calcaneal spur Assessment:   1. Heel spur, left   2. Plantar fasciitis of left foot      Plan:  Patient was evaluated and treated and all questions answered.  Discussed the etiology and treatment options for plantar fasciitis including stretching, formal physical therapy, supportive shoegears such as a running shoe or sneaker, pre fabricated orthoses, injection therapy, and oral medications. We also discussed the role of surgical treatment of this for patients who do not improve after exhausting non-surgical treatment options.   -XR reviewed with patient -Educated patient on stretching and icing of the affected limb -Injection delivered to the plantar fascia of the left foot. -Rx for meloxicam. Educated on use, risks and benefits of the medication  After sterile prep with povidone-iodine solution and alcohol, the left heel was injected with 0.5cc 2% xylocaine plain, 0.5cc 0.5% marcaine plain, 5mg  triamcinolone acetonide, and 2mg  dexamethasone was injected along the medial plantar fascia at the insertion on the plantar calcaneus. The patient tolerated the procedure well without complication.  Return if symptoms worsen  or fail to improve.

## 2021-08-26 ENCOUNTER — Other Ambulatory Visit: Payer: Self-pay

## 2021-08-26 ENCOUNTER — Telehealth: Payer: Self-pay | Admitting: Internal Medicine

## 2021-08-26 ENCOUNTER — Encounter: Payer: 59 | Admitting: Internal Medicine

## 2021-08-26 DIAGNOSIS — E039 Hypothyroidism, unspecified: Secondary | ICD-10-CM

## 2021-08-26 NOTE — Telephone Encounter (Signed)
Patient came in sick for her CPE today. Rescheduled appointment. She needs refill on her levothyroxine (SYNTHROID) 100 MCG tablet. 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

## 2021-08-27 ENCOUNTER — Other Ambulatory Visit: Payer: Self-pay

## 2021-08-27 MED ORDER — LEVOTHYROXINE SODIUM 100 MCG PO TABS
ORAL_TABLET | Freq: Every day | ORAL | 1 refills | Status: DC
  Filled 2021-08-27: qty 90, 90d supply, fill #0

## 2021-08-27 NOTE — Telephone Encounter (Signed)
Medication sent in to preferred pharmacy per protocol.   Patient scheduled for a physical 09/2021. Wanting labs done beforehand to discuss at appointment. Last labs done 08/13/20.   What labs needed for Patient? Needing to be ordered for the [facility] hospital lab.

## 2021-08-30 ENCOUNTER — Other Ambulatory Visit: Payer: Self-pay | Admitting: Internal Medicine

## 2021-08-30 DIAGNOSIS — E039 Hypothyroidism, unspecified: Secondary | ICD-10-CM

## 2021-08-30 DIAGNOSIS — Z0000 Encounter for general adult medical examination without abnormal findings: Secondary | ICD-10-CM

## 2021-08-30 DIAGNOSIS — E559 Vitamin D deficiency, unspecified: Secondary | ICD-10-CM

## 2021-08-30 DIAGNOSIS — Z1389 Encounter for screening for other disorder: Secondary | ICD-10-CM

## 2021-08-30 DIAGNOSIS — E538 Deficiency of other specified B group vitamins: Secondary | ICD-10-CM

## 2021-08-30 DIAGNOSIS — E785 Hyperlipidemia, unspecified: Secondary | ICD-10-CM

## 2021-08-30 NOTE — Telephone Encounter (Signed)
Labs ordered [facility] have pt be fasting please  Orders in

## 2021-08-30 NOTE — Telephone Encounter (Signed)
Patient informed and verbalized understanding

## 2021-10-11 ENCOUNTER — Encounter: Payer: Self-pay | Admitting: Internal Medicine

## 2021-10-11 ENCOUNTER — Other Ambulatory Visit
Admission: RE | Admit: 2021-10-11 | Discharge: 2021-10-11 | Disposition: A | Discharge status: Home / Self Care | Payer: 59 | Attending: Internal Medicine | Admitting: Internal Medicine

## 2021-10-11 DIAGNOSIS — E785 Hyperlipidemia, unspecified: Secondary | ICD-10-CM | POA: Diagnosis not present

## 2021-10-11 DIAGNOSIS — Z0000 Encounter for general adult medical examination without abnormal findings: Secondary | ICD-10-CM | POA: Insufficient documentation

## 2021-10-11 DIAGNOSIS — E538 Deficiency of other specified B group vitamins: Secondary | ICD-10-CM | POA: Diagnosis not present

## 2021-10-11 DIAGNOSIS — Z1389 Encounter for screening for other disorder: Secondary | ICD-10-CM | POA: Diagnosis not present

## 2021-10-11 DIAGNOSIS — E781 Pure hyperglyceridemia: Secondary | ICD-10-CM | POA: Insufficient documentation

## 2021-10-11 DIAGNOSIS — E039 Hypothyroidism, unspecified: Secondary | ICD-10-CM | POA: Insufficient documentation

## 2021-10-11 DIAGNOSIS — E559 Vitamin D deficiency, unspecified: Secondary | ICD-10-CM | POA: Insufficient documentation

## 2021-10-11 LAB — URINALYSIS, ROUTINE W REFLEX MICROSCOPIC
Bilirubin Urine: NEGATIVE
Glucose, UA: NEGATIVE mg/dL
Ketones, ur: NEGATIVE mg/dL
Leukocytes,Ua: NEGATIVE
Nitrite: NEGATIVE
Protein, ur: NEGATIVE mg/dL
Specific Gravity, Urine: 1.023 (ref 1.005–1.030)
pH: 5 (ref 5.0–8.0)

## 2021-10-11 LAB — COMPREHENSIVE METABOLIC PANEL
ALT: 34 U/L (ref 0–44)
AST: 30 U/L (ref 15–41)
Albumin: 4.5 g/dL (ref 3.5–5.0)
Alkaline Phosphatase: 82 U/L (ref 38–126)
Anion gap: 6 (ref 5–15)
BUN: 14 mg/dL (ref 6–20)
CO2: 23 mmol/L (ref 22–32)
Calcium: 9.3 mg/dL (ref 8.9–10.3)
Chloride: 106 mmol/L (ref 98–111)
Creatinine, Ser: 0.79 mg/dL (ref 0.44–1.00)
GFR, Estimated: >60 mL/min (ref >60)
Glucose, Bld: 100 mg/dL — ABNORMAL HIGH (ref 70–99)
Potassium: 3.9 mmol/L (ref 3.5–5.1)
Sodium: 135 mmol/L (ref 135–145)
Total Bilirubin: 1.2 mg/dL (ref 0.3–1.2)
Total Protein: 7.6 g/dL (ref 6.5–8.1)

## 2021-10-11 LAB — CBC WITH DIFFERENTIAL/PLATELET
Abs Immature Granulocytes: 0.01 10*3/uL (ref 0.00–0.07)
Basophils Absolute: 0 10*3/uL (ref 0.0–0.1)
Basophils Relative: 0 %
Eosinophils Absolute: 0 10*3/uL (ref 0.0–0.5)
Eosinophils Relative: 1 %
HCT: 38.5 % (ref 36.0–46.0)
Hemoglobin: 14.2 g/dL (ref 12.0–15.0)
Immature Granulocytes: 0 %
Lymphocytes Relative: 30 %
Lymphs Abs: 1.8 10*3/uL (ref 0.7–4.0)
MCH: 33.6 pg (ref 26.0–34.0)
MCHC: 36.9 g/dL — ABNORMAL HIGH (ref 30.0–36.0)
MCV: 91 fL (ref 80.0–100.0)
Monocytes Absolute: 0.4 10*3/uL (ref 0.1–1.0)
Monocytes Relative: 6 %
Neutro Abs: 3.7 10*3/uL (ref 1.7–7.7)
Neutrophils Relative %: 63 %
Platelets: 179 10*3/uL (ref 150–400)
RBC: 4.23 MIL/uL (ref 3.87–5.11)
RDW: 12.2 % (ref 11.5–15.5)
WBC: 6 10*3/uL (ref 4.0–10.5)
nRBC: 0 % (ref 0.0–0.2)

## 2021-10-11 LAB — LIPID PANEL
Cholesterol: 176 mg/dL (ref 0–200)
HDL: 36 mg/dL — ABNORMAL LOW (ref >40)
LDL Cholesterol: 104 mg/dL — ABNORMAL HIGH (ref 0–99)
Total CHOL/HDL Ratio: 4.9 RATIO
Triglycerides: 180 mg/dL — ABNORMAL HIGH (ref <150)
VLDL: 36 mg/dL (ref 0–40)

## 2021-10-11 LAB — VITAMIN B12: Vitamin B-12: 538 pg/mL (ref 180–914)

## 2021-10-11 LAB — TSH: TSH: 2.963 u[IU]/mL (ref 0.350–4.500)

## 2021-10-11 LAB — VITAMIN D 25 HYDROXY (VIT D DEFICIENCY, FRACTURES): Vit D, 25-Hydroxy: 28.8 ng/mL — ABNORMAL LOW (ref 30–100)

## 2021-10-12 ENCOUNTER — Telehealth: Payer: Self-pay

## 2021-10-12 ENCOUNTER — Other Ambulatory Visit: Payer: Self-pay

## 2021-10-12 ENCOUNTER — Telehealth: Payer: 59 | Admitting: Family Medicine

## 2021-10-12 DIAGNOSIS — J0140 Acute pansinusitis, unspecified: Secondary | ICD-10-CM | POA: Diagnosis not present

## 2021-10-12 MED ORDER — DOXYCYCLINE HYCLATE 100 MG PO TABS
100.0000 mg | ORAL_TABLET | Freq: Two times a day (BID) | ORAL | 0 refills | Status: AC
  Filled 2021-10-12: qty 20, 10d supply, fill #0

## 2021-10-12 MED ORDER — AMOXICILLIN-POT CLAVULANATE 875-125 MG PO TABS
1.0000 | ORAL_TABLET | Freq: Two times a day (BID) | ORAL | 0 refills | Status: DC
  Filled 2021-10-12: qty 14, 7d supply, fill #0

## 2021-10-12 NOTE — Telephone Encounter (Signed)
-----   Message from Bevelyn Buckles, MD sent at 10/11/2021 10:49 AM EST ----- Urine some blood was she on cycle? Any uti symptoms?  B12 normal  Vitamin D low rec D3 4000 to 5000 Iu daily if pregnant/breast feeding max dose is 2000 Iu daily D3 otc  Thyroid lab normal  Triglycerides slightly elevated  -rec otc fish 1000 mg 2x per day I.e nature made brand  Blood cts normal  Liver kidneys normal

## 2021-10-12 NOTE — Addendum Note (Signed)
Addended by: Freddy Finner on: 10/12/2021 01:45 PM   Modules accepted: Orders

## 2021-10-12 NOTE — Progress Notes (Signed)

## 2021-10-13 ENCOUNTER — Other Ambulatory Visit: Payer: Self-pay

## 2021-10-13 ENCOUNTER — Ambulatory Visit (INDEPENDENT_AMBULATORY_CARE_PROVIDER_SITE_OTHER): Payer: 59 | Admitting: Internal Medicine

## 2021-10-13 ENCOUNTER — Encounter: Payer: Self-pay | Admitting: Internal Medicine

## 2021-10-13 VITALS — BP 118/74 | HR 75 | Temp 97.6°F | Ht 62.48 in | Wt 198.8 lb

## 2021-10-13 DIAGNOSIS — E782 Mixed hyperlipidemia: Secondary | ICD-10-CM | POA: Diagnosis not present

## 2021-10-13 DIAGNOSIS — R319 Hematuria, unspecified: Secondary | ICD-10-CM

## 2021-10-13 DIAGNOSIS — E039 Hypothyroidism, unspecified: Secondary | ICD-10-CM | POA: Diagnosis not present

## 2021-10-13 DIAGNOSIS — E559 Vitamin D deficiency, unspecified: Secondary | ICD-10-CM

## 2021-10-13 DIAGNOSIS — R739 Hyperglycemia, unspecified: Secondary | ICD-10-CM

## 2021-10-13 DIAGNOSIS — Z0000 Encounter for general adult medical examination without abnormal findings: Secondary | ICD-10-CM

## 2021-10-13 MED ORDER — LEVOTHYROXINE SODIUM 100 MCG PO TABS
ORAL_TABLET | Freq: Every day | ORAL | 3 refills | Status: DC
  Filled 2021-10-13: qty 90, fill #0
  Filled 2021-12-29: qty 90, 90d supply, fill #0
  Filled 2022-04-05: qty 90, 90d supply, fill #1
  Filled 2022-07-06: qty 90, 90d supply, fill #2

## 2021-10-13 NOTE — Patient Instructions (Addendum)
Multivitamin  Elderberry  Oil of oregano  cepacol or chloroseptic spray  Warm tea with honey and lemon  Hydration  Try to eat though you dont feel like it   Tylenol or Advil  Nasal saline /Flonase  Rest   Therapy and psychiatry  [facility] [place]  [address]  [place] [address] [phone]    [facility] [place]  [address] [place] [address]  [phone]

## 2021-10-13 NOTE — Progress Notes (Signed)
Chief Complaint  Patient presents with   Annual Exam   Annual  1. Hypothyroidism on levo 100 mcg qd tsh at goal  2. Hld tgs 180 rec fish oil 1000 mg bid and vit D low rec D3 4000 to 5000 IU daily otc    Review of Systems  Constitutional:  Negative for weight loss.  HENT:  Negative for hearing loss.   Eyes:  Negative for blurred vision.  Respiratory:  Negative for shortness of breath.   Cardiovascular:  Negative for chest pain.  Gastrointestinal:  Negative for abdominal pain and blood in stool.  Genitourinary:  Negative for dysuria.  Musculoskeletal:  Negative for falls and joint pain.  Skin:  Negative for rash.  Neurological:  Negative for headaches.  Psychiatric/Behavioral:  Negative for depression.   Past Medical History:  Diagnosis Date   Anemia    with first pregnancy   Chicken pox    Complication of anesthesia    nausea and vomiting   COVID-19    11/15/20   GERD (gastroesophageal reflux disease)    Preeclampsia    with G1   Unspecified hypothyroidism 07/10/2014   Past Surgical History:  Procedure Laterality Date   CESAREAN SECTION  2014   WISDOM TOOTH EXTRACTION     Family History  Problem Relation Age of Onset   Healthy Mother    Kidney cancer Maternal Grandmother    Heart disease Maternal Grandmother        CHF   Diabetes Maternal Grandfather    Hypertension Father    Social History   Socioeconomic History   Marital status: Married    Spouse name: Not on file   Number of children: Not on file   Years of education: Not on file   Highest education level: Not on file  Occupational History   Not on file  Tobacco Use   Smoking status: Never   Smokeless tobacco: Never  Vaping Use   Vaping Use: Never used  Substance and Sexual Activity   Alcohol use: No   Drug use: No   Sexual activity: Not Currently    Birth control/protection: None    Comment: planning merina  Other Topics Concern   Not on file  Social History Narrative   Married    First  menstrual cycle: 14 yrs   1 son 28 y.o    1 daughter   Lab [facility]       Social Determinants of Health   Financial Resource Strain: Not on file  Food Insecurity: Not on file  Transportation Needs: Not on file  Physical Activity: Not on file  Stress: Not on file  Social Connections: Not on file  Intimate Partner Violence: Not on file   Current Meds  Medication Sig   Cholecalciferol (VITAMIN D-3) 125 MCG (5000 UT) TABS Use as directed 1 tablet in the mouth or throat daily.   doxycycline (VIBRA-TABS) 100 MG tablet Take 1 tablet (100 mg total) by mouth 2 (two) times daily for 10 days.   levonorgestrel (MIRENA) 20 MCG/24HR IUD 1 each by Intrauterine route once.   meloxicam (MOBIC) 15 MG tablet Take 1 tablet (15 mg total) by mouth daily.   [DISCONTINUED] levothyroxine (SYNTHROID) 100 MCG tablet TAKE 1 TABLET BY MOUTH DAILY BEFORE BREAKFAST   Allergies  Allergen Reactions   Adipex-P [Phentermine]     Jittery   Contrave [Naltrexone-Bupropion Hcl Er]     H/a   Recent Results (from the past 2160 hour(s))  Vitamin D (25  hydroxy)     Status: Abnormal   Collection Time: 10/11/21  7:10 AM  Result Value Ref Range   Vit D, 25-Hydroxy 28.80 (L) 30 - 100 ng/mL    Comment: (NOTE) Vitamin D deficiency has been defined by the Institute of Medicine  and an Endocrine Society practice guideline as a level of serum 25-OH  vitamin D less than 20 ng/mL (1,2). The Endocrine Society went on to  further define vitamin D insufficiency as a level between 21 and 29  ng/mL (2).  1. IOM (Institute of Medicine). 2010. Dietary reference intakes for  calcium and D. [place]: [employer]. 2. Holick MF, Binkley [place], Bischoff-Ferrari HA, et al. Evaluation,  treatment, and prevention of vitamin D deficiency: an Endocrine  Society clinical practice guideline, JCEM. 2011 Jul; 96(7): 1911-30.  Performed at [facility], [address], [place], [place] [address]   Vitamin B12      Status: None   Collection Time: 10/11/21  7:10 AM  Result Value Ref Range   Vitamin B-12 538 180 - 914 pg/mL    Comment: (NOTE) This assay is not validated for testing neonatal or myeloproliferative syndrome specimens for Vitamin B12 levels. Performed at [facility], [address] [place], [place] [address]   TSH     Status: None   Collection Time: 10/11/21  7:10 AM  Result Value Ref Range   TSH 2.963 0.350 - 4.500 uIU/mL    Comment: Performed by a 3rd Generation assay with a functional sensitivity of <=0.01 uIU/mL. Performed at [facility], [address] [place], [place] [address]   CBC with Differential/Platelet     Status: Abnormal   Collection Time: 10/11/21  7:10 AM  Result Value Ref Range   WBC 6.0 4.0 - 10.5 K/uL   RBC 4.23 3.87 - 5.11 MIL/uL   Hemoglobin 14.2 12.0 - 15.0 g/dL   HCT 05.6 97.9 - 48.0 %   MCV 91.0 80.0 - 100.0 fL   MCH 33.6 26.0 - 34.0 pg   MCHC 36.9 (H) 30.0 - 36.0 g/dL   RDW 16.5 53.7 - 48.2 %   Platelets 179 150 - 400 K/uL   nRBC 0.0 0.0 - 0.2 %   Neutrophils Relative % 63 %   Neutro Abs 3.7 1.7 - 7.7 K/uL   Lymphocytes Relative 30 %   Lymphs Abs 1.8 0.7 - 4.0 K/uL   Monocytes Relative 6 %   Monocytes Absolute 0.4 0.1 - 1.0 K/uL   Eosinophils Relative 1 %   Eosinophils Absolute 0.0 0.0 - 0.5 K/uL   Basophils Relative 0 %   Basophils Absolute 0.0 0.0 - 0.1 K/uL   Immature Granulocytes 0 %   Abs Immature Granulocytes 0.01 0.00 - 0.07 K/uL    Comment: Performed at [facility], [address] [place], [place] [address]  Lipid panel     Status: Abnormal   Collection Time: 10/11/21  7:10 AM  Result Value Ref Range   Cholesterol 176 0 - 200 mg/dL   Triglycerides 754 (H) <150 mg/dL   HDL 36 (L) >49 mg/dL   Total CHOL/HDL Ratio 4.9 RATIO   VLDL 36 0 - 40 mg/dL   LDL Cholesterol 201 (H) 0 - 99 mg/dL    Comment:        Total Cholesterol/HDL:CHD Risk Coronary Heart Disease Risk Table  Men    Women  1/2 Average Risk   3.4   3.3  Average Risk       5.0   4.4  2 X Average Risk   9.6   7.1  3 X Average Risk  23.4   11.0        Use the calculated Patient Ratio above and the CHD Risk Table to determine the patient's CHD Risk.        ATP III CLASSIFICATION (LDL):  <100     mg/dL   Optimal  295-284  mg/dL   Near or Above                    Optimal  130-159  mg/dL   Borderline  132-440  mg/dL   High  >102     mg/dL   Very High Performed at [facility], [address] [place], [place] [address]   Comprehensive metabolic panel     Status: Abnormal   Collection Time: 10/11/21  7:10 AM  Result Value Ref Range   Sodium 135 135 - 145 mmol/L   Potassium 3.9 3.5 - 5.1 mmol/L   Chloride 106 98 - 111 mmol/L   CO2 23 22 - 32 mmol/L   Glucose, Bld 100 (H) 70 - 99 mg/dL    Comment: Glucose reference range applies only to samples taken after fasting for at least 8 hours.   BUN 14 6 - 20 mg/dL   Creatinine, Ser 6.44 0.44 - 1.00 mg/dL   Calcium 9.3 8.9 - 03.4 mg/dL   Total Protein 7.6 6.5 - 8.1 g/dL   Albumin 4.5 3.5 - 5.0 g/dL   AST 30 15 - 41 U/L   ALT 34 0 - 44 U/L   Alkaline Phosphatase 82 38 - 126 U/L   Total Bilirubin 1.2 0.3 - 1.2 mg/dL   GFR, Estimated >74 >25 mL/min    Comment: (NOTE) Calculated using the CKD-EPI Creatinine Equation (2021)    Anion gap 6 5 - 15    Comment: Performed at [facility], [address] [place], [place] [address]  Urinalysis, Routine w reflex microscopic     Status: Abnormal   Collection Time: 10/11/21  7:30 AM  Result Value Ref Range   Color, Urine YELLOW (A) YELLOW   APPearance HAZY (A) CLEAR   Specific Gravity, Urine 1.023 1.005 - 1.030   pH 5.0 5.0 - 8.0   Glucose, UA NEGATIVE NEGATIVE mg/dL   Hgb urine dipstick SMALL (A) NEGATIVE   Bilirubin Urine NEGATIVE NEGATIVE   Ketones, ur NEGATIVE NEGATIVE mg/dL   Protein, ur NEGATIVE NEGATIVE mg/dL   Nitrite NEGATIVE NEGATIVE   Leukocytes,Ua NEGATIVE NEGATIVE   WBC,  UA 0-5 0 - 5 WBC/hpf   Bacteria, UA RARE (A) NONE SEEN   Squamous Epithelial / LPF 0-5 0 - 5   Mucus PRESENT     Comment: Performed at [facility], [address] [place], [place] [address]   Objective  Body mass index is 35.8 kg/m. Wt Readings from Last 3 Encounters:  10/13/21 198 lb 12.8 oz (90.2 kg)  06/29/21 187 lb (84.8 kg)  02/24/21 191 lb 3.2 oz (86.7 kg)   Temp Readings from Last 3 Encounters:  10/13/21 97.6 F (36.4 C) (Temporal)  03/30/21 98.2 F (36.8 C)  02/24/21 98 F (36.7 C) (Oral)   BP Readings from Last 3 Encounters:  10/13/21 118/74  03/30/21 112/78  02/24/21 118/82   Pulse Readings  from Last 3 Encounters:  10/13/21 75  03/30/21 99  02/24/21 (!) 103    Physical Exam Vitals and nursing note reviewed.  Constitutional:      Appearance: Normal appearance. She is well-developed and well-groomed.  HENT:     Head: Normocephalic and atraumatic.  Eyes:     Conjunctiva/sclera: Conjunctivae normal.     Pupils: Pupils are equal, round, and reactive to light.  Cardiovascular:     Rate and Rhythm: Normal rate and regular rhythm.     Heart sounds: Normal heart sounds. No murmur heard. Pulmonary:     Effort: Pulmonary effort is normal.     Breath sounds: Normal breath sounds.  Abdominal:     General: Abdomen is flat. Bowel sounds are normal.     Tenderness: There is no abdominal tenderness.  Musculoskeletal:        General: No tenderness.  Skin:    General: Skin is warm and dry.  Neurological:     General: No focal deficit present.     Mental Status: She is alert and oriented to person, place, and time. Mental status is at baseline.     Cranial Nerves: Cranial nerves 2-12 are intact.     Gait: Gait is intact.  Psychiatric:        Attention and Perception: Attention and perception normal.        Mood and Affect: Mood and affect normal.        Speech: Speech normal.        Behavior: Behavior normal. Behavior is cooperative.        Thought  Content: Thought content normal.        Cognition and Memory: Cognition and memory normal.        Judgment: Judgment normal.    Assessment  Plan  Annual physical exam See HM   Vitamin D deficiency D3 4000 to 5000 iu daily otc   Mixed hyperlipidemia Rec fish oil 1000 mcg qd   Hypothyroidism, unspecified type - Plan: levothyroxine (SYNTHROID) 100 MCG tablet   See above Flu shot utd  Tdap utd Pfizer 2/2 ? Doesn't know if will get fo rnow   Pap had 10/2018 with out 10/2018 pregnant 09/12/2019 then IUD intact  12/05/19 pap negative Dr. Tiburcio Pea   mammo age 69   Colonoscopy age 71   Declines hep C rec healthy diet and exercise    Provider: Dr. French Ana McLean-Scocuzza-Internal Medicine

## 2021-10-27 ENCOUNTER — Other Ambulatory Visit: Payer: Self-pay

## 2021-11-04 ENCOUNTER — Other Ambulatory Visit: Payer: Self-pay

## 2021-11-04 ENCOUNTER — Encounter: Payer: Self-pay | Admitting: Nurse Practitioner

## 2021-11-04 ENCOUNTER — Ambulatory Visit: Payer: 59 | Admitting: Nurse Practitioner

## 2021-11-04 ENCOUNTER — Other Ambulatory Visit
Admission: RE | Admit: 2021-11-04 | Discharge: 2021-11-04 | Disposition: A | Discharge status: Home / Self Care | Payer: 59 | Source: Ambulatory Visit | Attending: Nurse Practitioner | Admitting: Nurse Practitioner

## 2021-11-04 VITALS — BP 118/74 | HR 119 | Temp 98.7°F | Ht 62.48 in | Wt 196.0 lb

## 2021-11-04 DIAGNOSIS — Z20822 Contact with and (suspected) exposure to covid-19: Secondary | ICD-10-CM | POA: Diagnosis not present

## 2021-11-04 DIAGNOSIS — J069 Acute upper respiratory infection, unspecified: Secondary | ICD-10-CM

## 2021-11-04 LAB — RESP PANEL BY RT-PCR (RSV, FLU A&B, COVID)  RVPGX2
Influenza A by PCR: NEGATIVE
Influenza B by PCR: NEGATIVE
Resp Syncytial Virus by PCR: POSITIVE — AB
SARS Coronavirus 2 by RT PCR: NEGATIVE

## 2021-11-04 MED ORDER — GUAIFENESIN-CODEINE 100-10 MG/5ML PO SOLN
5.0000 mL | Freq: Two times a day (BID) | ORAL | 0 refills | Status: DC | PRN
  Filled 2021-11-04: qty 118, 12d supply, fill #0

## 2021-11-04 MED ORDER — ALBUTEROL SULFATE HFA 108 (90 BASE) MCG/ACT IN AERS
2.0000 | INHALATION_SPRAY | Freq: Four times a day (QID) | RESPIRATORY_TRACT | 0 refills | Status: DC | PRN
  Filled 2021-11-04: qty 18, 30d supply, fill #0

## 2021-11-04 MED ORDER — AZITHROMYCIN 250 MG PO TABS
ORAL_TABLET | ORAL | 0 refills | Status: AC
Start: 2021-11-04 — End: 2021-11-09
  Filled 2021-11-04: qty 6, 5d supply, fill #0

## 2021-11-04 NOTE — Progress Notes (Signed)
Subjective:  Patient ID: Olivia Henderson, female    DOB: 29-Apr-1991  Age: 31 y.o. MRN: [id number]  CC:  Chief Complaint  Patient presents with   Nasal Congestion    Chest congestion, cough, stuffy, throat feels tight.      HPI  This patient arrives today for the above.  Symptoms started yesterday.  Was exposed to positive RSV patient (her daughter at home).  Overall feels symptoms are mild, however her cough is bothersome and she does feel short of breath at times.  She is been taking Mucinex as well as sinus max over-the-counter at home and tells me this does help her symptoms.  She is wondering if she should avoid going to work for the next couple of days.  She tells me she has taken Tessalon Perles in the past without improvement in symptoms.  Past Medical History:  Diagnosis Date   Anemia    with first pregnancy   Chicken pox    Complication of anesthesia    nausea and vomiting   COVID-19    11/15/20   GERD (gastroesophageal reflux disease)    Preeclampsia    with G1   Unspecified hypothyroidism 07/10/2014      Family History  Problem Relation Age of Onset   Healthy Mother    Kidney cancer Maternal Grandmother    Heart disease Maternal Grandmother        CHF   Diabetes Maternal Grandfather    Hypertension Father     Social History   Social History Narrative   Married    First menstrual cycle: 51 yrs   1 son 50 y.o    1 daughter   Lab [facility]       Social History   Tobacco Use   Smoking status: Never   Smokeless tobacco: Never  Substance Use Topics   Alcohol use: No     Current Meds  Medication Sig   albuterol (VENTOLIN HFA) 108 (90 Base) MCG/ACT inhaler Inhale 2 puffs into the lungs every 6 (six) hours as needed for wheezing or shortness of breath.   azithromycin (ZITHROMAX) 250 MG tablet Take 2 tablets on day 1, then 1 tablet daily on days 2 through 5   Cholecalciferol (VITAMIN D-3) 125 MCG (5000 UT) TABS Use as directed 1 tablet in the  mouth or throat daily.   guaiFENesin-codeine 100-10 MG/5ML syrup Take 5 mLs by mouth 2 (two) times daily as needed for cough.   levonorgestrel (MIRENA) 20 MCG/24HR IUD 1 each by Intrauterine route once.   levothyroxine (SYNTHROID) 100 MCG tablet TAKE 1 TABLET BY MOUTH DAILY BEFORE BREAKFAST   meloxicam (MOBIC) 15 MG tablet Take 1 tablet (15 mg total) by mouth daily.    ROS:  Review of Systems  Constitutional:  Negative for fever.  HENT:  Positive for congestion. Negative for sore throat.   Respiratory:  Positive for cough, sputum production (yellowish, white), shortness of breath and wheezing.   Cardiovascular:  Negative for chest pain.  Neurological:  Negative for headaches.    Objective:   Today's Vitals: BP 118/74 (BP Location: Left Arm, Patient Position: Sitting, Cuff Size: Normal)    Pulse (!) 119    Temp 98.7 F (37.1 C) (Oral)    Ht 5' 2.48" (1.587 m)    Wt 196 lb (88.9 kg)    LMP  (LMP Unknown)    SpO2 98%    BMI 35.30 kg/m  Vitals with BMI 11/04/2021 10/13/2021 06/29/2021  Height 5' 2.48" 5' 2.48" 5' 2.008"  Weight 196 lbs 198 lbs 13 oz 187 lbs  BMI 35.3 0000000 99991111  Systolic 123456 123456 -  Diastolic 74 74 -  Pulse 123456 75 -     Physical Exam Vitals reviewed.  Constitutional:      General: She is not in acute distress.    Appearance: Normal appearance.  HENT:     Head: Normocephalic and atraumatic.  Neck:     Vascular: No carotid bruit.  Cardiovascular:     Rate and Rhythm: Regular rhythm. Tachycardia present.     Pulses: Normal pulses.     Heart sounds: Normal heart sounds.  Pulmonary:     Effort: Pulmonary effort is normal.     Breath sounds: Normal breath sounds. No stridor or decreased air movement. No wheezing or rhonchi.  Lymphadenopathy:     Cervical: No cervical adenopathy.  Skin:    General: Skin is warm and dry.  Neurological:     General: No focal deficit present.     Mental Status: She is alert and oriented to person, place, and time.  Psychiatric:         Mood and Affect: Mood normal.        Behavior: Behavior normal.        Judgment: Judgment normal.         Assessment and Plan   1. Upper respiratory tract infection, unspecified type      Plan: 1.  Most likely RSV or other viral upper respiratory infection.  Will treat symptomatically for now.  She was told if symptoms get increasingly worse over the next few days especially if she experiences high fever, worsening shortness of breath, worsening cough, worsening fatigue she should notify us and be reevaluated in person.  She tells me she understands.  Recommend she continue using her Mucinex as needed as well as her sinus medication.  We will prescribe guaifenesin with codeine as needed for cough suppressant, she was told to take either dose of Mucinex or a dose of her cough suppressant not to combine them.  She also she understands.  We will also prescribe albuterol inhaler that she can take as needed.  We will provide azithromycin if symptoms persist for 10 days.  She was educated to return to office if symptoms get worse between now and next week or if they persist past the next 2 weeks.  Tests ordered Orders Placed This Encounter  Procedures   Respiratory virus panel      Meds ordered this encounter  Medications   azithromycin (ZITHROMAX) 250 MG tablet    Sig: Take 2 tablets on day 1, then 1 tablet daily on days 2 through 5    Dispense:  6 tablet    Refill:  0    Order Specific Question:   Supervising Provider    Answer:   BURNS, Claudina Lick [id number]   albuterol (VENTOLIN HFA) 108 (90 Base) MCG/ACT inhaler    Sig: Inhale 2 puffs into the lungs every 6 (six) hours as needed for wheezing or shortness of breath.    Dispense:  8 g    Refill:  0    Order Specific Question:   Supervising Provider    Answer:   BURNS, Claudina Lick [id number]   guaiFENesin-codeine 100-10 MG/5ML syrup    Sig: Take 5 mLs by mouth 2 (two) times daily as needed for cough.    Dispense:  120 mL     Refill:  0    Order Specific Question:   Supervising Provider    Answer:   Binnie Rail [id number]    Patient to follow-up with primary care provider as scheduled or sooner as needed.  Ailene Ards, NP

## 2021-12-29 ENCOUNTER — Other Ambulatory Visit: Payer: Self-pay

## 2022-01-03 ENCOUNTER — Telehealth: Payer: 59 | Admitting: Physician Assistant

## 2022-01-03 ENCOUNTER — Other Ambulatory Visit: Payer: Self-pay

## 2022-01-03 DIAGNOSIS — U071 COVID-19: Secondary | ICD-10-CM

## 2022-01-03 MED ORDER — ONDANSETRON 4 MG PO TBDP
4.0000 mg | ORAL_TABLET | Freq: Three times a day (TID) | ORAL | 0 refills | Status: DC | PRN
  Filled 2022-01-03: qty 20, 7d supply, fill #0

## 2022-01-03 MED ORDER — MOLNUPIRAVIR EUA 200MG CAPSULE
4.0000 | ORAL_CAPSULE | Freq: Two times a day (BID) | ORAL | 0 refills | Status: AC
  Filled 2022-01-03: qty 40, 5d supply, fill #0

## 2022-01-03 NOTE — Patient Instructions (Signed)
Sherril Rogelia Boga, thank you for joining Mar Daring, PA-C for today's virtual visit.  While this provider is not your primary care provider (PCP), if your PCP is located in our provider database this encounter information will be shared with them immediately following your visit.  Consent: (Patient) Olivia Henderson provided verbal consent for this virtual visit at the beginning of the encounter.  Current Medications:  Current Outpatient Medications:    molnupiravir EUA (LAGEVRIO) 200 mg CAPS capsule, Take 4 capsules (800 mg total) by mouth 2 (two) times daily for 5 days., Disp: 40 capsule, Rfl: 0   ondansetron (ZOFRAN-ODT) 4 MG disintegrating tablet, Take 1 tablet (4 mg total) by mouth every 8 (eight) hours as needed for nausea or vomiting., Disp: 20 tablet, Rfl: 0   albuterol (VENTOLIN HFA) 108 (90 Base) MCG/ACT inhaler, Inhale 2 puffs into the lungs every 6 (six) hours as needed for wheezing or shortness of breath., Disp: 18 g, Rfl: 0   Cholecalciferol (VITAMIN D-3) 125 MCG (5000 UT) TABS, Use as directed 1 tablet in the mouth or throat daily., Disp: 30 tablet, Rfl: 0   guaiFENesin-codeine 100-10 MG/5ML syrup, Take 5 mLs by mouth 2 (two) times daily as needed for cough., Disp: 118 mL, Rfl: 0   levonorgestrel (MIRENA) 20 MCG/24HR IUD, 1 each by Intrauterine route once., Disp: , Rfl:    levothyroxine (SYNTHROID) 100 MCG tablet, TAKE 1 TABLET BY MOUTH DAILY BEFORE BREAKFAST, Disp: 90 tablet, Rfl: 3   meloxicam (MOBIC) 15 MG tablet, Take 1 tablet (15 mg total) by mouth daily., Disp: 30 tablet, Rfl: 3   Medications ordered in this encounter:  Meds ordered this encounter  Medications   ondansetron (ZOFRAN-ODT) 4 MG disintegrating tablet    Sig: Take 1 tablet (4 mg total) by mouth every 8 (eight) hours as needed for nausea or vomiting.    Dispense:  20 tablet    Refill:  0    Order Specific Question:   Supervising Provider    Answer:   MILLER, BRIAN [[id number]]   molnupiravir  EUA (LAGEVRIO) 200 mg CAPS capsule    Sig: Take 4 capsules (800 mg total) by mouth 2 (two) times daily for 5 days.    Dispense:  40 capsule    Refill:  0    Order Specific Question:   Supervising Provider    Answer:   Sabra Heck, [place]     *If you need refills on other medications prior to your next appointment, please contact your pharmacy*  Follow-Up: Call back or seek an in-person evaluation if the symptoms worsen or if the condition fails to improve as anticipated.  Other Instructions COVID-19: Quarantine and Isolation Quarantine If you were exposed Quarantine and stay away from others when you have been in close contact with someone who has COVID-19. Isolate If you are sick or test positive Isolate when you are sick or when you have COVID-19, even if you don't have symptoms. When to stay home Calculating quarantine The date of your exposure is considered day 0. Day 1 is the first full day after your last contact with a person who has had COVID-19. Stay home and away from other people for at least 5 days. Learn why CDC updated guidance for the general public. IF YOU were exposed to COVID-19 and are NOT  up to dateIF YOU were exposed to COVID-19 and are NOT on COVID-19 vaccinations Quarantine for at least 5 days Stay home Stay home and quarantine for at  least 5 full days. Wear a well-fitting mask if you must be around others in your home. Do not travel. Get tested Even if you don't develop symptoms, get tested at least 5 days after you last had close contact with someone with COVID-19. After quarantine Watch for symptoms Watch for symptoms until 10 days after you last had close contact with someone with COVID-19. Avoid travel It is best to avoid travel until a full 10 days after you last had close contact with someone with COVID-19. If you develop symptoms Isolate immediately and get tested. Continue to stay home until you know the results. Wear a well-fitting mask around  others. Take precautions until day 10 Wear a well-fitting mask Wear a well-fitting mask for 10 full days any time you are around others inside your home or in public. Do not go to places where you are unable to wear a well-fitting mask. If you must travel during days 6-10, take precautions. Avoid being around people who are more likely to get very sick from COVID-19. IF YOU were exposed to COVID-19 and are  up to dateIF YOU were exposed to COVID-19 and are on COVID-19 vaccinations No quarantine You do not need to stay home unless you develop symptoms. Get tested Even if you don't develop symptoms, get tested at least 5 days after you last had close contact with someone with COVID-19. Watch for symptoms Watch for symptoms until 10 days after you last had close contact with someone with COVID-19. If you develop symptoms Isolate immediately and get tested. Continue to stay home until you know the results. Wear a well-fitting mask around others. Take precautions until day 10 Wear a well-fitting mask Wear a well-fitting mask for 10 full days any time you are around others inside your home or in public. Do not go to places where you are unable to wear a well-fitting mask. Take precautions if traveling Avoid being around people who are more likely to get very sick from COVID-19. IF YOU were exposed to COVID-19 and had confirmed COVID-19 within the past 90 days (you tested positive using a viral test) No quarantine You do not need to stay home unless you develop symptoms. Watch for symptoms Watch for symptoms until 10 days after you last had close contact with someone with COVID-19. If you develop symptoms Isolate immediately and get tested. Continue to stay home until you know the results. Wear a well-fitting mask around others. Take precautions until day 10 Wear a well-fitting mask Wear a well-fitting mask for 10 full days any time you are around others inside your home or in public. Do not  go to places where you are unable to wear a well-fitting mask. Take precautions if traveling Avoid being around people who are more likely to get very sick from COVID-19. Calculating isolation Day 0 is your first day of symptoms or a positive viral test. Day 1 is the first full day after your symptoms developed or your test specimen was collected. If you have COVID-19 or have symptoms, isolate for at least 5 days. IF YOU tested positive for COVID-19 or have symptoms, regardless of vaccination status Stay home for at least 5 days Stay home for 5 days and isolate from others in your home. Wear a well-fitting mask if you must be around others in your home. Do not travel. Ending isolation if you had symptoms End isolation after 5 full days if you are fever-free for 24 hours (without the use of fever-reducing medication)  and your symptoms are improving. Ending isolation if you did NOT have symptoms End isolation after at least 5 full days after your positive test. If you got very sick from COVID-19 or have a weakened immune system You should isolate for at least 10 days. Consult your doctor before ending isolation. Take precautions until day 10 Wear a well-fitting mask Wear a well-fitting mask for 10 full days any time you are around others inside your home or in public. Do not go to places where you are unable to wear a well-fitting mask. Do not travel Do not travel until a full 10 days after your symptoms started or the date your positive test was taken if you had no symptoms. Avoid being around people who are more likely to get very sick from COVID-19. Definitions Exposure Contact with someone infected with SARS-CoV-2, the virus that causes COVID-19, in a way that increases the likelihood of getting infected with the virus. Close contact A close contact is someone who was less than 6 feet away from an infected person (laboratory-confirmed or a clinical diagnosis) for a cumulative total of 15  minutes or more over a 24-hour period. For example, three individual 5-minute exposures for a total of 15 minutes. People who are exposed to someone with COVID-19 after they completed at least 5 days of isolation are not considered close contacts. Julio Sicks is a strategy used to prevent transmission of COVID-19 by keeping people who have been in close contact with someone with COVID-19 apart from others. Who does not need to quarantine? If you had close contact with someone with COVID-19 and you are in one of the following groups, you do not need to quarantine. You are up to date with your COVID-19 vaccines. You had confirmed COVID-19 within the last 90 days (meaning you tested positive using a viral test). If you are up to date with COVID-19 vaccines, you should wear a well-fitting mask around others for 10 days from the date of your last close contact with someone with COVID-19 (the date of last close contact is considered day 0). Get tested at least 5 days after you last had close contact with someone with COVID-19. If you test positive or develop COVID-19 symptoms, isolate from other people and follow recommendations in the Isolation section below. If you tested positive for COVID-19 with a viral test within the previous 90 days and subsequently recovered and remain without COVID-19 symptoms, you do not need to quarantine or get tested after close contact. You should wear a well-fitting mask around others for 10 days from the date of your last close contact with someone with COVID-19 (the date of last close contact is considered day 0). If you have COVID-19 symptoms, get tested and isolate from other people and follow recommendations in the Isolation section below. Who should quarantine? If you come into close contact with someone with COVID-19, you should quarantine if you are not up to date on COVID-19 vaccines. This includes people who are not vaccinated. What to do for quarantine Stay  home and away from other people for at least 5 days (day 0 through day 5) after your last contact with a person who has COVID-19. The date of your exposure is considered day 0. Wear a well-fitting mask when around others at home, if possible. For 10 days after your last close contact with someone with COVID-19, watch for fever (100.46F or greater), cough, shortness of breath, or other COVID-19 symptoms. If you develop symptoms, get  tested immediately and isolate until you receive your test results. If you test positive, follow isolation recommendations. If you do not develop symptoms, get tested at least 5 days after you last had close contact with someone with COVID-19. If you test negative, you can leave your home, but continue to wear a well-fitting mask when around others at home and in public until 10 days after your last close contact with someone with COVID-19. If you test positive, you should isolate for at least 5 days from the date of your positive test (if you do not have symptoms). If you do develop COVID-19 symptoms, isolate for at least 5 days from the date your symptoms began (the date the symptoms started is day 0). Follow recommendations in the isolation section below. If you are unable to get a test 5 days after last close contact with someone with COVID-19, you can leave your home after day 5 if you have been without COVID-19 symptoms throughout the 5-day period. Wear a well-fitting mask for 10 days after your date of last close contact when around others at home and in public. Avoid people who are have weakened immune systems or are more likely to get very sick from COVID-19, and nursing homes and other high-risk settings, until after at least 10 days. If possible, stay away from people you live with, especially people who are at higher risk for getting very sick from COVID-19, as well as others outside your home throughout the full 10 days after your last close contact with someone with  COVID-19. If you are unable to quarantine, you should wear a well-fitting mask for 10 days when around others at home and in public. If you are unable to wear a mask when around others, you should continue to quarantine for 10 days. Avoid people who have weakened immune systems or are more likely to get very sick from COVID-19, and nursing homes and other high-risk settings, until after at least 10 days. See additional information about travel. Do not go to places where you are unable to wear a mask, such as restaurants and some gyms, and avoid eating around others at home and at work until after 10 days after your last close contact with someone with COVID-19. After quarantine Watch for symptoms until 10 days after your last close contact with someone with COVID-19. If you have symptoms, isolate immediately and get tested. Quarantine in high-risk congregate settings In certain congregate settings that have high risk of secondary transmission (such as Systems analyst and detention facilities, homeless shelters, or cruise ships), CDC recommends a 10-day quarantine for residents, regardless of vaccination and booster status. During periods of critical staffing shortages, facilities may consider shortening the quarantine period for staff to ensure continuity of operations. Decisions to shorten quarantine in these settings should be made in consultation with state, local, tribal, or territorial health departments and should take into consideration the context and characteristics of the facility. CDC's setting-specific guidance provides additional recommendations for these settings. Isolation Isolation is used to separate people with confirmed or suspected COVID-19 from those without COVID-19. People who are in isolation should stay home until it's safe for them to be around others. At home, anyone sick or infected should separate from others, or wear a well-fitting mask when they need to be around others. People  in isolation should stay in a specific "sick room" or area and use a separate bathroom if available. Everyone who has presumed or confirmed COVID-19 should stay home and isolate  from other people for at least 5 full days (day 0 is the first day of symptoms or the date of the day of the positive viral test for asymptomatic persons). They should wear a mask when around others at home and in public for an additional 5 days. People who are confirmed to have COVID-19 or are showing symptoms of COVID-19 need to isolate regardless of their vaccination status. This includes: People who have a positive viral test for COVID-19, regardless of whether or not they have symptoms. People with symptoms of COVID-19, including people who are awaiting test results or have not been tested. People with symptoms should isolate even if they do not know if they have been in close contact with someone with COVID-19. What to do for isolation Monitor your symptoms. If you have an emergency warning sign (including trouble breathing), seek emergency medical care immediately. Stay in a separate room from other household members, if possible. Use a separate bathroom, if possible. Take steps to improve ventilation at home, if possible. Avoid contact with other members of the household and pets. Don't share personal household items, like cups, towels, and utensils. Wear a well-fitting mask when you need to be around other people. Learn more about what to do if you are sick and how to notify your contacts. Ending isolation for people who had COVID-19 and had symptoms If you had COVID-19 and had symptoms, isolate for at least 5 days. To calculate your 5-day isolation period, day 0 is your first day of symptoms. Day 1 is the first full day after your symptoms developed. You can leave isolation after 5 full days. You can end isolation after 5 full days if you are fever-free for 24 hours without the use of fever-reducing medication and  your other symptoms have improved (Loss of taste and smell may persist for weeks or months after recovery and need not delay the end of isolation). You should continue to wear a well-fitting mask around others at home and in public for 5 additional days (day 6 through day 10) after the end of your 5-day isolation period. If you are unable to wear a mask when around others, you should continue to isolate for a full 10 days. Avoid people who have weakened immune systems or are more likely to get very sick from COVID-19, and nursing homes and other high-risk settings, until after at least 10 days. If you continue to have fever or your other symptoms have not improved after 5 days of isolation, you should wait to end your isolation until you are fever-free for 24 hours without the use of fever-reducing medication and your other symptoms have improved. Continue to wear a well-fitting mask through day 10. Contact your healthcare provider if you have questions. See additional information about travel. Do not go to places where you are unable to wear a mask, such as restaurants and some gyms, and avoid eating around others at home and at work until a full 10 days after your first day of symptoms. If an individual has access to a test and wants to test, the best approach is to use an antigen test1 towards the end of the 5-day isolation period. Collect the test sample only if you are fever-free for 24 hours without the use of fever-reducing medication and your other symptoms have improved (loss of taste and smell may persist for weeks or months after recovery and need not delay the end of isolation). If your test result is positive, you  should continue to isolate until day 10. If your test result is negative, you can end isolation, but continue to wear a well-fitting mask around others at home and in public until day 10. Follow additional recommendations for masking and avoiding travel as described above. 1As noted in  the labeling for authorized over-the counter antigen tests: Negative results should be treated as presumptive. Negative results do not rule out SARS-CoV-2 infection and should not be used as the sole basis for treatment or patient management decisions, including infection control decisions. To improve results, antigen tests should be used twice over a three-day period with at least 24 hours and no more than 48 hours between tests. Note that these recommendations on ending isolation do not apply to people who are moderately ill or very sick from COVID-19 or have weakened immune systems. See section below for recommendations for when to end isolation for these groups. Ending isolation for people who tested positive for COVID-19 but had no symptoms If you test positive for COVID-19 and never develop symptoms, isolate for at least 5 days. Day 0 is the day of your positive viral test (based on the date you were tested) and day 1 is the first full day after the specimen was collected for your positive test. You can leave isolation after 5 full days. If you continue to have no symptoms, you can end isolation after at least 5 days. You should continue to wear a well-fitting mask around others at home and in public until day 10 (day 6 through day 10). If you are unable to wear a mask when around others, you should continue to isolate for 10 days. Avoid people who have weakened immune systems or are more likely to get very sick from COVID-19, and nursing homes and other high-risk settings, until after at least 10 days. If you develop symptoms after testing positive, your 5-day isolation period should start over. Day 0 is your first day of symptoms. Follow the recommendations above for ending isolation for people who had COVID-19 and had symptoms. See additional information about travel. Do not go to places where you are unable to wear a mask, such as restaurants and some gyms, and avoid eating around others at home  and at work until 10 days after the day of your positive test. If an individual has access to a test and wants to test, the best approach is to use an antigen test1 towards the end of the 5-day isolation period. If your test result is positive, you should continue to isolate until day 10. If your test result is positive, you can also choose to test daily and if your test result is negative, you can end isolation, but continue to wear a well-fitting mask around others at home and in public until day 10. Follow additional recommendations for masking and avoiding travel as described above. 1As noted in the labeling for authorized over-the counter antigen tests: Negative results should be treated as presumptive. Negative results do not rule out SARS-CoV-2 infection and should not be used as the sole basis for treatment or patient management decisions, including infection control decisions. To improve results, antigen tests should be used twice over a three-day period with at least 24 hours and no more than 48 hours between tests. Ending isolation for people who were moderately or very sick from COVID-19 or have a weakened immune system People who are moderately ill from COVID-19 (experiencing symptoms that affect the lungs like shortness of breath or difficulty  breathing) should isolate for 10 days and follow all other isolation precautions. To calculate your 10-day isolation period, day 0 is your first day of symptoms. Day 1 is the first full day after your symptoms developed. If you are unsure if your symptoms are moderate, talk to a healthcare provider for further guidance. People who are very sick from COVID-19 (this means people who were hospitalized or required intensive care or ventilation support) and people who have weakened immune systems might need to isolate at home longer. They may also require testing with a viral test to determine when they can be around others. CDC recommends an isolation period of  at least 10 and up to 20 days for people who were very sick from COVID-19 and for people with weakened immune systems. Consult with your healthcare provider about when you can resume being around other people. If you are unsure if your symptoms are severe or if you have a weakened immune system, talk to a healthcare provider for further guidance. People who have a weakened immune system should talk to their healthcare provider about the potential for reduced immune responses to COVID-19 vaccines and the need to continue to follow current prevention measures (including wearing a well-fitting mask and avoiding crowds and poorly ventilated indoor spaces) to protect themselves against COVID-19 until advised otherwise by their healthcare provider. Close contacts of immunocompromised people--including household members--should also be encouraged to receive all recommended COVID-19 vaccine doses to help protect these people. Isolation in high-risk congregate settings In certain high-risk congregate settings that have high risk of secondary transmission and where it is not feasible to cohort people (such as Systems analyst and detention facilities, homeless shelters, and cruise ships), CDC recommends a 10-day isolation period for residents. During periods of critical staffing shortages, facilities may consider shortening the isolation period for staff to ensure continuity of operations. Decisions to shorten isolation in these settings should be made in consultation with state, local, tribal, or territorial health departments and should take into consideration the context and characteristics of the facility. CDC's setting-specific guidance provides additional recommendations for these settings. This CDC guidance is meant to supplement--not replace--any federal, state, local, territorial, or tribal health and safety laws, rules, and regulations. Recommendations for specific settings These recommendations do not apply to  healthcare professionals. For guidance specific to these settings, see Healthcare professionals: Interim Guidance for Optician, dispensing with SARS-CoV-2 Infection or Exposure to SARS-CoV-2 Patients, residents, and visitors to healthcare settings: Interim Infection Prevention and Control Recommendations for Healthcare Personnel During the [place] 2019 (COVID-19) Pandemic Additional setting-specific guidance and recommendations are available. These recommendations on quarantine and isolation do apply to [place] settings. Additional guidance is available here: Overview of COVID-19 Quarantine for K-12 Schools Travelers: Travel information and recommendations Congregate facilities and other settings: [employer] for community, work, and school settings Ongoing COVID-19 exposure FAQs I live with someone with COVID-19, but I cannot be separated from them. How do we manage quarantine in this situation? It is very important for people with COVID-19 to remain apart from other people, if possible, even if they are living together. If separation of the person with COVID-19 from others that they live with is not possible, the other people that they live with will have ongoing exposure, meaning they will be repeatedly exposed until that person is no longer able to spread the virus to other people. In this situation, there are precautions you can take to limit the spread of COVID-19: The person with  COVID-19 and everyone they live with should wear a well-fitting mask inside the home. If possible, one person should care for the person with COVID-19 to limit the number of people who are in close contact with the infected person. Take steps to protect yourself and others to reduce transmission in the home: Quarantine if you are not up to date with your COVID-19 vaccines. Isolate if you are sick or tested positive for COVID-19, even if you don't have symptoms. Learn more about the public  health recommendations for testing, mask use and quarantine of close contacts, like yourself, who have ongoing exposure. These recommendations differ depending on your vaccination status. What should I do if I have ongoing exposure to COVID-19 from someone I live with? Recommendations for this situation depend on your vaccination status: If you are not up to date on COVID-19 vaccines and have ongoing exposure to COVID-19, you should: Begin quarantine immediately and continue to quarantine throughout the isolation period of the person with COVID-19. Continue to quarantine for an additional 5 days starting the day after the end of isolation for the person with COVID-19. Get tested at least 5 days after the end of isolation of the infected person that lives with them. If you test negative, you can leave the home but should continue to wear a well-fitting mask when around others at home and in public until 10 days after the end of isolation for the person with COVID-19. Isolate immediately if you develop symptoms of COVID-19 or test positive. If you are up to date with COVID-19 vaccines and have ongoing exposure to COVID-19, you should: Get tested at least 5 days after your first exposure. A person with COVID-19 is considered infectious starting 2 days before they develop symptoms, or 2 days before the date of their positive test if they do not have symptoms. Get tested again at least 5 days after the end of isolation for the person with COVID-19. Wear a well-fitting mask when you are around the person with COVID-19, and do this throughout their isolation period. Wear a well-fitting mask around others for 10 days after the infected person's isolation period ends. Isolate immediately if you develop symptoms of COVID-19 or test positive. What should I do if multiple people I live with test positive for COVID-19 at different times? Recommendations for this situation depend on your vaccination status: If  you are not up to date with your COVID-19 vaccines, you should: Quarantine throughout the isolation period of any infected person that you live with. Continue to quarantine until 5 days after the end of isolation date for the most recently infected person that lives with you. For example, if the last day of isolation of the person most recently infected with COVID-19 was June 30, the new 5-day quarantine period starts on July 1. Get tested at least 5 days after the end of isolation for the most recently infected person that lives with you. Wear a well-fitting mask when you are around any person with COVID-19 while that person is in isolation. Wear a well-fitting mask when you are around other people until 10 days after your last close contact. Isolate immediately if you develop symptoms of COVID-19 or test positive. If you are up to date with your COVID-19 vaccines, you should: Get tested at least 5 days after your first exposure. A person with COVID-19 is considered infectious starting 2 days before they developed symptoms, or 2 days before the date of their positive test if they do  not have symptoms. Get tested again at least 5 days after the end of isolation for the most recently infected person that lives with you. Wear a well-fitting mask when you are around any person with COVID-19 while that person is in isolation. Wear a well-fitting mask around others for 10 days after the end of isolation for the most recently infected person that lives with you. For example, if the last day of isolation for the person most recently infected with COVID-19 was June 30, the new 10-day period to wear a well-fitting mask indoors in public starts on July 1. Isolate immediately if you develop symptoms of COVID-19 or test positive. I had COVID-19 and completed isolation. Do I have to quarantine or get tested if someone I live with gets COVID-19 shortly after I completed isolation? No. If you recently completed  isolation and someone that lives with you tests positive for the virus that causes COVID-19 shortly after the end of your isolation period, you do not have to quarantine or get tested as long as you do not develop new symptoms. Once all of the people that live together have completed isolation or quarantine, refer to the guidance below for new exposures to COVID-19. If you had COVID-19 in the previous 90 days and then came into close contact with someone with COVID-19, you do not have to quarantine or get tested if you do not have symptoms. But you should: Wear a well-fitting mask indoors in public for 10 days after your last close contact. Monitor for COVID-19 symptoms for 10 days from the date of your last close contact. Isolate immediately and get tested if symptoms develop. If more than 90 days have passed since your recovery from infection, follow CDC's recommendations for close contacts. These recommendations will differ depending on your vaccination status. 01/27/2021 Content source: [facility]), Division of Viral Diseases This information is not intended to replace advice given to you by your health care provider. Make sure you discuss any questions you have with your health care provider. Document Revised: 06/02/2021 Document Reviewed: 06/02/2021 Elsevier Patient Education  2022 Elsevier Inc.   Molnupiravir Oral Capsules What is this medication? MOLNUPIRAVIR (mol nue pir a vir) treats COVID-19. It is an antiviral medication. It may decrease the risk of developing severe symptoms of COVID-19. It may also decrease the chance of going to the hospital. This medication is not approved by the FDA. The FDA has authorized emergency use of this medication during the COVID-19 pandemic. This medicine may be used for other purposes; ask your health care provider or pharmacist if you have questions. COMMON BRAND NAME(S): LAGEVRIO What should I tell  my care team before I take this medication? They need to know if you have any of these conditions: Any allergies Any serious illness An unusual or allergic reaction to molnupiravir, other medications, foods, dyes, or preservatives Pregnant or trying to get pregnant Breast-feeding How should I use this medication? Take this medication by mouth with water. Take it as directed on the prescription label at the same time every day. Do not cut, crush or chew this medication. Swallow the capsules whole. You can take it with or without food. If it upsets your stomach, take it with food. Take all of this medication unless your care team tells you to stop it early. Keep taking it even if you think you are better. Talk to your care team about the use of this medication in children. Special  care may be needed. Overdosage: If you think you have taken too much of this medicine contact a poison control center or emergency room at once. NOTE: This medicine is only for you. Do not share this medicine with others. What if I miss a dose? If you miss a dose, take it as soon as you can unless it is more than 10 hours late. If it is more than 10 hours late, skip the missed dose. Take the next dose at the normal time. Do not take extra or 2 doses at the same time to make up for the missed dose. What may interact with this medication? Interactions have not been studied. This list may not describe all possible interactions. Give your health care provider a list of all the medicines, herbs, non-prescription drugs, or dietary supplements you use. Also tell them if you smoke, drink alcohol, or use illegal drugs. Some items may interact with your medicine. What should I watch for while using this medication? Your condition will be monitored carefully while you are receiving this medication. Visit your care team for regular checkups. Tell your care team if your symptoms do not start to get better or if they get worse. Do not  become pregnant while taking this medication. You may need a pregnancy test before starting this medication. Women must use a reliable form of birth control while taking this medication and for 4 days after stopping the medication. Women should inform their care team if they wish to become pregnant or think they might be pregnant. Men should not father a child while taking this medication and for 3 months after stopping it. There is potential for serious harm to an unborn child. Talk to your care team for more information. Do not breast-feed an infant while taking this medication and for 4 days after stopping the medication. What side effects may I notice from receiving this medication? Side effects that you should report to your care team as soon as possible: Allergic reactions--skin rash, itching, hives, swelling of the face, lips, tongue, or throat Side effects that usually do not require medical attention (report these to your care team if they continue or are bothersome): Diarrhea Dizziness Nausea This list may not describe all possible side effects. Call your doctor for medical advice about side effects. You may report side effects to FDA at 1-800-FDA-1088. Where should I keep my medication? Keep out of the reach of children and pets. Store at room temperature between 20 and 25 degrees C (68 and 77 degrees F). Get rid of any unused medication after the expiration date. To get rid of medications that are no longer needed or have expired: Take the medication to a medication take-back program. Check with your pharmacy or law enforcement to find a location. If you cannot return the medication, check the label or package insert to see if the medication should be thrown out in the garbage or flushed down the toilet. If you are not sure, ask your care team. If it is safe to put it in the trash, take the medication out of the container. Mix the medication with cat litter, dirt, coffee grounds, or other  unwanted substance. Seal the mixture in a bag or container. Put it in the trash. NOTE: This sheet is a summary. It may not cover all possible information. If you have questions about this medicine, talk to your doctor, pharmacist, or health care provider.  2022 Elsevier/Gold Standard (2020-10-26 00:00:00)    If you have  been instructed to have an in-person evaluation today at a local Urgent Care facility, please use the link below. It will take you to a list of all of our available [facility], including address, phone number and hours of operation. Please do not delay care.  [facility]  If you or a family member do not have a primary care provider, use the link below to schedule a visit and establish care. When you choose a [facility] primary care physician or advanced practice provider, you gain a long-term partner in health. Find a Primary Care Provider  Learn more about [facility] in-office and virtual care options: [facility]

## 2022-01-03 NOTE — Progress Notes (Signed)
?Virtual Visit Consent  ? ?Olivia Henderson, you are scheduled for a virtual visit with a [facility] provider today.   ?  ?Just as with appointments in the office, your consent must be obtained to participate.  Your consent will be active for this visit and any virtual visit you may have with one of our providers in the next 365 days.   ?  ?If you have a MyChart account, a copy of this consent can be sent to you electronically.  All virtual visits are billed to your insurance company just like a traditional visit in the office.   ? ?As this is a virtual visit, video technology does not allow for your provider to perform a traditional examination.  This may limit your provider's ability to fully assess your condition.  If your provider identifies any concerns that need to be evaluated in person or the need to arrange testing (such as labs, EKG, etc.), we will make arrangements to do so.   ?  ?Although advances in technology are sophisticated, we cannot ensure that it will always work on either your end or our end.  If the connection with a video visit is poor, the visit may have to be switched to a telephone visit.  With either a video or telephone visit, we are not always able to ensure that we have a secure connection.    ? ?I need to obtain your verbal consent now.   Are you willing to proceed with your visit today?  ?  ?Olivia Henderson has provided verbal consent on 01/03/2022 for a virtual visit (video or telephone). ?  ?Margaretann Loveless, PA-C  ? ?Date: 01/03/2022 4:14 PM ? ? ?Virtual Visit via Video Note  ? ?Olivia Henderson, connected with  Olivia Henderson  ([id number], 04-13-1991) on 01/03/22 at  4:15 PM EST by a video-enabled telemedicine application and verified that I am speaking with the correct person using two identifiers. ? ?Location: ?Patient: Virtual Visit Location Patient: Home ?Provider: Virtual Visit Location Provider: Home Office ?  ?I discussed the limitations of  evaluation and management by telemedicine and the availability of in person appointments. The patient expressed understanding and agreed to proceed.   ? ?History of Present Illness: ?Olivia Henderson is a 31 y.o. who identifies as a female who was assigned female at birth, and is being seen today for Covid 71. ? ?HPI: URI  ?This is a new problem. Episode onset: tested positive for Covid 19 today on at home test; symptoms started last night. The problem has been gradually worsening. The maximum temperature recorded prior to her arrival was 101 - 101.9 F. The fever has been present for Less than 1 day. Associated symptoms include congestion, coughing, headaches, nausea, rhinorrhea, sinus pain, a sore throat (mild last night) and vomiting (last night; 4 times). Pertinent negatives include no diarrhea, ear pain or plugged ear sensation. Associated symptoms comments: Chills, body aches, post nasal drainage. She has tried acetaminophen, increased fluids and sleep for the symptoms. The treatment provided mild relief.   ? ? ?Problems:  ?Patient Active Problem List  ? Diagnosis Date Noted  ? Mixed hyperlipidemia 10/13/2021  ? Vitamin D deficiency 10/11/2021  ? Hypertriglyceridemia 10/11/2021  ? Acute non-recurrent maxillary sinusitis 09/03/2020  ? Obesity (BMI 30-39.9) 08/13/2020  ? Annual physical exam 08/12/2020  ? S/P cesarean section 10/23/2019  ? VBAC, delivered 09/12/2019  ? ADD (attention deficit disorder) 06/03/2016  ? Hypothyroidism 07/10/2014  ?  ?  Allergies:  ?Allergies  ?Allergen Reactions  ? Adipex-P [Phentermine]   ?  Jittery  ? Contrave [Naltrexone-Bupropion Hcl Er]   ?  H/a  ? ?Medications:  ?Current Outpatient Medications:  ?  molnupiravir EUA (LAGEVRIO) 200 mg CAPS capsule, Take 4 capsules (800 mg total) by mouth 2 (two) times daily for 5 days., Disp: 40 capsule, Rfl: 0 ?  ondansetron (ZOFRAN-ODT) 4 MG disintegrating tablet, Take 1 tablet (4 mg total) by mouth every 8 (eight) hours as needed for  nausea or vomiting., Disp: 20 tablet, Rfl: 0 ?  albuterol (VENTOLIN HFA) 108 (90 Base) MCG/ACT inhaler, Inhale 2 puffs into the lungs every 6 (six) hours as needed for wheezing or shortness of breath., Disp: 18 g, Rfl: 0 ?  Cholecalciferol (VITAMIN D-3) 125 MCG (5000 UT) TABS, Use as directed 1 tablet in the mouth or throat daily., Disp: 30 tablet, Rfl: 0 ?  guaiFENesin-codeine 100-10 MG/5ML syrup, Take 5 mLs by mouth 2 (two) times daily as needed for cough., Disp: 118 mL, Rfl: 0 ?  levonorgestrel (MIRENA) 20 MCG/24HR IUD, 1 each by Intrauterine route once., Disp: , Rfl:  ?  levothyroxine (SYNTHROID) 100 MCG tablet, TAKE 1 TABLET BY MOUTH DAILY BEFORE BREAKFAST, Disp: 90 tablet, Rfl: 3 ?  meloxicam (MOBIC) 15 MG tablet, Take 1 tablet (15 mg total) by mouth daily., Disp: 30 tablet, Rfl: 3 ? ?Observations/Objective: ?Patient is well-developed, well-nourished in no acute distress.  ?Patient appears ill ?Resting comfortably at home.  ?Head is normocephalic, atraumatic.  ?No labored breathing.  ?Speech is clear and coherent with logical content.  ?Patient is alert and oriented at baseline.  ? ? ?Assessment and Plan: ?1. COVID-19 ?- ondansetron (ZOFRAN-ODT) 4 MG disintegrating tablet; Take 1 tablet (4 mg total) by mouth every 8 (eight) hours as needed for nausea or vomiting.  Dispense: 20 tablet; Refill: 0 ?- molnupiravir EUA (LAGEVRIO) 200 mg CAPS capsule; Take 4 capsules (800 mg total) by mouth 2 (two) times daily for 5 days.  Dispense: 40 capsule; Refill: 0 ? ?- Continue OTC symptomatic management of choice ?- Will send OTC vitamins and supplement information through AVS ?- Molnupiravir and Zofran prescribed ?- Patient enrolled in MyChart symptom monitoring ?- Push fluids ?- Rest as needed ?- Discussed return precautions and when to seek in-person evaluation, sent via AVS as well ? ?Follow Up Instructions: ?I discussed the assessment and treatment plan with the patient. The patient was provided an opportunity to ask  questions and all were answered. The patient agreed with the plan and demonstrated an understanding of the instructions.  A copy of instructions were sent to the patient via MyChart unless otherwise noted below.  ? ?The patient was advised to call back or seek an in-person evaluation if the symptoms worsen or if the condition fails to improve as anticipated. ? ?Time:  ?I spent 15 minutes with the patient via telehealth technology discussing the above problems/concerns.   ? ?Margaretann Loveless, PA-C ?

## 2022-01-10 ENCOUNTER — Ambulatory Visit: Payer: 59 | Admitting: Obstetrics & Gynecology

## 2022-04-05 ENCOUNTER — Other Ambulatory Visit: Payer: Self-pay

## 2022-07-06 ENCOUNTER — Other Ambulatory Visit: Payer: Self-pay

## 2022-07-12 DIAGNOSIS — H52213 Irregular astigmatism, bilateral: Secondary | ICD-10-CM | POA: Diagnosis not present

## 2022-07-25 ENCOUNTER — Other Ambulatory Visit: Payer: Self-pay

## 2022-08-06 ENCOUNTER — Telehealth: Payer: 59 | Admitting: Physician Assistant

## 2022-08-06 DIAGNOSIS — N898 Other specified noninflammatory disorders of vagina: Secondary | ICD-10-CM

## 2022-08-06 MED ORDER — FLUCONAZOLE 150 MG PO TABS: 150.0000 mg | ORAL_TABLET | Freq: Once | ORAL | 0 refills | Status: AC

## 2022-08-06 NOTE — Progress Notes (Signed)

## 2022-08-06 NOTE — Progress Notes (Signed)
Message sent to patient requesting further input regarding current symptoms. Awaiting patient response.  

## 2022-08-06 NOTE — Progress Notes (Signed)
I have spent 5 minutes in review of e-visit questionnaire, review and updating patient chart, medical decision making and response to patient.   Vincen Bejar Cody Brodi Nery, PA-C    

## 2022-08-15 ENCOUNTER — Telehealth: Payer: 59 | Admitting: Family Medicine

## 2022-08-15 ENCOUNTER — Other Ambulatory Visit: Payer: Self-pay

## 2022-08-15 DIAGNOSIS — J069 Acute upper respiratory infection, unspecified: Secondary | ICD-10-CM | POA: Diagnosis not present

## 2022-08-15 MED ORDER — BENZONATATE 100 MG PO CAPS
100.0000 mg | ORAL_CAPSULE | Freq: Two times a day (BID) | ORAL | 0 refills | Status: DC | PRN
  Filled 2022-08-15: qty 20, 10d supply, fill #0

## 2022-08-15 MED ORDER — FLUTICASONE PROPIONATE 50 MCG/ACT NA SUSP
2.0000 | Freq: Every day | NASAL | 0 refills | Status: DC
  Filled 2022-08-15: qty 16, 30d supply, fill #0

## 2022-08-15 NOTE — Progress Notes (Signed)

## 2022-08-23 ENCOUNTER — Telehealth (INDEPENDENT_AMBULATORY_CARE_PROVIDER_SITE_OTHER): Payer: 59 | Admitting: Family Medicine

## 2022-08-23 ENCOUNTER — Other Ambulatory Visit: Payer: Self-pay

## 2022-08-23 DIAGNOSIS — J329 Chronic sinusitis, unspecified: Secondary | ICD-10-CM

## 2022-08-23 DIAGNOSIS — R059 Cough, unspecified: Secondary | ICD-10-CM

## 2022-08-23 DIAGNOSIS — B3731 Acute candidiasis of vulva and vagina: Secondary | ICD-10-CM

## 2022-08-23 DIAGNOSIS — J069 Acute upper respiratory infection, unspecified: Secondary | ICD-10-CM | POA: Diagnosis not present

## 2022-08-23 DIAGNOSIS — R519 Headache, unspecified: Secondary | ICD-10-CM

## 2022-08-23 MED ORDER — FLUCONAZOLE 150 MG PO TABS
ORAL_TABLET | ORAL | 0 refills | Status: DC
  Filled 2022-08-23: qty 2, 3d supply, fill #0

## 2022-08-23 MED ORDER — BENZONATATE 100 MG PO CAPS
200.0000 mg | ORAL_CAPSULE | Freq: Two times a day (BID) | ORAL | 0 refills | Status: DC | PRN
  Filled 2022-08-23: qty 30, 8d supply, fill #0

## 2022-08-23 MED ORDER — AMOXICILLIN-POT CLAVULANATE 875-125 MG PO TABS
1.0000 | ORAL_TABLET | Freq: Two times a day (BID) | ORAL | 0 refills | Status: DC
  Filled 2022-08-23: qty 20, 10d supply, fill #0

## 2022-08-23 NOTE — Progress Notes (Signed)
Virtual Visit via Video Note  I connected with Olivia Henderson  on 08/23/22 at 10:00 AM EDT by a video enabled telemedicine application and verified that I am speaking with the correct person using two identifiers.  Location patient: [place] Location provider:work or home office Persons participating in the virtual visit: patient, provider  I discussed the limitations and requested verbal permission for telemedicine visit. The patient expressed understanding and agreed to proceed.   HPI:  Acute telemedicine visit for cough and sinus congestion: -Onset: 3 weeks ago -Symptoms include: thick nasal congestion - white, cough, was improving some but now worseningn with L maxillary sinus discomfort starting - feels has developed a sinus infection -Denies: fever, cp, SOB, NVD, malaise -drinking fluids, able to get out of bed -no known sick contacts -Has tried:flonase and tessalon -denies any chance of pregnancy -Pertinent past medical history: see below -reports hx of yeast vaginitis requiring diflucan when uses abx, requesting -Pertinent medication allergies:  Allergies  Allergen Reactions   Adipex-P [Phentermine]     Jittery   Contrave [Naltrexone-Bupropion Hcl Er]     H/a  -COVID-19 vaccine status:  Immunization History  Administered Date(s) Administered   Influenza,inj,Quad PF,6+ Mos 06/19/2019   Influenza-Unspecified 08/19/2016, 07/18/2018, 08/07/2020, 07/31/2021   PFIZER(Purple Top)SARS-COV-2 Vaccination 06/19/2020, 08/14/2020   Tdap 07/03/2019     ROS: See pertinent positives and negatives per HPI.  Past Medical History:  Diagnosis Date   Anemia    with first pregnancy   Chicken pox    Complication of anesthesia    nausea and vomiting   COVID-19    11/15/20   GERD (gastroesophageal reflux disease)    Preeclampsia    with G1   Unspecified hypothyroidism 07/10/2014    Past Surgical History:  Procedure Laterality Date   CESAREAN SECTION  2014   WISDOM TOOTH EXTRACTION        Current Outpatient Medications:    amoxicillin-clavulanate (AUGMENTIN) 875-125 MG tablet, Take 1 tablet by mouth 2 (two) times daily., Disp: 20 tablet, Rfl: 0   fluconazole (DIFLUCAN) 150 MG tablet, Take 1 tablet by mouth now and one tablet in 3 days if needed, Disp: 2 tablet, Rfl: 0   albuterol (VENTOLIN HFA) 108 (90 Base) MCG/ACT inhaler, Inhale 2 puffs into the lungs every 6 (six) hours as needed for wheezing or shortness of breath., Disp: 18 g, Rfl: 0   benzonatate (TESSALON) 100 MG capsule, Take 2 capsules (200 mg total) by mouth 2 (two) times daily as needed for cough., Disp: 30 capsule, Rfl: 0   Cholecalciferol (VITAMIN D-3) 125 MCG (5000 UT) TABS, Use as directed 1 tablet in the mouth or throat daily., Disp: 30 tablet, Rfl: 0   fluticasone (FLONASE) 50 MCG/ACT nasal spray, Place 2 sprays into both nostrils daily., Disp: 16 g, Rfl: 0   guaiFENesin-codeine 100-10 MG/5ML syrup, Take 5 mLs by mouth 2 (two) times daily as needed for cough., Disp: 118 mL, Rfl: 0   levonorgestrel (MIRENA) 20 MCG/24HR IUD, 1 each by Intrauterine route once., Disp: , Rfl:    levothyroxine (SYNTHROID) 100 MCG tablet, TAKE 1 TABLET BY MOUTH DAILY BEFORE BREAKFAST, Disp: 90 tablet, Rfl: 3   meloxicam (MOBIC) 15 MG tablet, Take 1 tablet (15 mg total) by mouth daily., Disp: 30 tablet, Rfl: 3   ondansetron (ZOFRAN-ODT) 4 MG disintegrating tablet, Take 1 tablet (4 mg total) by mouth every 8 (eight) hours as needed for nausea or vomiting., Disp: 20 tablet, Rfl: 0  EXAM:  VITALS per patient if  applicable:  GENERAL: alert, oriented, appears well and in no acute distress  HEENT: atraumatic, conjunttiva clear, no obvious abnormalities on inspection of external nose and ears, points to L maxillary region as area of facial discomfort  NECK: normal movements of the head and neck  LUNGS: on inspection no signs of respiratory distress, breathing rate appears normal, no obvious gross SOB, gasping or wheezing  CV: no  obvious cyanosis  MS: moves all visible extremities without noticeable abnormality  PSYCH/NEURO: pleasant and cooperative, no obvious depression or anxiety, speech and thought processing grossly intact  ASSESSMENT AND PLAN:  Discussed the following assessment and plan:  Rhinosinusitis  URI with cough and congestion - Plan: benzonatate (TESSALON) 100 MG capsule  Yeast vaginitis  Facial discomfort  Cough, unspecified type  -we discussed possible serious and likely etiologies, options for evaluation and workup, limitations of telemedicine visit vs in person visit, treatment, treatment risks and precautions. Pt is agreeable to treatment via telemedicine at this moment. Query develping bacterial sinusitis vs other. She agrees to self covid test. Discussed risks/appropriate use abx. She has already tried nasal saline rinses and flonase. Opted for augmentin per her preference of options. She gets yeast vaginitis with abx - requested diflucan - sent. Also refill cough med. She reports 100mg  dose was not working will try Tessalon 200mg  bid prn.   Advised to seek prompt virtual visit or in person care if worsening, new symptoms arise, or if is not improving with treatment as expected per our conversation of expected course. Discussed options for follow up care. Did let this patient know that I do telemedicine on Tuesdays and Thursdays for [facility] and those are the days I am logged into the system.  I discussed the assessment and treatment plan with the patient. The patient was provided an opportunity to ask questions and all were answered. The patient agreed with the plan and demonstrated an understanding of the instructions.     Lucretia Kern, DO

## 2022-08-23 NOTE — Patient Instructions (Signed)
-I sent the medication(s) we discussed to your pharmacy: Meds ordered this encounter  Medications   amoxicillin-clavulanate (AUGMENTIN) 875-125 MG tablet    Sig: Take 1 tablet by mouth 2 (two) times daily.    Dispense:  20 tablet    Refill:  0   fluconazole (DIFLUCAN) 150 MG tablet    Sig: Take 1 tablet by mouth now and one tablet in 3 days if needed    Dispense:  2 tablet    Refill:  0   benzonatate (TESSALON) 100 MG capsule    Sig: Take 2 capsules (200 mg total) by mouth 2 (two) times daily as needed for cough.    Dispense:  30 capsule    Refill:  0     I hope you are feeling better soon!  Seek in person care promptly if your symptoms worsen, new concerns arise or you are not improving with treatment.  It was nice to meet you today. I help [facility] out with telemedicine visits on Tuesdays and Thursdays and am happy to help if you need a virtual follow up visit on those days. Otherwise, if you have any concerns or questions following this visit please schedule a follow up visit with your Primary Care office or seek care at a local urgent care clinic to avoid delays in care. If you are having severe or life threatening symptoms please call 911 and/or go to the nearest emergency room.    Amoxicillin; Clavulanic Acid Tablets What is this medication? AMOXICILLIN; CLAVULANIC ACID (a mox i SIL in; KLAV yoo lan ic AS id) treats infections caused by bacteria. It belongs to a group of medications called penicillin antibiotics. It will not treat colds, the flu, or infections caused by viruses. This medicine may be used for other purposes; ask your health care provider or pharmacist if you have questions. COMMON BRAND NAME(S): Augmentin What should I tell my care team before I take this medication? They need to know if you have any of these conditions: Kidney disease Liver disease Mononucleosis Stomach or intestine problems such as colitis An unusual or allergic reaction to amoxicillin,  other penicillin or cephalosporin antibiotics, clavulanic acid, other medications, foods, dyes, or preservatives Pregnant or trying to get pregnant Breast-feeding How should I use this medication? Take this medication by mouth. Take it as directed on the prescription label at the same time every day. Take it with food at the start of a meal or snack. Take all of this medication unless your care team tells you to stop it early. Keep taking it even if you think you are better. Talk to your care team about the use of this medication in children. While it may be prescribed for selected conditions, precautions do apply. Overdosage: If you think you have taken too much of this medicine contact a poison control center or emergency room at once. NOTE: This medicine is only for you. Do not share this medicine with others. What if I miss a dose? If you miss a dose, take it as soon as you can. If it is almost time for your next dose, take only that dose. Do not take double or extra doses. What may interact with this medication? Allopurinol Anticoagulants Birth control pills Methotrexate Probenecid This list may not describe all possible interactions. Give your health care provider a list of all the medicines, herbs, non-prescription drugs, or dietary supplements you use. Also tell them if you smoke, drink alcohol, or use illegal drugs. Some items may  interact with your medicine. What should I watch for while using this medication? Tell your care team if your symptoms do not start to get better or if they get worse. This medication may cause serious skin reactions. They can happen weeks to months after starting the medication. Contact your care team right away if you notice fevers or flu-like symptoms with a rash. The rash may be red or purple and then turn into blisters or peeling of the skin. Or, you might notice a red rash with swelling of the face, lips or lymph nodes in your neck or under your arms. Do  not treat diarrhea with over the counter products. Contact your care team if you have diarrhea that lasts more than 2 days or if it is severe and watery. If you have diabetes, you may get a false-positive result for sugar in your urine. Check with your care team. Birth control may not work properly while you are taking this medication. Talk to your care team about using an extra method of birth control. What side effects may I notice from receiving this medication? Side effects that you should report to your care team as soon as possible: Allergic reactions--skin rash, itching, hives, swelling of the face, lips, tongue, or throat Liver injury--right upper belly pain, loss of appetite, nausea, light-colored stool, dark yellow or brown urine, yellowing skin or eyes, unusual weakness or fatigue Redness, blistering, peeling, or loosening of the skin, including inside the mouth Severe diarrhea, fever Unusual vaginal discharge, itching, or odor Side effects that usually do not require medical attention (report to your care team if they continue or are bothersome): Diarrhea Nausea Vomiting This list may not describe all possible side effects. Call your doctor for medical advice about side effects. You may report side effects to FDA at 1-800-FDA-1088. Where should I keep my medication? Keep out of the reach of children and pets. Store at room temperature between 20 and 25 degrees C (68 and 77 degrees F). Throw away any unused medication after the expiration date. NOTE: This sheet is a summary. It may not cover all possible information. If you have questions about this medicine, talk to your doctor, pharmacist, or health care provider.  2023 Elsevier/Gold Standard (2020-10-11 00:00:00)

## 2022-09-04 ENCOUNTER — Telehealth: Payer: 59 | Admitting: Physician Assistant

## 2022-09-04 DIAGNOSIS — B379 Candidiasis, unspecified: Secondary | ICD-10-CM | POA: Diagnosis not present

## 2022-09-04 DIAGNOSIS — T3695XA Adverse effect of unspecified systemic antibiotic, initial encounter: Secondary | ICD-10-CM

## 2022-09-05 ENCOUNTER — Other Ambulatory Visit: Payer: Self-pay

## 2022-09-05 MED ORDER — FLUCONAZOLE 150 MG PO TABS
150.0000 mg | ORAL_TABLET | ORAL | 0 refills | Status: DC | PRN
  Filled 2022-09-05: qty 2, 6d supply, fill #0

## 2022-09-05 NOTE — Progress Notes (Signed)

## 2022-09-09 ENCOUNTER — Telehealth: Payer: Self-pay

## 2022-09-09 NOTE — Telephone Encounter (Signed)
Patient called back to follow-up on her previous message since she hasn't heard from anyone.

## 2022-09-09 NOTE — Telephone Encounter (Signed)
Patient states she had a virtual visit with Kriste Basque, DO, about a week ago and she was prescribed an antibiotic for a sinus infection.  Patient had to discontinue antibiotic because it was causing patient to have a yeast infection.  Patient states she would like to see if we can prescribe her a new antibiotic.  Patient states she cannot take augmentin or amoxicillin.  Patient states her sinus infection symptoms have gotten worse since she stopped taking the antibiotic.  Patient states she now has a productive cough, where she is coughing up thick, yellow, chuncky stuff.  Patient states her chest is sore from coughing so much.  *Patient's preferred pharmacy is [facility].

## 2022-09-12 NOTE — Telephone Encounter (Signed)
Patient called about Telephone note, no one has called her.

## 2022-09-12 NOTE — Telephone Encounter (Signed)
Patient called about getting another antibiotic. Patient saw Kriste Basque last Thursday. Kriste Basque is not in the office until tomorrow and patient has been waiting for an answer since Friday.

## 2022-09-12 NOTE — Telephone Encounter (Signed)
LVM to call back.

## 2022-09-13 ENCOUNTER — Encounter: Payer: Self-pay | Admitting: Family Medicine

## 2022-09-13 ENCOUNTER — Other Ambulatory Visit: Payer: Self-pay

## 2022-09-13 ENCOUNTER — Telehealth (INDEPENDENT_AMBULATORY_CARE_PROVIDER_SITE_OTHER): Payer: 59 | Admitting: Family Medicine

## 2022-09-13 DIAGNOSIS — R0981 Nasal congestion: Secondary | ICD-10-CM

## 2022-09-13 DIAGNOSIS — R059 Cough, unspecified: Secondary | ICD-10-CM | POA: Diagnosis not present

## 2022-09-13 MED ORDER — PROMETHAZINE-DM 6.25-15 MG/5ML PO SYRP
5.0000 mL | ORAL_SOLUTION | Freq: Two times a day (BID) | ORAL | 0 refills | Status: DC | PRN
  Filled 2022-09-13: qty 118, 12d supply, fill #0

## 2022-09-13 MED ORDER — DOXYCYCLINE HYCLATE 100 MG PO TABS
100.0000 mg | ORAL_TABLET | Freq: Two times a day (BID) | ORAL | 0 refills | Status: DC
  Filled 2022-09-13: qty 14, 7d supply, fill #0

## 2022-09-13 NOTE — Progress Notes (Signed)
Virtual Visit via Video Note  I connected with Olivia Henderson  on 09/13/22 at  4:40 PM EST by a video enabled telemedicine application and verified that I am speaking with the correct person using two identifiers.  Location patient: [place] Location provider:work or home office Persons participating in the virtual visit: patient, provider  I discussed the limitations and requested verbal permission for telemedicine visit. The patient expressed understanding and agreed to proceed.   HPI:  Acute telemedicine visit for "sinus infection": -Onset: a few weeks ago, was treated with augmentin last week and was improving, but developed a bad yeast infection so stopped it and symptoms returned, she used diflucan for the yeast which resolved -Symptoms include: thick nasal congestion - yellow, facial discomfort returned, cough -wants different abx -Denies:fever, CP, SOB, worst HA -Has tried:tessalon - not working and prefers cough syrup -denies any chance of pregnancy -Pertinent past medical history: see below -Pertinent medication allergies: Allergies  Allergen Reactions   Adipex-P [Phentermine]     Jittery   Contrave [Naltrexone-Bupropion Hcl Er]     H/a  -COVID-19 vaccine status:  Immunization History  Administered Date(s) Administered   Influenza,inj,Quad PF,6+ Mos 06/19/2019   Influenza-Unspecified 08/19/2016, 07/18/2018, 08/07/2020, 07/31/2021   PFIZER(Purple Top)SARS-COV-2 Vaccination 06/19/2020, 08/14/2020   Tdap 07/03/2019     ROS: See pertinent positives and negatives per HPI.  Past Medical History:  Diagnosis Date   Anemia    with first pregnancy   Chicken pox    Complication of anesthesia    nausea and vomiting   COVID-19    11/15/20   GERD (gastroesophageal reflux disease)    Preeclampsia    with G1   Unspecified hypothyroidism 07/10/2014    Past Surgical History:  Procedure Laterality Date   CESAREAN SECTION  2014   WISDOM TOOTH EXTRACTION       Current Outpatient  Medications:    benzonatate (TESSALON) 100 MG capsule, Take 2 capsules (200 mg total) by mouth 2 (two) times daily as needed for cough., Disp: 30 capsule, Rfl: 0   Cholecalciferol (VITAMIN D-3) 125 MCG (5000 UT) TABS, Use as directed 1 tablet in the mouth or throat daily., Disp: 30 tablet, Rfl: 0   doxycycline (VIBRA-TABS) 100 MG tablet, Take 1 tablet (100 mg total) by mouth 2 (two) times daily., Disp: 14 tablet, Rfl: 0   fluticasone (FLONASE) 50 MCG/ACT nasal spray, Place 2 sprays into both nostrils daily., Disp: 16 g, Rfl: 0   levonorgestrel (MIRENA) 20 MCG/24HR IUD, 1 each by Intrauterine route once., Disp: , Rfl:    levothyroxine (SYNTHROID) 100 MCG tablet, TAKE 1 TABLET BY MOUTH DAILY BEFORE BREAKFAST, Disp: 90 tablet, Rfl: 3   promethazine-dextromethorphan (PROMETHAZINE-DM) 6.25-15 MG/5ML syrup, Take 5 mLs by mouth 2 (two) times daily as needed for cough., Disp: 118 mL, Rfl: 0   albuterol (VENTOLIN HFA) 108 (90 Base) MCG/ACT inhaler, Inhale 2 puffs into the lungs every 6 (six) hours as needed for wheezing or shortness of breath. (Patient not taking: Reported on 09/13/2022), Disp: 18 g, Rfl: 0   fluconazole (DIFLUCAN) 150 MG tablet, Take 1 tablet (150 mg total) by mouth every 3 (three) days as needed. (Patient not taking: Reported on 09/13/2022), Disp: 2 tablet, Rfl: 0   meloxicam (MOBIC) 15 MG tablet, Take 1 tablet (15 mg total) by mouth daily. (Patient not taking: Reported on 09/13/2022), Disp: 30 tablet, Rfl: 3   ondansetron (ZOFRAN-ODT) 4 MG disintegrating tablet, Take 1 tablet (4 mg total) by mouth every 8 (eight) hours  as needed for nausea or vomiting. (Patient not taking: Reported on 09/13/2022), Disp: 20 tablet, Rfl: 0  EXAM:  VITALS per patient if applicable:  GENERAL: alert, oriented, appears well and in no acute distress  HEENT: atraumatic, conjunttiva clear, no obvious abnormalities on inspection of external nose and ears  NECK: normal movements of the head and neck  LUNGS: on  inspection no signs of respiratory distress, breathing rate appears normal, no obvious gross SOB, gasping or wheezing  CV: no obvious cyanosis  MS: moves all visible extremities without noticeable abnormality  PSYCH/NEURO: pleasant and cooperative, no obvious depression or anxiety, speech and thought processing grossly intact  ASSESSMENT AND PLAN:  Discussed the following assessment and plan:  Nasal congestion  Cough, unspecified type  -we discussed possible serious and likely etiologies, options for evaluation and workup, limitations of telemedicine visit vs in person visit, treatment, treatment risks and precautions. Pt is agreeable to treatment via telemedicine at this moment. Query partially treated sinusitis vs other. She has opted to try doxy 100mg  bid x 7 days and a cough syrup. Discussed risks, return/follow up precautions.  Advised to seek prompt in person care if worsening, new symptoms arise, or if is not improving with treatment as expected per our conversation of expected course. Discussed options for follow up care. Did let this patient know that I do telemedicine on Tuesdays and Thursdays for  and those are the days I am logged into the system. Advised to schedule follow up visit with PCP, [facility] virtual visits or [facility] if any further questions or concerns to avoid delays in care.   I discussed the assessment and treatment plan with the patient. The patient was provided an opportunity to ask questions and all were answered. The patient agreed with the plan and demonstrated an understanding of the instructions.     04-01-1981, DO

## 2022-09-13 NOTE — Telephone Encounter (Signed)
Spoke to patient and she stated that she has a VV with Selena Batten today and I stated that I would check back in with her tomorrow to see how things were after the VV

## 2022-09-13 NOTE — Patient Instructions (Addendum)
-I sent the medication(s) we discussed to your pharmacy: Meds ordered this encounter  Medications   doxycycline (VIBRA-TABS) 100 MG tablet    Sig: Take 1 tablet (100 mg total) by mouth 2 (two) times daily.    Dispense:  14 tablet    Refill:  0   promethazine-dextromethorphan (PROMETHAZINE-DM) 6.25-15 MG/5ML syrup    Sig: Take 5 mLs by mouth 2 (two) times daily as needed for cough.    Dispense:  118 mL    Refill:  0     I hope you are feeling better soon!  Seek in person care promptly if your symptoms worsen, new concerns arise or you are not improving with treatment.  It was nice to meet you today. I help [facility] out with telemedicine visits on Tuesdays and Thursdays and am happy to help if you need a virtual follow up visit on those days. Otherwise, if you have any concerns or questions following this visit please schedule a follow up visit with your Primary Care office or seek care at a local urgent care clinic to avoid delays in care. If you are having severe or life threatening symptoms please call 911 and/or go to the nearest emergency room.     Doxycycline Capsules or Tablets What is this medication? DOXYCYCLINE (dox i SYE kleen) treats infections caused by bacteria. It belongs to a group of medications called tetracycline antibiotics. It will not treat colds, the flu, or infections caused by viruses. This medicine may be used for other purposes; ask your health care provider or pharmacist if you have questions. COMMON BRAND NAME(S): Acticlate, Adoxa, Adoxa CK, Adoxa Pak, Adoxa TT, Alodox, Avidoxy, Doxal, LYMEPAK, Mondoxyne NL, Monodox, Morgidox 1x, Morgidox 1x Kit, Morgidox 2x, Morgidox 2x Kit, NutriDox, Ocudox, [place], [place], Gray, Vibra-Tabs, Vibramycin What should I tell my care team before I take this medication? They need to know if you have any of these conditions: Kidney disease Liver disease Long exposure to sunlight like working outdoors Recent stomach  surgery Stomach or intestine problems such as colitis Vision Problems Yeast or fungal infection of the mouth or vagina An unusual or allergic reaction to doxycycline, tetracycline antibiotics, other medications, foods, dyes, or preservatives Pregnant or trying to get pregnant Breast-feeding How should I use this medication? Take this medication by mouth with water. Take it as directed on the prescription label at the same time every day. It is best to take this medication without food, but if it upsets your stomach take it with food. Take all of this medication unless your care team tells you to stop it early. Keep taking it even if you think you are better. Take antacids and products with aluminum, calcium, magnesium, iron, and zinc in them at a different time of day than this medication. Talk to your care team if you have questions. Talk to your care team regarding the use of this medication in children. While this medication may be prescribed for selected conditions, precautions do apply. Overdosage: If you think you have taken too much of this medicine contact a poison control center or emergency room at once. NOTE: This medicine is only for you. Do not share this medicine with others. What if I miss a dose? If you miss a dose, take it as soon as you can. If it is almost time for your next dose, take only that dose. Do not take double or extra doses. What may interact with this medication? Antacids, vitamins, or other products that contain aluminum,  calcium, iron, magnesium, or zinc Barbiturates Birth control pills Bismuth subsalicylate Carbamazepine Methoxyflurane Oral retinoids such as acitretin, isotretinoin Other antibiotics Phenytoin Warfarin This list may not describe all possible interactions. Give your health care provider a list of all the medicines, herbs, non-prescription drugs, or dietary supplements you use. Also tell them if you smoke, drink alcohol, or use illegal drugs.  Some items may interact with your medicine. What should I watch for while using this medication? Tell your care team if your symptoms do not improve. Do not treat diarrhea with over the counter products. Contact your care team if you have diarrhea that lasts more than 2 days or if it is severe and watery. Do not take this medication just before going to bed. It may not dissolve properly when you lay down and can cause pain in your throat. Drink plenty of fluids while taking this medication to also help reduce irritation in your throat. This medication can make you more sensitive to the sun. Keep out of the sun. If you cannot avoid being in the sun, wear protective clothing and use sunscreen. Do not use sun lamps or tanning beds/booths. Birth control pills may not work properly while you are taking this medication. Talk to your care team about using an extra method of birth control. If you are being treated for a sexually transmitted infection, avoid sexual contact until you have finished your treatment. Your sexual partner may also need treatment. If you are using this medication to prevent malaria, you should still protect yourself from contact with mosquitos. Stay in screened-in areas, use mosquito nets, keep your body covered, and use an insect repellent. What side effects may I notice from receiving this medication? Side effects that you should report to your care team as soon as possible: Allergic reactions--skin rash, itching, hives, swelling of the face, lips, tongue, or throat Increased pressure around the brain--severe headache, change in vision, blurry vision, nausea, vomiting Joint pain Pain or trouble swallowing Redness, blistering, peeling, or loosening of the skin, including inside the mouth Severe diarrhea, fever Unusual vaginal discharge, itching, or odor Side effects that usually do not require medical attention (report these to your care team if they continue or are  bothersome): Change in tooth color Diarrhea Headache Heartburn Nausea This list may not describe all possible side effects. Call your doctor for medical advice about side effects. You may report side effects to FDA at 1-800-FDA-1088. Where should I keep my medication? Keep out of the reach of children and pets. Store at room temperature, below 30 degrees C (86 degrees F). Protect from light. Keep container tightly closed. Throw away any unused medication after the expiration date. Taking this medication after the expiration date can make you seriously ill. NOTE: This sheet is a summary. It may not cover all possible information. If you have questions about this medicine, talk to your doctor, pharmacist, or health care provider.  2023 Elsevier/Gold Standard (2020-10-24 00:00:00)

## 2022-09-14 NOTE — Telephone Encounter (Signed)
Spoke to pt and she stated that she had VV on 11/14 and she was prescribed an  antibiotic and cough syrup Pt stated that she is feeling better today.

## 2022-10-14 ENCOUNTER — Encounter: Payer: 59 | Admitting: Internal Medicine

## 2022-10-27 ENCOUNTER — Ambulatory Visit
Admission: EM | Admit: 2022-10-27 | Discharge: 2022-10-27 | Disposition: A | Discharge status: Home / Self Care | Payer: 59 | Attending: Urgent Care | Admitting: Urgent Care

## 2022-10-27 ENCOUNTER — Other Ambulatory Visit: Payer: Self-pay

## 2022-10-27 DIAGNOSIS — R6889 Other general symptoms and signs: Secondary | ICD-10-CM

## 2022-10-27 MED ORDER — BENZONATATE 100 MG PO CAPS
ORAL_CAPSULE | ORAL | 0 refills | Status: DC
  Filled 2022-10-27: qty 30, 5d supply, fill #0

## 2022-10-27 NOTE — ED Triage Notes (Signed)
Pt. Presents to UC w/ c/o a fever, cough and an episode of dizziness that almost caused her to pass out. Pt. States she symptoms started 3 days ago. Pt. Also mentions her Son recently tested positive for the flu.

## 2022-10-27 NOTE — ED Provider Notes (Signed)
Renaldo Fiddler    CSN: [id number] Arrival date & time: 10/27/22  0801      History   Chief Complaint No chief complaint on file.   HPI Olivia Henderson is a 31 y.o. female.   HPI  Presents to urgent care with complaint of fever, cough and an episode of dizziness yesterday.  She states the dizziness caused near syncope.  Symptoms started about 3 days ago.  She reports pain with coughing in the center of her chest.  Most recent fever with a this morning with 101xF fever is successfully treated with Tylenol/ibuprofen.  She has been trying to use previously prescribed promethazine DM for her cough and finds that it makes her dizzy and sleepy and does not help with the cough.  She requests different medication.  She states she tried OTC medication without success.Cherlynn Perches direct contact with her son who recently tested positive for flu.  Past Medical History:  Diagnosis Date   Anemia    with first pregnancy   Chicken pox    Complication of anesthesia    nausea and vomiting   COVID-19    11/15/20   GERD (gastroesophageal reflux disease)    Preeclampsia    with G1   Unspecified hypothyroidism 07/10/2014    Patient Active Problem List   Diagnosis Date Noted   Mixed hyperlipidemia 10/13/2021   Vitamin D deficiency 10/11/2021   Hypertriglyceridemia 10/11/2021   Acute non-recurrent maxillary sinusitis 09/03/2020   Obesity (BMI 30-39.9) 08/13/2020   Annual physical exam 08/12/2020   S/P cesarean section 10/23/2019   VBAC, delivered 09/12/2019   ADD (attention deficit disorder) 06/03/2016   Hypothyroidism 07/10/2014    Past Surgical History:  Procedure Laterality Date   CESAREAN SECTION  2014   WISDOM TOOTH EXTRACTION      OB History     Gravida  2   Para  2   Term  2   Preterm      AB      Living  2      SAB      IAB      Ectopic      Multiple  0   Live Births  2            Home Medications    Prior to Admission  medications   Medication Sig Start Date End Date Taking? Authorizing Provider  albuterol (VENTOLIN HFA) 108 (90 Base) MCG/ACT inhaler Inhale 2 puffs into the lungs every 6 (six) hours as needed for wheezing or shortness of breath. Patient not taking: Reported on 09/13/2022 11/04/21   Elenore Paddy, NP  benzonatate (TESSALON) 100 MG capsule Take 2 capsules (200 mg total) by mouth 2 (two) times daily as needed for cough. 08/23/22   Terressa Koyanagi, DO  Cholecalciferol (VITAMIN D-3) 125 MCG (5000 UT) TABS Use as directed 1 tablet in the mouth or throat daily. 08/17/20   McLean-Scocuzza, Pasty Spillers, MD  doxycycline (VIBRA-TABS) 100 MG tablet Take 1 tablet (100 mg total) by mouth 2 (two) times daily. 09/13/22   Terressa Koyanagi, DO  fluconazole (DIFLUCAN) 150 MG tablet Take 1 tablet (150 mg total) by mouth every 3 (three) days as needed. Patient not taking: Reported on 09/13/2022 09/05/22   Margaretann Loveless, PA-C  fluticasone [facility]) 50 MCG/ACT nasal spray Place 2 sprays into both nostrils daily. 08/15/22   Freddy Finner, NP  levonorgestrel (MIRENA) 20 MCG/24HR IUD 1 each by Intrauterine route once.  [provider]  levothyroxine (SYNTHROID) 100 MCG tablet TAKE 1 TABLET BY MOUTH DAILY BEFORE BREAKFAST 10/13/21 10/13/22  McLean-Scocuzza, Pasty Spillers, MD  meloxicam (MOBIC) 15 MG tablet Take 1 tablet (15 mg total) by mouth daily. Patient not taking: Reported on 09/13/2022 08/04/21   Edwin Cap, DPM  ondansetron (ZOFRAN-ODT) 4 MG disintegrating tablet Take 1 tablet (4 mg total) by mouth every 8 (eight) hours as needed for nausea or vomiting. Patient not taking: Reported on 09/13/2022 01/03/22   Margaretann Loveless, PA-C  promethazine-dextromethorphan (PROMETHAZINE-DM) 6.25-15 MG/5ML syrup Take 5 mLs by mouth 2 (two) times daily as needed for cough. 09/13/22   Terressa Koyanagi, DO    Family History Family History  Problem Relation Age of Onset   Healthy Mother    Kidney cancer Maternal Grandmother     Heart disease Maternal Grandmother        CHF   Diabetes Maternal Grandfather    Hypertension Father     Social History Social History   Tobacco Use   Smoking status: Never   Smokeless tobacco: Never  Vaping Use   Vaping Use: Never used  Substance Use Topics   Alcohol use: No   Drug use: No     Allergies   Adipex-p [phentermine] and Contrave [naltrexone-bupropion hcl er]   Review of Systems Review of Systems   Physical Exam Triage Vital Signs ED Triage Vitals  Enc Vitals Group     BP      Pulse      Resp      Temp      Temp src      SpO2      Weight      Height      Head Circumference      Peak Flow      Pain Score      Pain Loc      Pain Edu?      Excl. in GC?    No data found.  Updated Vital Signs There were no vitals taken for this visit.  Visual Acuity Right Eye Distance:   Left Eye Distance:   Bilateral Distance:    Right Eye Near:   Left Eye Near:    Bilateral Near:     Physical Exam Vitals reviewed.  Constitutional:      Appearance: Normal appearance.  HENT:     Right Ear: Tympanic membrane and ear canal normal.     Left Ear: Tympanic membrane and ear canal normal.     Mouth/Throat:     Mouth: Mucous membranes are moist.     Pharynx: Posterior oropharyngeal erythema present. No oropharyngeal exudate.  Cardiovascular:     Rate and Rhythm: Normal rate and regular rhythm.     Pulses: Normal pulses.     Heart sounds: Normal heart sounds.  Pulmonary:     Effort: Pulmonary effort is normal.     Breath sounds: Normal breath sounds.  Skin:    General: Skin is warm and dry.  Neurological:     General: No focal deficit present.     Mental Status: She is alert and oriented to person, place, and time.  Psychiatric:        Mood and Affect: Mood normal.        Behavior: Behavior normal.      UC Treatments / Results  Labs (all labs ordered are listed, but only abnormal results are displayed) Labs Reviewed - No data to  display  EKG   Radiology No results found.  Procedures Procedures (including critical care time)  Medications Ordered in UC Medications - No data to display  Initial Impression / Assessment and Plan / UC Course  I have reviewed the triage vital signs and the nursing notes.  Pertinent labs & imaging results that were available during my care of the patient were reviewed by me and considered in my medical decision making (see chart for details).   Patient is afebrile here with recent antipyretics. Satting well on room air. Overall is ill appearing, well hydrated, without respiratory distress. Pulmonary exam is unremarkable.  Lungs CTAB without wheezing, rhonchi, rales.  Mild pharyngeal erythema with no peritonsillar exudates.  TMs are WNL bilaterally.  Suspect viral process including influenza.  She is outside the treatment window for influenza A and so no antiviral was offered.  Will prescribe benzonatate for her cough and suggest that she use this medication in addition to any other over-the-counter or prescription cough medication she might have.  Encouraging her to continue OTC antipyretics to control her fever.  Will provide information for other OTC medications that might help her.    Final Clinical Impressions(s) / UC Diagnoses   Final diagnoses:  None   Discharge Instructions   None    ED Prescriptions   None    PDMP not reviewed this encounter.   Charma Igo, [place] 10/27/22 [phone]

## 2022-10-27 NOTE — Discharge Instructions (Signed)
You have been diagnosed with a viral upper respiratory infection based on your symptoms and exam. Viral illnesses cannot be treated with antibiotics - they are self limiting - and you should find your symptoms resolving within a few days. Get plenty of rest and non-caffeinated fluids. Watch for signs of dehydration including reduced urine output and dark colored urine.  You are outside the treatment window for influenza A which is your presumptive diagnosis.  We recommend you use over-the-counter medications for symptom control including acetaminophen (Tylenol), ibuprofen (Advil/Motrin) or naproxen (Aleve) for fever, chills or body aches. You may combine use of acetaminophen and ibuprofen/naproxen if needed. Also recommend  cold/cough medication.  I have prescribed benzonatate, a cough medicine which you can use in combination with other OTC and previously prescribed cough medicine you may have.  Saline mist spray is helpful for removing excess mucus from your nose.  Room humidifiers are helpful to ease breathing at night. I recommend guaifenesin (Mucinex) to help thin and loosen mucus secretions in your respiratory passages.   If appropriate based upon your other medical problems, you might also find relief of nasal/sinus congestion symptoms by using a nasal decongestant such as Flonase (fluticasone) or Sudafed sinus (pseudoephedrine).  You will need to obtain Sudafed from behind the pharmacist counter.  Speak to the pharmacist to verify that you are not duplicating medications with other over-the-counter formulations that you may be using.   Follow up here or with your primary care provider if your symptoms are worsening or not improving.

## 2022-10-28 ENCOUNTER — Encounter: Payer: Self-pay | Admitting: Family

## 2022-10-28 ENCOUNTER — Other Ambulatory Visit: Payer: Self-pay

## 2022-10-28 ENCOUNTER — Ambulatory Visit: Payer: 59 | Admitting: Family

## 2022-10-28 VITALS — BP 130/80 | HR 120 | Temp 98.7°F | Ht 62.0 in | Wt 196.0 lb

## 2022-10-28 DIAGNOSIS — J329 Chronic sinusitis, unspecified: Secondary | ICD-10-CM | POA: Insufficient documentation

## 2022-10-28 DIAGNOSIS — J0100 Acute maxillary sinusitis, unspecified: Secondary | ICD-10-CM

## 2022-10-28 DIAGNOSIS — R051 Acute cough: Secondary | ICD-10-CM

## 2022-10-28 LAB — POCT INFLUENZA A/B
Influenza A, POC: NEGATIVE
Influenza B, POC: NEGATIVE

## 2022-10-28 LAB — POC COVID19 BINAXNOW: SARS Coronavirus 2 Ag: NEGATIVE

## 2022-10-28 MED ORDER — GUAIFENESIN-CODEINE 100-10 MG/5ML PO SYRP
5.0000 mL | ORAL_SOLUTION | Freq: Every evening | ORAL | 0 refills | Status: DC | PRN
  Filled 2022-10-28: qty 70, 14d supply, fill #0

## 2022-10-28 MED ORDER — FLUCONAZOLE 150 MG PO TABS
150.0000 mg | ORAL_TABLET | Freq: Once | ORAL | 1 refills | Status: AC
Start: 2022-10-28 — End: 2022-10-30
  Filled 2022-10-28: qty 2, 2d supply, fill #0

## 2022-10-28 MED ORDER — CEFDINIR 300 MG PO CAPS
300.0000 mg | ORAL_CAPSULE | Freq: Two times a day (BID) | ORAL | 0 refills | Status: AC
Start: 2022-10-28 — End: 2022-11-04
  Filled 2022-10-28: qty 14, 7d supply, fill #0

## 2022-10-28 NOTE — Patient Instructions (Addendum)
Start Omnicef, antibiotic Ensure to take probiotics while on antibiotics and also for 2 weeks after completion. This can either be by eating yogurt daily or taking a probiotic supplement over the counter such as Culturelle.It is important to re-colonize the gut with good bacteria and also to prevent any diarrheal infections associated with antibiotic use.  I provided you with Cheratussin cough syrup Please take cough medication at night only as needed. As we discussed, I do not recommend dosing throughout the day as coughing is a protective mechanism . It also helps to break up thick mucous.  Do not take cough suppressants with alcohol as can lead to trouble breathing. Advise caution if taking cough suppressant and operating machinery ( i.e driving a car) as you may feel very tired.    Sinus Infection, Adult A sinus infection, also called sinusitis, is inflammation of your sinuses. Sinuses are hollow spaces in the bones around your face. Your sinuses are located: Around your eyes. In the middle of your forehead. Behind your nose. In your cheekbones. Mucus normally drains out of your sinuses. When your nasal tissues become inflamed or swollen, mucus can become trapped or blocked. This allows bacteria, viruses, and fungi to grow, which leads to infection. Most infections of the sinuses are caused by a virus. A sinus infection can develop quickly. It can last for up to 4 weeks (acute) or for more than 12 weeks (chronic). A sinus infection often develops after a cold. What are the causes? This condition is caused by anything that creates swelling in the sinuses or stops mucus from draining. This includes: Allergies. Asthma. Infection from bacteria or viruses. Deformities or blockages in your nose or sinuses. Abnormal growths in the nose (nasal polyps). Pollutants, such as chemicals or irritants in the air. Infection from fungi. This is rare. What increases the risk? You are more likely to  develop this condition if you: Have a weak body defense system (immune system). Do a lot of swimming or diving. Overuse nasal sprays. Smoke. What are the signs or symptoms? The main symptoms of this condition are pain and a feeling of pressure around the affected sinuses. Other symptoms include: Stuffy nose or congestion that makes it difficult to breathe through your nose. Thick yellow or greenish drainage from your nose. Tenderness, swelling, and warmth over the affected sinuses. A cough that may get worse at night. Decreased sense of smell and taste. Extra mucus that collects in the throat or the back of the nose (postnasal drip) causing a sore throat or bad breath. Tiredness (fatigue). Fever. How is this diagnosed? This condition is diagnosed based on: Your symptoms. Your medical history. A physical exam. Tests to find out if your condition is acute or chronic. This may include: Checking your nose for nasal polyps. Viewing your sinuses using a device that has a light (endoscope). Testing for allergies or bacteria. Imaging tests, such as an MRI or CT scan. In rare cases, a bone biopsy may be done to rule out more serious types of fungal sinus disease. How is this treated? Treatment for a sinus infection depends on the cause and whether your condition is chronic or acute. If caused by a virus, your symptoms should go away on their own within 10 days. You may be given medicines to relieve symptoms. They include: Medicines that shrink swollen nasal passages (decongestants). A spray that eases inflammation of the nostrils (topical intranasal corticosteroids). Rinses that help get rid of thick mucus in your nose (nasal saline  washes). Medicines that treat allergies (antihistamines). Over-the-counter pain relievers. If caused by bacteria, your health care provider may recommend waiting to see if your symptoms improve. Most bacterial infections will get better without antibiotic  medicine. You may be given antibiotics if you have: A severe infection. A weak immune system. If caused by narrow nasal passages or nasal polyps, surgery may be needed. Follow these instructions at home: Medicines Take, use, or apply over-the-counter and prescription medicines only as told by your health care provider. These may include nasal sprays. If you were prescribed an antibiotic medicine, take it as told by your health care provider. Do not stop taking the antibiotic even if you start to feel better. Hydrate and humidify  Drink enough fluid to keep your urine pale yellow. Staying hydrated will help to thin your mucus. Use a cool mist humidifier to keep the humidity level in your home above 50%. Inhale steam for 10-15 minutes, 3-4 times a day, or as told by your health care provider. You can do this in the bathroom while a hot shower is running. Limit your exposure to cool or dry air. Rest Rest as much as possible. Sleep with your head raised (elevated). Make sure you get enough sleep each night. General instructions  Apply a warm, moist washcloth to your face 3-4 times a day or as told by your health care provider. This will help with discomfort. Use nasal saline washes as often as told by your health care provider. Wash your hands often with soap and water to reduce your exposure to germs. If soap and water are not available, use hand sanitizer. Do not smoke. Avoid being around people who are smoking (secondhand smoke). Keep all follow-up visits. This is important. Contact a health care provider if: You have a fever. Your symptoms get worse. Your symptoms do not improve within 10 days. Get help right away if: You have a severe headache. You have persistent vomiting. You have severe pain or swelling around your face or eyes. You have vision problems. You develop confusion. Your neck is stiff. You have trouble breathing. These symptoms may be an emergency. Get help right  away. Call 911. Do not wait to see if the symptoms will go away. Do not drive yourself to the hospital. Summary A sinus infection is soreness and inflammation of your sinuses. Sinuses are hollow spaces in the bones around your face. This condition is caused by nasal tissues that become inflamed or swollen. The swelling traps or blocks the flow of mucus. This allows bacteria, viruses, and fungi to grow, which leads to infection. If you were prescribed an antibiotic medicine, take it as told by your health care provider. Do not stop taking the antibiotic even if you start to feel better. Keep all follow-up visits. This is important. This information is not intended to replace advice given to you by your health care provider. Make sure you discuss any questions you have with your health care provider. Document Revised: 09/21/2021 Document Reviewed: 09/21/2021 Elsevier Patient Education  2023 [employer].

## 2022-10-28 NOTE — Progress Notes (Signed)
Assessment & Plan:  Acute non-recurrent maxillary sinusitis Assessment & Plan: Afebrile.  Patient exposure to flu 6 days ago.  Unfortunately she is outside the window for treatment for Tamiflu.  She is also flu, covid negative today.  Discussed concern for bacterial sinusitis.  Recently on doxycycline.  Start Omnicef.  Provided her with Cheratussin and she understands not to take with alcohol.    Orders: -     Fluconazole; Take 1 tablet (150 mg total) by mouth once for 1 dose. If sxs persist, may take one tablet  3 days later.  Dispense: 2 tablet; Refill: 1  Acute cough -     POCT Influenza A/B -     POC COVID-19 BinaxNow -     guaiFENesin-Codeine; Take 5 mLs by mouth at bedtime as needed for cough.  Dispense: 70 mL; Refill: 0 -     Cefdinir; Take 1 capsule (300 mg total) by mouth 2 (two) times daily for 7 days.  Dispense: 14 capsule; Refill: 0     Return precautions given.   Risks, benefits, and alternatives of the medications and treatment plan prescribed today were discussed, and patient expressed understanding.   Education regarding symptom management and diagnosis given to patient on AVS either electronically or printed.  No follow-ups on file.  Rennie Plowman, FNP  Subjective:    Patient ID: [name], female    DOB: 03/28/1991, 31 y.o.   MRN: [id number]  CC: Glennda Makiyla Linch is a 31 y.o. female who presents today for an acute visit.    HPI: Complains of productive cough and congestion x 5 days Has been running fever x 4 days, off and on.  Tmax 101.9. She took tylenol 500mg  at 3pm today.  Endorses PND, facial pain No sob, wheezing, ear pain, diarrhea, rash No history of asthma or lung disease.  Never smoker  Doxycycline last month She has been mucinex DM without relief.  Son had flu, diagnosed 6 days ago. She tends to get yeast infection with antibiotics Tessalon Perles are not helpful for her    Allergies: Adipex-p [phentermine] and  Contrave [naltrexone-bupropion hcl er] Current Outpatient Medications on File Prior to Visit  Medication Sig Dispense Refill   benzonatate (TESSALON) 100 MG capsule Take 1-2 capsules 3 times a day as needed for cough 30 capsule 0   Cholecalciferol (VITAMIN D-3) 125 MCG (5000 UT) TABS Use as directed 1 tablet in the mouth or throat daily. 30 tablet 0   levonorgestrel (MIRENA) 20 MCG/24HR IUD 1 each by Intrauterine route once.     levothyroxine (SYNTHROID) 100 MCG tablet TAKE 1 TABLET BY MOUTH DAILY BEFORE BREAKFAST 90 tablet 3   No current facility-administered medications on file prior to visit.    Review of Systems  Constitutional:  Negative for chills and fever.  HENT:  Positive for congestion and sinus pain.   Respiratory:  Positive for cough. Negative for shortness of breath and wheezing.   Cardiovascular:  Negative for chest pain and palpitations.  Gastrointestinal:  Negative for nausea and vomiting.      Objective:    BP 130/80   Pulse (!) 120   Temp 98.7 F (37.1 C) (Oral)   Ht 5\' 2"  (1.575 m)   Wt 196 lb (88.9 kg)   LMP  (LMP Unknown)   SpO2 96%   BMI 35.85 kg/m   BP Readings from Last 3 Encounters:  10/28/22 130/80  10/27/22 106/70  11/04/21 118/74   Wt Readings from Last  3 Encounters:  10/28/22 196 lb (88.9 kg)  11/04/21 196 lb (88.9 kg)  10/13/21 198 lb 12.8 oz (90.2 kg)    Physical Exam Vitals reviewed.  Constitutional:      Appearance: She is well-developed.  HENT:     Head: Normocephalic and atraumatic.     Right Ear: Hearing, tympanic membrane, ear canal and external ear normal. No decreased hearing noted. No drainage, swelling or tenderness. No middle ear effusion. No foreign body. Tympanic membrane is not erythematous or bulging.     Left Ear: Hearing, tympanic membrane, ear canal and external ear normal. No decreased hearing noted. No drainage, swelling or tenderness.  No middle ear effusion. No foreign body. Tympanic membrane is not erythematous or  bulging.     Nose: Nose normal. No rhinorrhea.     Right Sinus: No maxillary sinus tenderness or frontal sinus tenderness.     Left Sinus: No maxillary sinus tenderness or frontal sinus tenderness.     Mouth/Throat:     Pharynx: Uvula midline. No oropharyngeal exudate or posterior oropharyngeal erythema.     Tonsils: No tonsillar abscesses.  Eyes:     Conjunctiva/sclera: Conjunctivae normal.  Cardiovascular:     Rate and Rhythm: Regular rhythm.     Pulses: Normal pulses.     Heart sounds: Normal heart sounds.  Pulmonary:     Effort: Pulmonary effort is normal.     Breath sounds: Normal breath sounds. No wheezing, rhonchi or rales.  Lymphadenopathy:     Head:     Right side of head: No submental, submandibular, tonsillar, preauricular, posterior auricular or occipital adenopathy.     Left side of head: No submental, submandibular, tonsillar, preauricular, posterior auricular or occipital adenopathy.     Cervical: No cervical adenopathy.  Skin:    General: Skin is warm and dry.  Neurological:     Mental Status: She is alert.  Psychiatric:        Speech: Speech normal.        Behavior: Behavior normal.        Thought Content: Thought content normal.

## 2022-10-28 NOTE — Assessment & Plan Note (Signed)
Afebrile.  Patient exposure to flu 6 days ago.  Unfortunately she is outside the window for treatment for Tamiflu.  She is also flu, covid negative today.  Discussed concern for bacterial sinusitis.  Recently on doxycycline.  Start Omnicef.  Provided her with Cheratussin and she understands not to take with alcohol.

## 2022-11-07 ENCOUNTER — Telehealth: Payer: Self-pay | Admitting: Internal Medicine

## 2022-11-07 ENCOUNTER — Other Ambulatory Visit: Payer: Self-pay

## 2022-11-07 DIAGNOSIS — E039 Hypothyroidism, unspecified: Secondary | ICD-10-CM

## 2022-11-08 ENCOUNTER — Other Ambulatory Visit: Payer: Self-pay

## 2022-11-08 ENCOUNTER — Other Ambulatory Visit: Payer: Self-pay | Admitting: Family

## 2022-11-08 DIAGNOSIS — E039 Hypothyroidism, unspecified: Secondary | ICD-10-CM

## 2022-11-09 ENCOUNTER — Other Ambulatory Visit: Payer: Self-pay | Admitting: Family Medicine

## 2022-11-09 ENCOUNTER — Other Ambulatory Visit: Payer: Self-pay

## 2022-11-09 DIAGNOSIS — E039 Hypothyroidism, unspecified: Secondary | ICD-10-CM

## 2022-11-09 NOTE — Telephone Encounter (Signed)
Prescription Request  11/09/2022  Is this a "Controlled Substance" medicine? No  LOV: Visit date not found  What is the name of the medication or equipment? levothyroxine (SYNTHROID) 100 MCG tablet (Expired)  Have you contacted your pharmacy to request a refill? Yes   Which pharmacy would you like this sent to?   [facility] [place] [address] Phone: [phone] Fax: [phone]    Patient notified that their request is being sent to the clinical staff for review and that they should receive a response within 2 business days.   Please advise at Mobile [phone] (mobile)   As per pt, she need to have her meds.

## 2022-11-10 ENCOUNTER — Other Ambulatory Visit: Payer: Self-pay

## 2022-11-11 ENCOUNTER — Other Ambulatory Visit: Payer: Self-pay

## 2022-11-11 MED ORDER — LEVOTHYROXINE SODIUM 100 MCG PO TABS
100.0000 ug | ORAL_TABLET | Freq: Every day | ORAL | 3 refills | Status: DC
  Filled 2022-11-11: qty 30, 30d supply, fill #0
  Filled 2022-12-20: qty 30, 30d supply, fill #1
  Filled 2023-02-02: qty 30, 30d supply, fill #2
  Filled 2023-03-19: qty 30, 30d supply, fill #3

## 2022-11-11 NOTE — Telephone Encounter (Signed)
Patient states she is following up on this medication refill.  Patient states she has been out of her levothyroxine (SYNTHROID) 100 MCG tablet (Expired) for a week and she needs this medication as soon as possible.  [facility] is her preferred pharmacy.

## 2022-11-11 NOTE — Addendum Note (Signed)
Addended by: Gracy Racer on: 11/11/2022 09:36 AM   Modules accepted: Orders

## 2022-11-11 NOTE — Telephone Encounter (Signed)
Refill has been sent pt has a TOC appt with Olivia Henderson 02/15/23

## 2022-12-13 ENCOUNTER — Telehealth: Payer: Self-pay

## 2022-12-13 NOTE — Telephone Encounter (Signed)
Left a detailed msg on pt phone in regards to her appt on tomorrow just wanted to go over meds and to get more info on the reason for the visit.

## 2022-12-14 ENCOUNTER — Telehealth: Payer: Self-pay | Admitting: Podiatry

## 2022-12-14 ENCOUNTER — Ambulatory Visit: Payer: Commercial Managed Care - PPO | Admitting: Podiatry

## 2022-12-14 ENCOUNTER — Encounter: Payer: Self-pay | Admitting: Podiatry

## 2022-12-14 ENCOUNTER — Ambulatory Visit: Payer: Commercial Managed Care - PPO | Admitting: Nurse Practitioner

## 2022-12-14 ENCOUNTER — Ambulatory Visit (INDEPENDENT_AMBULATORY_CARE_PROVIDER_SITE_OTHER): Payer: Commercial Managed Care - PPO

## 2022-12-14 ENCOUNTER — Other Ambulatory Visit: Payer: Self-pay

## 2022-12-14 DIAGNOSIS — E559 Vitamin D deficiency, unspecified: Secondary | ICD-10-CM | POA: Diagnosis not present

## 2022-12-14 DIAGNOSIS — M779 Enthesopathy, unspecified: Secondary | ICD-10-CM

## 2022-12-14 DIAGNOSIS — M84375A Stress fracture, left foot, initial encounter for fracture: Secondary | ICD-10-CM | POA: Diagnosis not present

## 2022-12-14 MED ORDER — MELOXICAM 15 MG PO TABS
15.0000 mg | ORAL_TABLET | Freq: Every day | ORAL | 3 refills | Status: DC
  Filled 2022-12-14: qty 30, 30d supply, fill #0
  Filled 2023-01-09: qty 30, 30d supply, fill #1
  Filled 2023-03-19: qty 30, 30d supply, fill #2

## 2022-12-14 NOTE — Telephone Encounter (Signed)
Pt called and was seen today and was to have been given a note for her work stating she needed to wear the boot. Please put in my chart and she can get it from there to give to work.

## 2022-12-15 ENCOUNTER — Other Ambulatory Visit
Admission: RE | Admit: 2022-12-15 | Discharge: 2022-12-15 | Disposition: A | Discharge status: Home / Self Care | Payer: Commercial Managed Care - PPO | Attending: Podiatry | Admitting: Podiatry

## 2022-12-15 DIAGNOSIS — E559 Vitamin D deficiency, unspecified: Secondary | ICD-10-CM | POA: Insufficient documentation

## 2022-12-15 LAB — CALCIUM: Calcium: 9.2 mg/dL (ref 8.9–10.3)

## 2022-12-17 LAB — MISC LABCORP TEST (SEND OUT): Labcorp test code: 81950

## 2022-12-19 ENCOUNTER — Other Ambulatory Visit: Payer: Self-pay

## 2022-12-19 MED ORDER — VITAMIN D (ERGOCALCIFEROL) 1.25 MG (50000 UNIT) PO CAPS
50000.0000 [IU] | ORAL_CAPSULE | ORAL | 0 refills | Status: DC
  Filled 2022-12-19: qty 8, 56d supply, fill #0

## 2022-12-19 NOTE — Progress Notes (Signed)
  Subjective:  Patient ID: Olivia Henderson, female    DOB: 1991-09-01,  MRN: [id number]  Chief Complaint  Patient presents with   Foot Pain    "It hurts to point my toes downward, back, and to spread them.  It hurts across the top when I that those things." N - pain on top of my foot L - dorsal pain lt D - 1 month O - gradually got worse C - throb, sharp pain A - flexing foot back and forward T - Tylenol, biofreeze    32 y.o. female presents with the above complaint. History confirmed with patient.  She returns for follow-up with a new issue there is pain across the forefoot and the lesser toes.  Does not recall direct injury.  She is on her feet a lot walking around the hospital.  She has had a history of vitamin D deficiency before, has a 5000 unit daily home supplement but has not been taking it as consistent recently.  Objective:  Physical Exam: warm, good capillary refill, no trophic changes or ulcerative lesions, normal DP and PT pulses, normal sensory exam, and pain tenderness and edema across the lesser metatarsals mostly focused around the second and third MTPJ's and metatarsal heads   Previous vitamin D level 10/11/21 28.80 nanograms per milliliter  Radiographs: Multiple views x-ray of the left foot: no fracture, dislocation, swelling or degenerative changes noted Assessment:   1. Stress fracture of metatarsal bone of left foot, initial encounter   2. Vitamin D deficiency      Plan:  Patient was evaluated and treated and all questions answered.  Today we reviewed my clinical exam findings and we reviewed her radiographs together.  Discussed with her that I think she likely is having a stress reaction of the left foot that may progress to stress fracture.  I recommend checking her vitamin D level which was low and we will increase her supplementation to 50,000 units/week.  Meloxicam sent for pain relief and anti-inflammatory effect, use and possible side effects  were reviewed.  I recommended support and immobilization in a boot, a short cam walker boot was dispensed.  May need for at least 4 weeks.  Return in 6 weeks for follow-up, MRI if worsening or not improving by that point.  Return in about 6 weeks (around 01/25/2023) for follow up stress fracture (xray if still painful).

## 2022-12-20 ENCOUNTER — Other Ambulatory Visit: Payer: Self-pay

## 2022-12-21 ENCOUNTER — Other Ambulatory Visit: Payer: Self-pay

## 2022-12-21 DIAGNOSIS — J302 Other seasonal allergic rhinitis: Secondary | ICD-10-CM | POA: Diagnosis not present

## 2022-12-21 DIAGNOSIS — J018 Other acute sinusitis: Secondary | ICD-10-CM | POA: Diagnosis not present

## 2022-12-21 DIAGNOSIS — J309 Allergic rhinitis, unspecified: Secondary | ICD-10-CM | POA: Diagnosis not present

## 2022-12-21 MED ORDER — CEFDINIR 300 MG PO CAPS
300.0000 mg | ORAL_CAPSULE | Freq: Two times a day (BID) | ORAL | 0 refills | Status: DC
  Filled 2022-12-21: qty 28, 14d supply, fill #0

## 2022-12-21 MED ORDER — PREDNISONE 10 MG PO TABS
ORAL_TABLET | ORAL | 0 refills | Status: AC
  Filled 2022-12-21: qty 48, 12d supply, fill #0

## 2023-01-31 ENCOUNTER — Other Ambulatory Visit: Payer: Self-pay

## 2023-01-31 ENCOUNTER — Telehealth: Payer: Commercial Managed Care - PPO | Admitting: Family Medicine

## 2023-01-31 DIAGNOSIS — B9689 Other specified bacterial agents as the cause of diseases classified elsewhere: Secondary | ICD-10-CM

## 2023-01-31 DIAGNOSIS — J019 Acute sinusitis, unspecified: Secondary | ICD-10-CM | POA: Diagnosis not present

## 2023-01-31 MED ORDER — DOXYCYCLINE HYCLATE 100 MG PO TABS
100.0000 mg | ORAL_TABLET | Freq: Two times a day (BID) | ORAL | 0 refills | Status: AC
  Filled 2023-01-31: qty 20, 10d supply, fill #0

## 2023-01-31 NOTE — Progress Notes (Signed)

## 2023-02-02 ENCOUNTER — Other Ambulatory Visit: Payer: Self-pay

## 2023-02-15 ENCOUNTER — Ambulatory Visit: Payer: Commercial Managed Care - PPO | Admitting: Family Medicine

## 2023-02-15 ENCOUNTER — Encounter: Payer: Self-pay | Admitting: Family Medicine

## 2023-02-15 VITALS — BP 110/76 | HR 80 | Temp 98.2°F | Resp 16 | Ht 62.0 in | Wt 200.5 lb

## 2023-02-15 DIAGNOSIS — R431 Parosmia: Secondary | ICD-10-CM

## 2023-02-15 DIAGNOSIS — E559 Vitamin D deficiency, unspecified: Secondary | ICD-10-CM | POA: Diagnosis not present

## 2023-02-15 DIAGNOSIS — E782 Mixed hyperlipidemia: Secondary | ICD-10-CM | POA: Diagnosis not present

## 2023-02-15 DIAGNOSIS — Z1159 Encounter for screening for other viral diseases: Secondary | ICD-10-CM

## 2023-02-15 DIAGNOSIS — E781 Pure hyperglyceridemia: Secondary | ICD-10-CM

## 2023-02-15 DIAGNOSIS — Z6836 Body mass index (BMI) 36.0-36.9, adult: Secondary | ICD-10-CM | POA: Diagnosis not present

## 2023-02-15 DIAGNOSIS — E039 Hypothyroidism, unspecified: Secondary | ICD-10-CM

## 2023-02-15 DIAGNOSIS — E669 Obesity, unspecified: Secondary | ICD-10-CM

## 2023-02-15 NOTE — Patient Instructions (Signed)
It was a pleasure meeting you today. Thank you for allowing me to take part in your health care.  Our goals for today as we discussed include:  Fasting blood work ordered.  Please fast for 10 hours  Pap is due.  Please schedule appointment.  If you have any questions or concerns, please do not hesitate to call the office at [phone].  I look forward to our next visit and until then take care and stay safe.  Regards,   Dana Allan, MD   [facility]

## 2023-02-15 NOTE — Progress Notes (Signed)
SUBJECTIVE:   Chief Complaint  Patient presents with   Transfer of Care   Sinus Problem    Since her last sinus infection she smell like brunt and rotten smell. Sometimes she can taste it.    HPI Patient presents to clinic to transfer care.  Chronic sinus issues Patient reports recent sinus infection and is now resolved with antibiotic treatment.  Has not been treated with for acute sinusitis with Omnicef on 12/21/2022 and with doxycycline on 01/31/2023.  She reports since last treatment at increased sense of burnt and rotten smell.  Reports a little better today but not significantly.   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  Hypothyroid Asymptomatic.  Compliant with current medication.  Takes levothyroxine 100 mcg daily.  Needs repeat TSH today.   PERTINENT PMH / PSH: Hypothyroid Obesity class II Mirena IUD for contraception  OBJECTIVE:  BP 110/76   Pulse 80   Temp 98.2 F (36.8 C)   Resp 16   Ht  (1.575 m)   Wt 200 lb 8 oz (90.9 kg)   SpO2 99%   BMI 36.67 kg/m    Physical Exam Vitals reviewed.  Constitutional:      General: She is not in acute distress.    Appearance: She is not ill-appearing.  HENT:     Head: Normocephalic.     Nose: Nose normal.  Eyes:     Conjunctiva/sclera: Conjunctivae normal.  Neck:     Thyroid: No thyromegaly or thyroid tenderness.  Cardiovascular:     Rate and Rhythm: Normal rate and regular rhythm.     Heart sounds: Normal heart sounds.  Pulmonary:     Effort: Pulmonary effort is normal.     Breath sounds: Normal breath sounds.  Abdominal:     General: Abdomen is flat. Bowel sounds are normal.     Palpations: Abdomen is soft.  Musculoskeletal:        General: Normal range of motion.     Cervical back: Normal range of motion.  Neurological:     Mental Status: She is alert and oriented to person, place, and time. Mental status is at baseline.  Psychiatric:        Mood and Affect: Mood  normal.        Behavior: Behavior normal.        Thought Content: Thought content normal.        Judgment: Judgment normal.     ASSESSMENT/PLAN:  Parosmia Assessment & Plan: New problem.  Likely secondary to sinusitis and antibiotic use. Has established relationship with ENT. Likely will self resolve however if no improvement in 3 to 4 weeks recommend he follow-up with ENT for further evaluation.   Vitamin D deficiency -     VITAMIN D 25 Hydroxy (Vit-D Deficiency, Fractures); Future  Hypertriglyceridemia -     Lipid panel; Future  Hypothyroidism, unspecified type Assessment & Plan: Chronic.  Recent TSH normal Continue levothyroxine 100 mcg's daily Check TSH level  Orders: -     TSH; Future  Class 2 severe obesity with serious comorbidity and body mass index (BMI) of 36.0 to 36.9 in adult, unspecified obesity type Assessment & Plan: Chronic. Encourage healthy nutrition and increased activity Labs today Continue future discussions.  Orders: -     Hemoglobin A1c; Future -     Comprehensive metabolic panel; Future -     CBC with Differential/Platelet; Future -     Vitamin B12;  Future  Encounter for hepatitis C screening test for low risk patient -     Hepatitis C antibody; Future  Mixed hyperlipidemia Assessment & Plan: Chronic. Not currently on treatment Encourage healthy lifestyle, increase activity Repeat fasting lipids   HCM Last Pap 04/21.  Due.  Patient to schedule Mirena IUD for contraceptive use.  Inserted 2020  PDMP reviewed  Return if symptoms worsen or fail to improve, for PCP.  Dana Allan, MD

## 2023-02-16 ENCOUNTER — Other Ambulatory Visit: Payer: Self-pay | Admitting: Family Medicine

## 2023-02-16 ENCOUNTER — Other Ambulatory Visit
Admission: RE | Admit: 2023-02-16 | Discharge: 2023-02-16 | Disposition: A | Discharge status: Home / Self Care | Payer: Commercial Managed Care - PPO | Attending: Family Medicine | Admitting: Family Medicine

## 2023-02-16 DIAGNOSIS — Z6836 Body mass index (BMI) 36.0-36.9, adult: Secondary | ICD-10-CM | POA: Diagnosis not present

## 2023-02-16 DIAGNOSIS — E781 Pure hyperglyceridemia: Secondary | ICD-10-CM | POA: Insufficient documentation

## 2023-02-16 DIAGNOSIS — E669 Obesity, unspecified: Secondary | ICD-10-CM

## 2023-02-16 DIAGNOSIS — E559 Vitamin D deficiency, unspecified: Secondary | ICD-10-CM | POA: Insufficient documentation

## 2023-02-16 DIAGNOSIS — E039 Hypothyroidism, unspecified: Secondary | ICD-10-CM | POA: Diagnosis not present

## 2023-02-16 DIAGNOSIS — Z1159 Encounter for screening for other viral diseases: Secondary | ICD-10-CM | POA: Diagnosis not present

## 2023-02-16 LAB — CBC WITH DIFFERENTIAL/PLATELET
Abs Immature Granulocytes: 0 10*3/uL (ref 0.00–0.07)
Basophils Absolute: 0 10*3/uL (ref 0.0–0.1)
Basophils Relative: 0 %
Eosinophils Absolute: 0 10*3/uL (ref 0.0–0.5)
Eosinophils Relative: 1 %
HCT: 37.2 % (ref 36.0–46.0)
Hemoglobin: 13.2 g/dL (ref 12.0–15.0)
Immature Granulocytes: 0 %
Lymphocytes Relative: 34 %
Lymphs Abs: 1.5 10*3/uL (ref 0.7–4.0)
MCH: 32.9 pg (ref 26.0–34.0)
MCHC: 35.5 g/dL (ref 30.0–36.0)
MCV: 92.8 fL (ref 80.0–100.0)
Monocytes Absolute: 0.3 10*3/uL (ref 0.1–1.0)
Monocytes Relative: 7 %
Neutro Abs: 2.6 10*3/uL (ref 1.7–7.7)
Neutrophils Relative %: 58 %
Platelets: 178 10*3/uL (ref 150–400)
RBC: 4.01 MIL/uL (ref 3.87–5.11)
RDW: 12.3 % (ref 11.5–15.5)
WBC: 4.5 10*3/uL (ref 4.0–10.5)
nRBC: 0 % (ref 0.0–0.2)

## 2023-02-16 LAB — COMPREHENSIVE METABOLIC PANEL
ALT: 28 U/L (ref 0–44)
AST: 28 U/L (ref 15–41)
Albumin: 4.2 g/dL (ref 3.5–5.0)
Alkaline Phosphatase: 81 U/L (ref 38–126)
Anion gap: 8 (ref 5–15)
BUN: 15 mg/dL (ref 6–20)
CO2: 25 mmol/L (ref 22–32)
Calcium: 9.2 mg/dL (ref 8.9–10.3)
Chloride: 106 mmol/L (ref 98–111)
Creatinine, Ser: 0.77 mg/dL (ref 0.44–1.00)
GFR, Estimated: >60 mL/min (ref >60)
Glucose, Bld: 99 mg/dL (ref 70–99)
Potassium: 4 mmol/L (ref 3.5–5.1)
Sodium: 139 mmol/L (ref 135–145)
Total Bilirubin: 1.1 mg/dL (ref 0.3–1.2)
Total Protein: 7.3 g/dL (ref 6.5–8.1)

## 2023-02-16 LAB — LIPID PANEL
Cholesterol: 175 mg/dL (ref 0–200)
HDL: 33 mg/dL — ABNORMAL LOW (ref >40)
LDL Cholesterol: 100 mg/dL — ABNORMAL HIGH (ref 0–99)
Total CHOL/HDL Ratio: 5.3 RATIO
Triglycerides: 212 mg/dL — ABNORMAL HIGH (ref <150)
VLDL: 42 mg/dL — ABNORMAL HIGH (ref 0–40)

## 2023-02-16 LAB — TSH: TSH: 3.698 u[IU]/mL (ref 0.350–4.500)

## 2023-02-16 LAB — HEMOGLOBIN A1C
Hgb A1c MFr Bld: 4.5 % — ABNORMAL LOW (ref 4.8–5.6)
Mean Plasma Glucose: 82.45 mg/dL

## 2023-02-16 LAB — VITAMIN B12: Vitamin B-12: 364 pg/mL (ref 180–914)

## 2023-02-16 LAB — HEPATITIS C ANTIBODY: HCV Ab: NONREACTIVE

## 2023-02-16 LAB — VITAMIN D 25 HYDROXY (VIT D DEFICIENCY, FRACTURES): Vit D, 25-Hydroxy: 38.4 ng/mL (ref 30–100)

## 2023-02-20 ENCOUNTER — Encounter: Payer: Self-pay | Admitting: Family Medicine

## 2023-02-20 DIAGNOSIS — Z1159 Encounter for screening for other viral diseases: Secondary | ICD-10-CM | POA: Insufficient documentation

## 2023-02-20 DIAGNOSIS — R431 Parosmia: Secondary | ICD-10-CM | POA: Insufficient documentation

## 2023-02-20 NOTE — Assessment & Plan Note (Signed)
Chronic. Not currently on treatment Encourage healthy lifestyle, increase activity Repeat fasting lipids

## 2023-02-20 NOTE — Assessment & Plan Note (Signed)
Chronic.  Recent TSH normal Continue levothyroxine 100 mcg's daily Check TSH level

## 2023-02-20 NOTE — Assessment & Plan Note (Signed)
New problem.  Likely secondary to sinusitis and antibiotic use. Has established relationship with ENT. Likely will self resolve however if no improvement in 3 to 4 weeks recommend he follow-up with ENT for further evaluation.

## 2023-02-20 NOTE — Assessment & Plan Note (Signed)
Chronic. Encourage healthy nutrition and increased activity Labs today Continue future discussions.

## 2023-03-19 ENCOUNTER — Other Ambulatory Visit: Payer: Self-pay

## 2023-04-03 ENCOUNTER — Telehealth: Payer: Commercial Managed Care - PPO | Admitting: Physician Assistant

## 2023-04-03 ENCOUNTER — Other Ambulatory Visit: Payer: Self-pay

## 2023-04-03 DIAGNOSIS — T7840XA Allergy, unspecified, initial encounter: Secondary | ICD-10-CM | POA: Diagnosis not present

## 2023-04-03 MED ORDER — PREDNISONE 10 MG PO TABS
10.0000 mg | ORAL_TABLET | ORAL | 0 refills | Status: DC
Start: 2023-04-03 — End: 2023-08-10
  Filled 2023-04-03: qty 21, 6d supply, fill #0

## 2023-04-03 NOTE — Progress Notes (Signed)
E Visit for Rash  We are sorry that you are not feeling well. Here is how we plan to help!  Based on what you shared with me it looks like you have and allergic dermatitis. Your skin may be red, swollen, dry, cracked, and itch.  The rash should go away in a few days but can last a few weeks.  If you get a rash, it's important to figure out what caused it so the irritant can be avoided in the future. and I am prescribing a course of steroids to take as directed. Ok to continue Benadryl along with this.    HOME CARE:  Take cool showers and avoid direct sunlight. Apply cool compress or wet dressings. Take a bath in an oatmeal bath.  Sprinkle content of one Aveeno packet under running faucet with comfortably warm water.  Bathe for 15-20 minutes, 1-2 times daily.  Pat dry with a towel. Do not rub the rash. Use hydrocortisone cream. Take an antihistamine like Benadryl for widespread rashes that itch.  The adult dose of Benadryl is 25-50 mg by mouth 4 times daily. Caution:  This type of medication may cause sleepiness.  Do not drink alcohol, drive, or operate dangerous machinery while taking antihistamines.  Do not take these medications if you have prostate enlargement.  Read package instructions thoroughly on all medications that you take.  GET HELP RIGHT AWAY IF:  Symptoms don't go away after treatment. Severe itching that persists. If you rash spreads or swells. If you rash begins to smell. If it blisters and opens or develops a yellow-brown crust. You develop a fever. You have a sore throat. You become short of breath.  MAKE SURE YOU:  Understand these instructions. Will watch your condition. Will get help right away if you are not doing well or get worse.  Thank you for choosing an e-visit.  Your e-visit answers were reviewed by a board certified advanced clinical practitioner to complete your personal care plan. Depending upon the condition, your plan could have included both over  the counter or prescription medications.  Please review your pharmacy choice. Make sure the pharmacy is open so you can pick up prescription now. If there is a problem, you may contact your provider through [employer] and have the prescription routed to another pharmacy.  Your safety is important to [place]. If you have drug allergies check your prescription carefully.   For the next 24 hours you can use MyChart to ask questions about today's visit, request a non-urgent call back, or ask for a work or school excuse. You will get an email in the next two days asking about your experience. I hope that your e-visit has been valuable and will speed your recovery.

## 2023-04-03 NOTE — Progress Notes (Signed)
I have spent 5 minutes in review of e-visit questionnaire, review and updating patient chart, medical decision making and response to patient.   Caelan Atchley Cody Katilynn Sinkler, PA-C    

## 2023-04-12 ENCOUNTER — Encounter: Payer: Self-pay | Admitting: Family Medicine

## 2023-04-12 DIAGNOSIS — E039 Hypothyroidism, unspecified: Secondary | ICD-10-CM

## 2023-04-14 NOTE — Telephone Encounter (Signed)
Last refilled by Worthy Rancher, NP on 11/11/2022 Last OV: 02/15/2023 Next OV: 02/19/2023

## 2023-04-15 ENCOUNTER — Other Ambulatory Visit: Payer: Self-pay

## 2023-04-15 MED ORDER — LEVOTHYROXINE SODIUM 100 MCG PO TABS
100.0000 ug | ORAL_TABLET | Freq: Every day | ORAL | 1 refills | Status: DC
Start: 2023-04-15 — End: 2023-11-08
  Filled 2023-04-15: qty 90, 90d supply, fill #0
  Filled 2023-08-14: qty 90, 90d supply, fill #1

## 2023-04-16 ENCOUNTER — Other Ambulatory Visit: Payer: Self-pay

## 2023-04-17 ENCOUNTER — Other Ambulatory Visit: Payer: Self-pay

## 2023-05-12 ENCOUNTER — Other Ambulatory Visit: Payer: Self-pay | Admitting: Oncology

## 2023-05-12 DIAGNOSIS — Z006 Encounter for examination for normal comparison and control in clinical research program: Secondary | ICD-10-CM

## 2023-05-15 ENCOUNTER — Other Ambulatory Visit
Admission: RE | Admit: 2023-05-15 | Discharge: 2023-05-15 | Disposition: A | Discharge status: Home / Self Care | Payer: Commercial Managed Care - PPO | Attending: Oncology | Admitting: Oncology

## 2023-05-15 DIAGNOSIS — Z006 Encounter for examination for normal comparison and control in clinical research program: Secondary | ICD-10-CM | POA: Insufficient documentation

## 2023-05-22 ENCOUNTER — Other Ambulatory Visit: Payer: Self-pay

## 2023-05-22 MED ORDER — AMOXICILLIN 500 MG PO CAPS
500.0000 mg | ORAL_CAPSULE | Freq: Four times a day (QID) | ORAL | 0 refills | Status: DC
  Filled 2023-05-22: qty 30, 8d supply, fill #0

## 2023-05-25 LAB — HELIX MOLECULAR SCREEN: Genetic Analysis Overall Interpretation: NEGATIVE

## 2023-05-30 ENCOUNTER — Telehealth: Payer: Commercial Managed Care - PPO | Admitting: Physician Assistant

## 2023-05-30 ENCOUNTER — Other Ambulatory Visit: Payer: Self-pay

## 2023-05-30 DIAGNOSIS — B379 Candidiasis, unspecified: Secondary | ICD-10-CM

## 2023-05-30 DIAGNOSIS — T3695XA Adverse effect of unspecified systemic antibiotic, initial encounter: Secondary | ICD-10-CM

## 2023-05-30 MED ORDER — FLUCONAZOLE 150 MG PO TABS
ORAL_TABLET | ORAL | 0 refills | Status: DC
Start: 2023-05-30 — End: 2023-07-09
  Filled 2023-05-30: qty 2, 2d supply, fill #0
  Filled 2023-05-30: qty 2, fill #0

## 2023-05-30 NOTE — Progress Notes (Signed)

## 2023-05-30 NOTE — Progress Notes (Signed)
I have spent 5 minutes in review of e-visit questionnaire, review and updating patient chart, medical decision making and response to patient.   William Cody Martin, PA-C    

## 2023-07-09 ENCOUNTER — Telehealth: Payer: Commercial Managed Care - PPO | Admitting: Physician Assistant

## 2023-07-09 DIAGNOSIS — B3731 Acute candidiasis of vulva and vagina: Secondary | ICD-10-CM | POA: Diagnosis not present

## 2023-07-09 MED ORDER — FLUCONAZOLE 150 MG PO TABS: 150.0000 mg | ORAL_TABLET | Freq: Every day | ORAL | 0 refills | Status: DC

## 2023-07-09 NOTE — Progress Notes (Signed)

## 2023-07-24 ENCOUNTER — Other Ambulatory Visit
Admission: RE | Admit: 2023-07-24 | Discharge: 2023-07-24 | Disposition: A | Discharge status: Home / Self Care | Payer: Commercial Managed Care - PPO | Source: Ambulatory Visit | Attending: Family Medicine | Admitting: Family Medicine

## 2023-07-24 DIAGNOSIS — E669 Obesity, unspecified: Secondary | ICD-10-CM | POA: Diagnosis not present

## 2023-07-24 LAB — HEMOGLOBIN A1C
Hgb A1c MFr Bld: 4.6 % — ABNORMAL LOW (ref 4.8–5.6)
Mean Plasma Glucose: 85.32 mg/dL

## 2023-07-26 ENCOUNTER — Encounter: Payer: Self-pay | Admitting: Family Medicine

## 2023-08-10 ENCOUNTER — Other Ambulatory Visit: Payer: Self-pay

## 2023-08-10 ENCOUNTER — Telehealth: Payer: Commercial Managed Care - PPO | Admitting: Physician Assistant

## 2023-08-10 DIAGNOSIS — B9789 Other viral agents as the cause of diseases classified elsewhere: Secondary | ICD-10-CM | POA: Diagnosis not present

## 2023-08-10 DIAGNOSIS — J019 Acute sinusitis, unspecified: Secondary | ICD-10-CM | POA: Diagnosis not present

## 2023-08-10 MED ORDER — IPRATROPIUM BROMIDE 0.03 % NA SOLN
2.0000 | Freq: Two times a day (BID) | NASAL | 0 refills | Status: DC
Start: 2023-08-10 — End: 2024-01-23
  Filled 2023-08-10: qty 30, 75d supply, fill #0

## 2023-08-10 NOTE — Progress Notes (Signed)

## 2023-08-10 NOTE — Progress Notes (Signed)
I have spent 5 minutes in review of e-visit questionnaire, review and updating patient chart, medical decision making and response to patient.   Mia Milan Cody Jacklynn Dehaas, PA-C    

## 2023-08-14 ENCOUNTER — Other Ambulatory Visit: Payer: Self-pay

## 2023-09-06 ENCOUNTER — Other Ambulatory Visit: Payer: Self-pay

## 2023-09-06 ENCOUNTER — Encounter: Payer: Self-pay | Admitting: Family Medicine

## 2023-09-06 ENCOUNTER — Ambulatory Visit: Payer: Commercial Managed Care - PPO | Admitting: Family Medicine

## 2023-09-06 VITALS — BP 92/70 | HR 98 | Temp 98.8°F | Ht 62.0 in | Wt 198.1 lb

## 2023-09-06 DIAGNOSIS — J208 Acute bronchitis due to other specified organisms: Secondary | ICD-10-CM

## 2023-09-06 MED ORDER — HYDROCODONE BIT-HOMATROP MBR 5-1.5 MG/5ML PO SOLN
5.0000 mL | Freq: Three times a day (TID) | ORAL | 0 refills | Status: DC | PRN
  Filled 2023-09-06: qty 120, 8d supply, fill #0

## 2023-09-06 MED ORDER — DOXYCYCLINE HYCLATE 100 MG PO TABS
100.0000 mg | ORAL_TABLET | Freq: Two times a day (BID) | ORAL | 0 refills | Status: DC
  Filled 2023-09-06: qty 20, 10d supply, fill #0

## 2023-09-06 MED ORDER — FLUCONAZOLE 150 MG PO TABS
150.0000 mg | ORAL_TABLET | Freq: Every day | ORAL | 0 refills | Status: DC
  Filled 2023-09-06: qty 2, 7d supply, fill #0

## 2023-09-06 NOTE — Progress Notes (Signed)
Ritisha Deitrick T. Royce Stegman, MD, CAQ Sports Medicine [facility] [address] [place], [address]  Phone: [phone]  FAX: [phone]  Olivia Henderson - 32 y.o. female  MRN [id number]  Date of Birth: 11-Feb-1991  Date: 09/06/2023  PCP: Dana Allan, MD  Referral: Dana Allan, MD  Chief Complaint  Patient presents with   Cough    X 2 weeks-Did E-Visit and Rx Ipratropium Bromide Works at [facility] and they did Respiratory panel and everything was negative    Nasal Congestion   Subjective:   Olivia Henderson is a 32 y.o. very pleasant female patient with Body mass index is 36.24 kg/m. who presents with the following:  She is a very pleasant young lady who is a new patient to me who presents with a persistent cough and congestion that has been present now for 3 weeks.  She did do an e-visit on August 10, 2023.  She was given some Atrovent nasal spray.  Congestion and cough Did get some atrovent nose spray Has been taking some allergy medicine -Feels very dry  No fever Was having a lot of sinus pain, but at this point this is consolidated more into the chest Prod cough Horrible cough at night -last night she coughed so much that she threw up  No lung probs, no smoker  Eating and drinking normally.  No diarrhea.  Review of Systems is noted in the HPI, as appropriate  Objective:   BP 92/70 (BP Location: Right Arm, Patient Position: Sitting, Cuff Size: Large)   Pulse 98   Temp 98.8 F (37.1 C) (Temporal)   Ht 5\' 2"  (1.575 m)   Wt 198 lb 2 oz (89.9 kg)   SpO2 99%   BMI 36.24 kg/m    Gen: WDWN, NAD. Globally Non-toxic HEENT: Throat clear, w/o exudate, R TM clear, L TM - good landmarks, No fluid present. rhinnorhea.  MMM Frontal sinuses: NT Max sinuses: NT NECK: Anterior cervical  LAD is absent CV: RRR, No M/G/R, cap refill <2 sec PULM: Breathing comfortably in no respiratory distress. no wheezing,  crackles, rhonchi   Laboratory and Imaging Data:  Assessment and Plan:     ICD-10-CM   1. Acute bronchitis due to other specified organisms  J20.8       Given the length of symptoms more than 2 to 3 weeks at this point, I am going to place her on some antibiotics to cover her for atypicals.  High risk of yeast infection with antibiotics, started on Diflucan.  I am also going to give her some Hycodan to use at nighttime for severe cough.  Medication Management during today's office visit: Meds ordered this encounter  Medications   doxycycline (VIBRA-TABS) 100 MG tablet    Sig: Take 1 tablet (100 mg total) by mouth 2 (two) times daily.    Dispense:  20 tablet    Refill:  0   fluconazole (DIFLUCAN) 150 MG tablet    Sig: Take 1 tab po today, repeat in 7 days if needed    Dispense:  2 tablet    Refill:  0   HYDROcodone bit-homatropine (HYCODAN) 5-1.5 MG/5ML syrup    Sig: Take 5 mLs by mouth every 8 (eight) hours as needed for cough.    Dispense:  120 mL    Refill:  0   Medications Discontinued During This Encounter  Medication Reason   amoxicillin (AMOXIL) 500 MG capsule Completed Course  fluconazole (DIFLUCAN) 150 MG tablet Completed Course   meloxicam (MOBIC) 15 MG tablet Completed Course    Orders placed today for conditions managed today: No orders of the defined types were placed in this encounter.   Disposition: No follow-ups on file.  [employer] speech-to-text software was used for transcription in this dictation.  Possible transcriptional errors can occur using Animal nutritionist.   Signed,  Elpidio Galea. Todd Argabright, MD   Outpatient Encounter Medications as of 09/06/2023  Medication Sig   Cholecalciferol (VITAMIN D-3) 125 MCG (5000 UT) TABS Use as directed 1 tablet in the mouth or throat daily.   doxycycline (VIBRA-TABS) 100 MG tablet Take 1 tablet (100 mg total) by mouth 2 (two) times daily.   fluconazole (DIFLUCAN) 150 MG tablet Take 1 tab po today, repeat  in 7 days if needed   HYDROcodone bit-homatropine (HYCODAN) 5-1.5 MG/5ML syrup Take 5 mLs by mouth every 8 (eight) hours as needed for cough.   ipratropium (ATROVENT) 0.03 % nasal spray Place 2 sprays into both nostrils every 12 (twelve) hours.   levonorgestrel (MIRENA) 20 MCG/24HR IUD 1 each by Intrauterine route once.   levothyroxine (SYNTHROID) 100 MCG tablet Take 1 tablet (100 mcg total) by mouth daily before breakfast.   [DISCONTINUED] meloxicam (MOBIC) 15 MG tablet Take 1 tablet (15 mg total) by mouth daily.   [DISCONTINUED] amoxicillin (AMOXIL) 500 MG capsule Take 1 capsule (500 mg total) by mouth every 6 (six) hours until complete.   [DISCONTINUED] fluconazole (DIFLUCAN) 150 MG tablet Take 1 tablet (150 mg total) by mouth daily.   No facility-administered encounter medications on file as of 09/06/2023.

## 2023-11-02 ENCOUNTER — Telehealth: Payer: Commercial Managed Care - PPO | Admitting: Nurse Practitioner

## 2023-11-02 ENCOUNTER — Other Ambulatory Visit: Payer: Self-pay

## 2023-11-02 DIAGNOSIS — J0140 Acute pansinusitis, unspecified: Secondary | ICD-10-CM | POA: Diagnosis not present

## 2023-11-02 MED ORDER — DOXYCYCLINE HYCLATE 100 MG PO TABS
100.0000 mg | ORAL_TABLET | Freq: Two times a day (BID) | ORAL | 0 refills | Status: AC
Start: 2023-11-02 — End: 2023-11-09
  Filled 2023-11-02: qty 14, 7d supply, fill #0

## 2023-11-02 MED ORDER — AMOXICILLIN-POT CLAVULANATE 875-125 MG PO TABS
1.0000 | ORAL_TABLET | Freq: Two times a day (BID) | ORAL | 0 refills | Status: DC
Start: 2023-11-02 — End: 2023-11-02
  Filled 2023-11-02: qty 14, 7d supply, fill #0

## 2023-11-02 NOTE — Addendum Note (Signed)
 Addended by: Viviano Simas E on: 11/02/2023 01:52 PM   Modules accepted: Orders

## 2023-11-02 NOTE — Progress Notes (Signed)
E-Visit for Sinus Problems  We are sorry that you are not feeling well.  Here is how we plan to help!  Based on what you have shared with me it looks like you have sinusitis.  Sinusitis is inflammation and infection in the sinus cavities of the head.  Based on your presentation I believe you most likely have Acute Bacterial Sinusitis.  This is an infection caused by bacteria and is treated with antibiotics. I have prescribed Augmentin 875mg/125mg one tablet twice daily with food, for 7 days.  You may use an oral decongestant such as Mucinex D or if you have glaucoma or high blood pressure use plain Mucinex. Saline nasal spray help and can safely be used as often as needed for congestion.  If you develop worsening sinus pain, fever or notice severe headache and vision changes, or if symptoms are not better after completion of antibiotic, please schedule an appointment with a health care provider.    Sinus infections are not as easily transmitted as other respiratory infection, however we still recommend that you avoid close contact with loved ones, especially the very young and elderly.  Remember to wash your hands thoroughly throughout the day as this is the number one way to prevent the spread of infection!  Home Care: Only take medications as instructed by your medical team. Complete the entire course of an antibiotic. Do not take these medications with alcohol. A steam or ultrasonic humidifier can help congestion.  You can place a towel over your head and breathe in the steam from hot water coming from a faucet. Avoid close contacts especially the very young and the elderly. Cover your mouth when you cough or sneeze. Always remember to wash your hands.  Get Help Right Away If: You develop worsening fever or sinus pain. You develop a severe head ache or visual changes. Your symptoms persist after you have completed your treatment plan.  Make sure you Understand these instructions. Will  watch your condition. Will get help right away if you are not doing well or get worse.  Thank you for choosing an e-visit.  Your e-visit answers were reviewed by a board certified advanced clinical practitioner to complete your personal care plan. Depending upon the condition, your plan could have included both over the counter or prescription medications.  Please review your pharmacy choice. Make sure the pharmacy is open so you can pick up prescription now. If there is a problem, you may contact your provider through MyChart messaging and have the prescription routed to another pharmacy.  Your safety is important to us. If you have drug allergies check your prescription carefully.   For the next 24 hours you can use MyChart to ask questions about today's visit, request a non-urgent call back, or ask for a work or school excuse. You will get an email in the next two days asking about your experience. I hope that your e-visit has been valuable and will speed your recovery.   Meds ordered this encounter  Medications   amoxicillin-clavulanate (AUGMENTIN) 875-125 MG tablet    Sig: Take 1 tablet by mouth 2 (two) times daily for 7 days.    Dispense:  14 tablet    Refill:  0     I spent approximately 5 minutes reviewing the patient's history, current symptoms and coordinating their care today.   

## 2023-11-08 ENCOUNTER — Other Ambulatory Visit: Payer: Self-pay | Admitting: Family Medicine

## 2023-11-08 ENCOUNTER — Other Ambulatory Visit: Payer: Self-pay

## 2023-11-08 ENCOUNTER — Telehealth: Payer: Commercial Managed Care - PPO | Admitting: Physician Assistant

## 2023-11-08 DIAGNOSIS — B379 Candidiasis, unspecified: Secondary | ICD-10-CM

## 2023-11-08 DIAGNOSIS — E039 Hypothyroidism, unspecified: Secondary | ICD-10-CM

## 2023-11-08 MED ORDER — FLUCONAZOLE 150 MG PO TABS
ORAL_TABLET | ORAL | 0 refills | Status: DC
Start: 2023-11-08 — End: 2023-12-19
  Filled 2023-11-08: qty 2, 7d supply, fill #0

## 2023-11-08 MED ORDER — LEVOTHYROXINE SODIUM 100 MCG PO TABS
100.0000 ug | ORAL_TABLET | Freq: Every day | ORAL | 1 refills | Status: DC
Start: 2023-11-08 — End: 2024-02-19
  Filled 2023-11-08 – 2023-11-29 (×2): qty 90, 90d supply, fill #0

## 2023-11-08 NOTE — Progress Notes (Signed)

## 2023-11-08 NOTE — Progress Notes (Signed)
 I have spent 5 minutes in review of e-visit questionnaire, review and updating patient chart, medical decision making and response to patient.   Piedad Climes, PA-C

## 2023-11-21 ENCOUNTER — Other Ambulatory Visit: Payer: Self-pay

## 2023-11-29 ENCOUNTER — Other Ambulatory Visit: Payer: Self-pay

## 2023-12-19 ENCOUNTER — Other Ambulatory Visit: Payer: Self-pay

## 2023-12-19 ENCOUNTER — Telehealth: Payer: Commercial Managed Care - PPO | Admitting: Physician Assistant

## 2023-12-19 DIAGNOSIS — T3695XA Adverse effect of unspecified systemic antibiotic, initial encounter: Secondary | ICD-10-CM | POA: Diagnosis not present

## 2023-12-19 DIAGNOSIS — B379 Candidiasis, unspecified: Secondary | ICD-10-CM

## 2023-12-19 MED ORDER — FLUCONAZOLE 150 MG PO TABS
ORAL_TABLET | ORAL | 0 refills | Status: DC
Start: 2023-12-19 — End: 2024-01-23
  Filled 2023-12-19: qty 2, 3d supply, fill #0

## 2023-12-19 NOTE — Progress Notes (Signed)

## 2023-12-19 NOTE — Progress Notes (Signed)
 I have spent 5 minutes in review of e-visit questionnaire, review and updating patient chart, medical decision making and response to patient.   Piedad Climes, PA-C

## 2024-01-23 ENCOUNTER — Ambulatory Visit (INDEPENDENT_AMBULATORY_CARE_PROVIDER_SITE_OTHER): Admitting: Obstetrics and Gynecology

## 2024-01-23 ENCOUNTER — Encounter: Payer: Self-pay | Admitting: Obstetrics and Gynecology

## 2024-01-23 ENCOUNTER — Other Ambulatory Visit (HOSPITAL_COMMUNITY)
Admission: RE | Admit: 2024-01-23 | Discharge: 2024-01-23 | Disposition: A | Discharge status: Home / Self Care | Source: Ambulatory Visit | Attending: Obstetrics and Gynecology | Admitting: Obstetrics and Gynecology

## 2024-01-23 DIAGNOSIS — Z01419 Encounter for gynecological examination (general) (routine) without abnormal findings: Secondary | ICD-10-CM

## 2024-01-23 DIAGNOSIS — Z124 Encounter for screening for malignant neoplasm of cervix: Secondary | ICD-10-CM | POA: Diagnosis not present

## 2024-01-23 DIAGNOSIS — Z30431 Encounter for routine checking of intrauterine contraceptive device: Secondary | ICD-10-CM

## 2024-01-23 NOTE — Progress Notes (Signed)
 HPI:      [name] is a 33 y.o. [phone] who LMP was No LMP recorded. (Menstrual status: IUD).  Subjective:   She presents today for her annual examination.  She has no complaints.  She has a Mirena IUD in place and has occasional spotting/menstrual periods.  These are not an issue for her.  She is due for a Pap smear.    Hx: The following portions of the patient's history were reviewed and updated as appropriate:             She  has a past medical history of Anemia, Chicken pox, Complication of anesthesia, COVID-19, GERD (gastroesophageal reflux disease), Preeclampsia, and Unspecified hypothyroidism (07/10/2014). She does not have any pertinent problems on file. She  has a past surgical history that includes Cesarean section (2014) and Wisdom tooth extraction. Her family history includes Diabetes in her maternal grandfather; Healthy in her mother; Heart disease in her maternal grandmother; Hypertension in her father; Kidney cancer in her maternal grandmother. She  reports that she has never smoked. She has never used smokeless tobacco. She reports that she does not drink alcohol and does not use drugs. She has a current medication list which includes the following prescription(s): vitamin d-3, levonorgestrel, and levothyroxine. She is allergic to adipex-p [phentermine] and contrave [naltrexone-bupropion hcl er].       Review of Systems:  Review of Systems  Constitutional: Denied constitutional symptoms, night sweats, recent illness, fatigue, fever, insomnia and weight loss.  Eyes: Denied eye symptoms, eye pain, photophobia, vision change and visual disturbance.  Ears/Nose/Throat/Neck: Denied ear, nose, throat or neck symptoms, hearing loss, nasal discharge, sinus congestion and sore throat.  Cardiovascular: Denied cardiovascular symptoms, arrhythmia, chest pain/pressure, edema, exercise intolerance, orthopnea and palpitations.  Respiratory: Denied pulmonary symptoms,  asthma, pleuritic pain, productive sputum, cough, dyspnea and wheezing.  Gastrointestinal: Denied, gastro-esophageal reflux, melena, nausea and vomiting.  Genitourinary: Denied genitourinary symptoms including symptomatic vaginal discharge, pelvic relaxation issues, and urinary complaints.  Musculoskeletal: Denied musculoskeletal symptoms, stiffness, swelling, muscle weakness and myalgia.  Dermatologic: Denied dermatology symptoms, rash and scar.  Neurologic: Denied neurology symptoms, dizziness, headache, neck pain and syncope.  Psychiatric: Denied psychiatric symptoms, anxiety and depression.  Endocrine: Denied endocrine symptoms including hot flashes and night sweats.   Meds:   Current Outpatient Medications on File Prior to Visit  Medication Sig Dispense Refill   Cholecalciferol (VITAMIN D-3) 125 MCG (5000 UT) TABS Use as directed 1 tablet in the mouth or throat daily. 30 tablet 0   levonorgestrel (MIRENA) 20 MCG/24HR IUD 1 each by Intrauterine route once.     levothyroxine (SYNTHROID) 100 MCG tablet Take 1 tablet (100 mcg total) by mouth daily before breakfast. 90 tablet 1   No current facility-administered medications on file prior to visit.     Objective:     There were no vitals filed for this visit.  There were no vitals filed for this visit.            Physical examination General NAD, Conversant  HEENT Atraumatic; Op clear with mmm.  Normo-cephalic.  Anicteric sclerae  Thyroid/Neck Smooth without nodularity or enlargement. Normal ROM.  Neck Supple.  Skin No rashes, lesions or ulceration. Normal palpated skin turgor. No nodularity.  Breasts: No masses or discharge.  Symmetric.  No axillary adenopathy.  Lungs: Clear to auscultation.No rales or wheezes. Normal Respiratory effort, no retractions.  Heart: NSR.  No murmurs or rubs appreciated. No peripheral edema  Abdomen: Soft.  Non-tender.  No masses.  No HSM. No hernia  Extremities: Moves all appropriately.  Normal ROM for  age. No lymphadenopathy.  Neuro: Oriented to PPT.  Normal mood. Normal affect.     Pelvic:   Vulva: Normal appearance.  No lesions.  Vagina: No lesions or abnormalities noted.  Support: Normal pelvic support.  Urethra No masses tenderness or scarring.  Meatus Normal size without lesions or prolapse.  Cervix: Normal appearance.  No lesions.  IUD strings noted  Anus: Normal exam.  No lesions.  Perineum: Normal exam.  No lesions.        Bimanual   Uterus: Normal size.  Non-tender.  Mobile.  AV.  Adnexae: No masses.  Non-tender to palpation.  Cul-de-sac: Negative for abnormality.     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 Well woman exam with routine gynecological exam   2. Cervical cancer screening   3. Encounter for routine checking of intrauterine contraceptive device (IUD)     Normal exam   Plan:            1.  Basic Screening Recommendations The basic screening recommendations for asymptomatic women were discussed with the patient during her visit.  The age-appropriate recommendations were discussed with her and the rational for the tests reviewed.  When I am informed by the patient that another primary care physician has previously obtained the age-appropriate tests and they are up-to-date, only outstanding tests are ordered and referrals given as necessary.  Abnormal results of tests will be  discussed with her when all of her results are completed.  Routine preventative health maintenance measures emphasized: Exercise/Diet/Weight control, Tobacco Warnings, Alcohol/Substance use risks and Stress Management Pap performed  Orders No orders of the defined types were placed in this encounter.   No orders of the defined types were placed in this encounter.         F/U  No follow-ups on file.  Elonda Husky, M.D. 01/23/2024 10:40 AM

## 2024-01-23 NOTE — Progress Notes (Signed)
 Patients presents for annual exam today. She states doing well with current IUD, occasional spotting. Due for pap smear, ordered. She states no other questions or concerns at this time.

## 2024-01-24 LAB — CYTOLOGY - PAP
Comment: NEGATIVE
Diagnosis: NEGATIVE
High risk HPV: NEGATIVE

## 2024-02-19 ENCOUNTER — Other Ambulatory Visit: Payer: Self-pay

## 2024-02-19 ENCOUNTER — Ambulatory Visit: Payer: Commercial Managed Care - PPO | Admitting: Family Medicine

## 2024-02-19 ENCOUNTER — Encounter: Payer: Self-pay | Admitting: Family Medicine

## 2024-02-19 VITALS — BP 98/68 | HR 99 | Temp 98.5°F | Resp 20 | Ht 62.0 in | Wt 199.1 lb

## 2024-02-19 DIAGNOSIS — E559 Vitamin D deficiency, unspecified: Secondary | ICD-10-CM

## 2024-02-19 DIAGNOSIS — E039 Hypothyroidism, unspecified: Secondary | ICD-10-CM

## 2024-02-19 DIAGNOSIS — Z0000 Encounter for general adult medical examination without abnormal findings: Secondary | ICD-10-CM | POA: Diagnosis not present

## 2024-02-19 DIAGNOSIS — E538 Deficiency of other specified B group vitamins: Secondary | ICD-10-CM

## 2024-02-19 DIAGNOSIS — E782 Mixed hyperlipidemia: Secondary | ICD-10-CM

## 2024-02-19 DIAGNOSIS — E66812 Obesity, class 2: Secondary | ICD-10-CM

## 2024-02-19 DIAGNOSIS — K219 Gastro-esophageal reflux disease without esophagitis: Secondary | ICD-10-CM | POA: Diagnosis not present

## 2024-02-19 DIAGNOSIS — E669 Obesity, unspecified: Secondary | ICD-10-CM

## 2024-02-19 DIAGNOSIS — R7309 Other abnormal glucose: Secondary | ICD-10-CM

## 2024-02-19 DIAGNOSIS — Z6836 Body mass index (BMI) 36.0-36.9, adult: Secondary | ICD-10-CM | POA: Diagnosis not present

## 2024-02-19 MED ORDER — PANTOPRAZOLE SODIUM 40 MG PO TBEC
40.0000 mg | DELAYED_RELEASE_TABLET | Freq: Every day | ORAL | 0 refills | Status: DC
Start: 2024-02-19 — End: 2024-05-28
  Filled 2024-02-19: qty 30, 30d supply, fill #0

## 2024-02-19 MED ORDER — LEVOTHYROXINE SODIUM 100 MCG PO TABS
100.0000 ug | ORAL_TABLET | Freq: Every day | ORAL | 1 refills | Status: AC
Start: 2024-02-19 — End: ?
  Filled 2024-02-19 – 2024-09-22 (×5): qty 90, 90d supply, fill #0

## 2024-02-19 NOTE — Patient Instructions (Addendum)
 It was a pleasure meeting you today. Thank you for allowing me to take part in your health care.  Our goals for today as we discussed include:  Start Protonix  40 mg daily for heartburn  Schedule lab appointment.  Fast for 10 hours  Refills sent for Levothyroxine . If needing change in dose will send in script.   This is a list of the screening recommended for you and due dates:  Health Maintenance  Topic Date Due   COVID-19 Vaccine (3 - 2024-25 season) 07/02/2023   Flu Shot  05/31/2024   Pap with HPV screening  01/22/2029   DTaP/Tdap/Td vaccine (2 - Td or Tdap) 07/02/2029   Hepatitis C Screening  Completed   HIV Screening  Completed   HPV Vaccine  Aged Out   Meningitis B Vaccine  Aged Out      If you have any questions or concerns, please do not hesitate to call the office at [phone].  I look forward to our next visit and until then take care and stay safe.  Regards,   Valli Gaw, MD   [facility]

## 2024-02-19 NOTE — Progress Notes (Signed)
 SUBJECTIVE:   Chief Complaint  Patient presents with   Annual Exam   Gastroesophageal Reflux    Wants to talk about medication nothing OTC is working   HPI Presents for annual physical  Discussed the use of AI scribe software for clinical note transcription with the patient, who gave verbal consent to proceed.  History of Present Illness Olivia Henderson is a 33 year old female who presents for an annual physical exam.  She has a history of GERD, experiencing bloating and burping with a 'sulfury' smell, particularly after consuming certain foods like boiled eggs. These symptoms occur at least once or twice a month. No abdominal pain, nausea, vomiting, or changes in stool pattern. She has tried Prilosec and Tums with limited relief.  Her work schedule is from 6 AM to 2 PM, and she tries to eat early to avoid lying down after meals. She consumes caffeine daily, typically one cup of coffee in the morning. No recent weight gain and reports regular bowel movements once or twice a day.  She has a history of low A1c levels, previously recorded at 4.4 and 4.6. She experiences shakiness and dizziness if she skips meals, necessitating three meals a day. She is concerned about her thyroid  function, as it has not been checked recently.  She is currently taking Synthroid  and has about half a bottle left. Her blood pressure is typically low, around 90/60, and her heart rate is 99. No chest pain, shortness of breath, or leg swelling.  Her menstrual periods are sporadic, and she has an IUD in place, which is due for removal in three years. She occasionally consumes alcohol and denies smoking. She uses NSAIDs like ibuprofen  only for headaches.    PERTINENT PMH / PSH: As above  OBJECTIVE:  BP 98/68   Pulse 99   Temp 98.5 F (36.9 C)   Resp 20   Ht 5\' 2"  (1.575 m)   Wt 199 lb 2 oz (90.3 kg)   SpO2 99%   BMI 36.42 kg/m    Physical Exam Vitals reviewed.  Constitutional:       General: She is not in acute distress.    Appearance: She is obese. She is not ill-appearing.  HENT:     Head: Normocephalic.     Right Ear: Tympanic membrane, ear canal and external ear normal.     Left Ear: Tympanic membrane, ear canal and external ear normal.     Nose: Nose normal.     Mouth/Throat:     Mouth: Mucous membranes are moist.  Eyes:     Extraocular Movements: Extraocular movements intact.     Conjunctiva/sclera: Conjunctivae normal.     Pupils: Pupils are equal, round, and reactive to light.  Neck:     Vascular: No carotid bruit.  Cardiovascular:     Rate and Rhythm: Normal rate and regular rhythm.     Pulses: Normal pulses.     Heart sounds: Normal heart sounds.  Pulmonary:     Effort: Pulmonary effort is normal.     Breath sounds: Normal breath sounds.  Abdominal:     General: Bowel sounds are normal. There is no distension.     Palpations: Abdomen is soft.     Tenderness: There is no abdominal tenderness. There is no right CVA tenderness, left CVA tenderness, guarding or rebound.  Musculoskeletal:        General: Normal range of motion.     Cervical back: Normal range of motion.  Right lower leg: No edema.     Left lower leg: No edema.  Lymphadenopathy:     Cervical: No cervical adenopathy.  Skin:    Capillary Refill: Capillary refill takes less than 2 seconds.  Neurological:     General: No focal deficit present.     Mental Status: She is alert and oriented to person, place, and time. Mental status is at baseline.     Motor: No weakness.  Psychiatric:        Mood and Affect: Mood normal.        Behavior: Behavior normal.        Thought Content: Thought content normal.        Judgment: Judgment normal.           02/19/2024    2:48 PM 02/15/2023    1:02 PM 10/28/2022    4:12 PM 09/13/2022    4:24 PM 06/29/2021    8:48 AM  Depression screen PHQ 2/9  Decreased Interest 1 0 0 0 0  Down, Depressed, Hopeless 1 0 0 0 0  PHQ - 2 Score 2 0 0 0 0   Altered sleeping 2 0     Tired, decreased energy 3 1     Change in appetite 1 0     Feeling bad or failure about yourself  0 0     Trouble concentrating 1 0     Moving slowly or fidgety/restless 1 0     Suicidal thoughts 0 0     PHQ-9 Score 10 1     Difficult doing work/chores Somewhat difficult Not difficult at all         02/19/2024    2:49 PM 02/15/2023    1:03 PM 08/12/2020    3:31 PM  GAD 7 : Generalized Anxiety Score  Nervous, Anxious, on Edge 1 0 0  Control/stop worrying 1 0 0  Worry too much - different things 1 0 0  Trouble relaxing 1 0 0  Restless 0 0 0  Easily annoyed or irritable 1 0 0  Afraid - awful might happen 0 0 0  Total GAD 7 Score 5 0 0  Anxiety Difficulty Somewhat difficult Not difficult at all Not difficult at all    ASSESSMENT/PLAN:  Annual physical exam Assessment & Plan: Routine wellness examination. Discussed self-breast exams and body awareness. Mammograms to start at age 61. Colonoscopy screening between ages 37 and 56. All vaccines up to date except for COVID boosters. - Recommend regular self-breast exams. - Schedule mammograms starting at age 81. - Plan colonoscopy screening between ages 30 and 18. - Ensure all vaccines are up to date, consider COVID booster if needed.   Gastroesophageal reflux disease, unspecified whether esophagitis present Assessment & Plan: Long-standing GERD with bloating and sulfurous burping. Previous treatments ineffective. Discussed dietary triggers. Protonix  prescribed. No current need for GI referral unless symptoms worsen. - Restart Protonix  40 mg daily for 30 days. - Advise keeping a food journal to identify dietary triggers. - Discuss dietary modifications, including reducing caffeine and carbohydrate intake. - Consider GI referral if symptoms do not improve or worsen.  Orders: -     Pantoprazole  Sodium; Take 1 tablet (40 mg total) by mouth daily.  Dispense: 30 tablet; Refill: 0  Vitamin D   deficiency Assessment & Plan: Check Vitamin D  level   Mixed hyperlipidemia -     Comprehensive metabolic panel with GFR  Hypothyroidism, unspecified type Assessment & Plan: Managed with Synthroid . TSH not checked  recently. Refills provided for continuity of care. - Order TSH test. - Refill Levothyroxine  100 mcg daily  Orders: -     Levothyroxine  Sodium; Take 1 tablet (100 mcg total) by mouth daily before breakfast.  Dispense: 90 tablet; Refill: 1  Abnormal glucose -     Fructosamine  Vitamin B 12 deficiency Assessment & Plan: Check vitamin B 12 level   Class 2 severe obesity with serious comorbidity and body mass index (BMI) of 36.0 to 36.9 in adult, unspecified obesity type [facility]) Assessment & Plan: Chronic. Encourage healthy nutrition and increased activity            PDMP reviewed  No follow-ups on file.  Valli Gaw, MD

## 2024-02-20 ENCOUNTER — Other Ambulatory Visit
Admission: RE | Admit: 2024-02-20 | Discharge: 2024-02-20 | Disposition: A | Discharge status: Home / Self Care | Attending: Family Medicine | Admitting: Family Medicine

## 2024-02-20 DIAGNOSIS — E538 Deficiency of other specified B group vitamins: Secondary | ICD-10-CM | POA: Diagnosis not present

## 2024-02-20 DIAGNOSIS — E559 Vitamin D deficiency, unspecified: Secondary | ICD-10-CM | POA: Diagnosis not present

## 2024-02-20 DIAGNOSIS — R7309 Other abnormal glucose: Secondary | ICD-10-CM | POA: Insufficient documentation

## 2024-02-20 DIAGNOSIS — E782 Mixed hyperlipidemia: Secondary | ICD-10-CM | POA: Diagnosis not present

## 2024-02-20 DIAGNOSIS — E039 Hypothyroidism, unspecified: Secondary | ICD-10-CM | POA: Diagnosis not present

## 2024-02-20 LAB — COMPREHENSIVE METABOLIC PANEL WITH GFR
ALT: 32 U/L (ref 0–44)
AST: 28 U/L (ref 15–41)
Albumin: 4.4 g/dL (ref 3.5–5.0)
Alkaline Phosphatase: 74 U/L (ref 38–126)
Anion gap: 9 (ref 5–15)
BUN: 12 mg/dL (ref 6–20)
CO2: 23 mmol/L (ref 22–32)
Calcium: 9.5 mg/dL (ref 8.9–10.3)
Chloride: 105 mmol/L (ref 98–111)
Creatinine, Ser: 0.84 mg/dL (ref 0.44–1.00)
GFR, Estimated: >60 mL/min (ref >60)
Glucose, Bld: 96 mg/dL (ref 70–99)
Potassium: 3.9 mmol/L (ref 3.5–5.1)
Sodium: 137 mmol/L (ref 135–145)
Total Bilirubin: 1.1 mg/dL (ref 0.0–1.2)
Total Protein: 7.4 g/dL (ref 6.5–8.1)

## 2024-02-20 LAB — VITAMIN B12: Vitamin B-12: 377 pg/mL (ref 180–914)

## 2024-02-20 LAB — HEMOGLOBIN A1C
Hgb A1c MFr Bld: 4.1 % — ABNORMAL LOW (ref 4.8–5.6)
Mean Plasma Glucose: 70.97 mg/dL

## 2024-02-20 LAB — LIPID PANEL
Cholesterol: 170 mg/dL (ref 0–200)
HDL: 34 mg/dL — ABNORMAL LOW (ref >40)
LDL Cholesterol: 94 mg/dL (ref 0–99)
Total CHOL/HDL Ratio: 5 ratio
Triglycerides: 211 mg/dL — ABNORMAL HIGH (ref <150)
VLDL: 42 mg/dL — ABNORMAL HIGH (ref 0–40)

## 2024-02-20 LAB — TSH: TSH: 3.492 u[IU]/mL (ref 0.350–4.500)

## 2024-02-20 LAB — VITAMIN D 25 HYDROXY (VIT D DEFICIENCY, FRACTURES): Vit D, 25-Hydroxy: 25.07 ng/mL — ABNORMAL LOW (ref 30–100)

## 2024-02-22 LAB — MISC LABCORP TEST (SEND OUT): Labcorp test code: 100800

## 2024-02-25 ENCOUNTER — Other Ambulatory Visit: Payer: Self-pay

## 2024-02-25 ENCOUNTER — Encounter: Payer: Self-pay | Admitting: Family Medicine

## 2024-02-25 MED ORDER — VITAMIN D (ERGOCALCIFEROL) 1.25 MG (50000 UNIT) PO CAPS
50000.0000 [IU] | ORAL_CAPSULE | ORAL | 1 refills | Status: AC
Start: 2024-02-25 — End: ?
  Filled 2024-02-25: qty 12, 84d supply, fill #0
  Filled 2024-05-15: qty 12, 84d supply, fill #1

## 2024-02-25 NOTE — Assessment & Plan Note (Signed)
 Managed with Synthroid . TSH not checked recently. Refills provided for continuity of care. - Order TSH test. - Refill Levothyroxine  100 mcg daily

## 2024-02-25 NOTE — Assessment & Plan Note (Signed)
Check vitamin B12 level

## 2024-02-25 NOTE — Assessment & Plan Note (Signed)
 Routine wellness examination. Discussed self-breast exams and body awareness. Mammograms to start at age 34. Colonoscopy screening between ages 66 and 18. All vaccines up to date except for COVID boosters. - Recommend regular self-breast exams. - Schedule mammograms starting at age 4. - Plan colonoscopy screening between ages 38 and 22. - Ensure all vaccines are up to date, consider COVID booster if needed.

## 2024-02-25 NOTE — Assessment & Plan Note (Signed)
 Long-standing GERD with bloating and sulfurous burping. Previous treatments ineffective. Discussed dietary triggers. Protonix  prescribed. No current need for GI referral unless symptoms worsen. - Restart Protonix  40 mg daily for 30 days. - Advise keeping a food journal to identify dietary triggers. - Discuss dietary modifications, including reducing caffeine and carbohydrate intake. - Consider GI referral if symptoms do not improve or worsen.

## 2024-02-25 NOTE — Assessment & Plan Note (Signed)
 Check Vitamin D level

## 2024-02-25 NOTE — Assessment & Plan Note (Signed)
 Chronic. Encourage healthy nutrition and increased activity

## 2024-02-26 ENCOUNTER — Telehealth: Payer: Self-pay

## 2024-02-26 NOTE — Telephone Encounter (Signed)
 Noted.

## 2024-02-26 NOTE — Telephone Encounter (Signed)
 Copied from CRM [id number]. Topic: Clinical - Lab/Test Results >> Feb 26, 2024 11:39 AM Elle L wrote: Reason for CRM: The patient returned the call regarding her lab work. I read the note to her verbatim and she expressed understanding.

## 2024-03-27 ENCOUNTER — Telehealth: Admitting: Physician Assistant

## 2024-03-27 DIAGNOSIS — J019 Acute sinusitis, unspecified: Secondary | ICD-10-CM | POA: Diagnosis not present

## 2024-03-27 DIAGNOSIS — B9689 Other specified bacterial agents as the cause of diseases classified elsewhere: Secondary | ICD-10-CM

## 2024-03-27 MED ORDER — AMOXICILLIN-POT CLAVULANATE 875-125 MG PO TABS
1.0000 | ORAL_TABLET | Freq: Two times a day (BID) | ORAL | 0 refills | Status: DC
  Filled 2024-03-27: qty 14, 7d supply, fill #0

## 2024-03-27 NOTE — Progress Notes (Signed)

## 2024-03-27 NOTE — Progress Notes (Signed)
 I have spent 5 minutes in review of e-visit questionnaire, review and updating patient chart, medical decision making and response to patient.   Piedad Climes, PA-C

## 2024-03-28 ENCOUNTER — Other Ambulatory Visit: Payer: Self-pay

## 2024-04-12 ENCOUNTER — Telehealth: Admitting: Physician Assistant

## 2024-04-12 ENCOUNTER — Other Ambulatory Visit: Payer: Self-pay

## 2024-04-12 DIAGNOSIS — B379 Candidiasis, unspecified: Secondary | ICD-10-CM | POA: Diagnosis not present

## 2024-04-12 DIAGNOSIS — T3695XA Adverse effect of unspecified systemic antibiotic, initial encounter: Secondary | ICD-10-CM

## 2024-04-12 MED ORDER — FLUCONAZOLE 150 MG PO TABS
150.0000 mg | ORAL_TABLET | ORAL | 0 refills | Status: DC | PRN
  Filled 2024-04-12: qty 2, 6d supply, fill #0

## 2024-04-12 NOTE — Progress Notes (Signed)

## 2024-04-22 ENCOUNTER — Telehealth: Admitting: Physician Assistant

## 2024-04-22 ENCOUNTER — Other Ambulatory Visit: Payer: Self-pay

## 2024-04-22 DIAGNOSIS — H109 Unspecified conjunctivitis: Secondary | ICD-10-CM

## 2024-04-22 MED ORDER — OFLOXACIN 0.3 % OP SOLN
1.0000 [drp] | Freq: Four times a day (QID) | OPHTHALMIC | 0 refills | Status: AC
  Filled 2024-04-22: qty 5, 25d supply, fill #0

## 2024-04-22 NOTE — Progress Notes (Signed)

## 2024-05-15 ENCOUNTER — Other Ambulatory Visit: Payer: Self-pay

## 2024-05-15 ENCOUNTER — Telehealth: Admitting: Physician Assistant

## 2024-05-15 DIAGNOSIS — B3731 Acute candidiasis of vulva and vagina: Secondary | ICD-10-CM | POA: Diagnosis not present

## 2024-05-15 MED ORDER — FLUCONAZOLE 150 MG PO TABS
150.0000 mg | ORAL_TABLET | ORAL | 0 refills | Status: DC | PRN
  Filled 2024-05-15: qty 2, 6d supply, fill #0

## 2024-05-15 NOTE — Progress Notes (Signed)

## 2024-05-28 ENCOUNTER — Other Ambulatory Visit: Payer: Self-pay

## 2024-05-28 ENCOUNTER — Ambulatory Visit: Payer: Self-pay

## 2024-05-28 ENCOUNTER — Encounter

## 2024-05-28 ENCOUNTER — Telehealth

## 2024-05-28 DIAGNOSIS — J329 Chronic sinusitis, unspecified: Secondary | ICD-10-CM | POA: Diagnosis not present

## 2024-05-28 MED ORDER — FLUTICASONE PROPIONATE 50 MCG/ACT NA SUSP
2.0000 | Freq: Every day | NASAL | 0 refills | Status: DC
  Filled 2024-05-28: qty 16, 30d supply, fill #0

## 2024-05-28 MED ORDER — DOXYCYCLINE HYCLATE 100 MG PO TABS
100.0000 mg | ORAL_TABLET | Freq: Two times a day (BID) | ORAL | 0 refills | Status: AC
  Filled 2024-05-28: qty 14, 7d supply, fill #0

## 2024-05-28 MED ORDER — FLUCONAZOLE 150 MG PO TABS
150.0000 mg | ORAL_TABLET | Freq: Once | ORAL | 0 refills | Status: AC
Start: 2024-05-28 — End: 2024-05-29
  Filled 2024-05-28: qty 1, 1d supply, fill #0

## 2024-05-28 NOTE — Progress Notes (Signed)
 Virtual Visit via Video Note  I connected with Olivia Henderson on 05/28/24 at  1:00 PM EDT by a video enabled telemedicine application and verified that I am speaking with the correct person using two identifiers.  Patient Location: Other:  Work Dispensing optician: Office/Clinic  I discussed the limitations, risks, security, and privacy concerns of performing an evaluation and management service by video and the availability of in person appointments. I also discussed with the patient that there may be a patient responsible charge related to this service. The patient expressed understanding and agreed to proceed.  Subjective: PCP: No primary care provider on file.  Chief Complaint  Patient presents with   Sinusitis    Patient says she has been symptomatic for about 3 days. Patient has been taking over the counter Mucinex . Patient says they recently went to the beach this weekend. Patient says since noticed mold in the hotel room after checking out.    Facial Pain   Nasal Congestion   HPI 4 days history of nasal congestion, facial pressure, mostly over facial sinuses, yellow thick nasal discharge, postnasal drip and bilateral ear fullness. Patient reports her husband has similar symptoms after their stay at a place which likely had mold on walls last weekend. Patient denies fever, chills, nausea, vomiting. She has been using saline nasal rinses, taken Mucinex . She has a history of recurrent sinusitis and was treated with Augmentin  in May 2025. She also takes daily Cetrizine. Has tried nasal Flonase  in the past but has not been using it regularly this time around. Has not seen ENT. Tends to get yeast infection when on antibiotic.   ROS: Per HPI  Current Outpatient Medications:    doxycycline  (VIBRA -TABS) 100 MG tablet, Take 1 tablet (100 mg total) by mouth 2 (two) times daily for 7 days., Disp: 14 tablet, Rfl: 0   fluconazole  (DIFLUCAN ) 150 MG tablet, Take 1 tablet (150 mg total)  by mouth once for 1 dose., Disp: 1 tablet, Rfl: 0   fluticasone  (FLONASE ) 50 MCG/ACT nasal spray, Place 2 sprays into both nostrils daily., Disp: 16 g, Rfl: 0   levonorgestrel  (MIRENA ) 20 MCG/24HR IUD, 1 each by Intrauterine route once., Disp: , Rfl:    levothyroxine  (SYNTHROID ) 100 MCG tablet, Take 1 tablet (100 mcg total) by mouth daily before breakfast., Disp: 90 tablet, Rfl: 1   Vitamin D , Ergocalciferol , (DRISDOL ) 1.25 MG (50000 UNIT) CAPS capsule, Take 1 capsule (50,000 Units total) by mouth every 7 (seven) days., Disp: 12 capsule, Rfl: 1  Observations/Objective: There were no vitals filed for this visit. Physical Exam Vitals reviewed: Not able to do due to nature of visit.      Assessment and Plan: Patient presenting via video visit for evaluation and management of symptoms suggestive of frontal sinusitis.   Recurrent sinusitis Assessment & Plan: Mostly affecting frontal sinuses based upon HPI.  Nasal flonase , 2 puffs in each nostril daily to reduce inflammation.  Nasal saline rinses 3-4 times a day prn to clear mucous and allergens.  Trial of Doxycycline  100 mg BID for 7 days.  Antibiotic side effects including diarrhea, C dif infection, vaginal yeast infection, antibiotic resistance, pill oesophagitis discussed with the patient.  She will take daily probiotic and 1 dose of Diflucan  if she develops vaginal yeast like symptoms.  If symptoms persists despite above measures I recommend patient be evaluated in the clinic. She will need ENT referral for further evaluation if recurrent sinusitis occurs.   Orders: -  Fluconazole ; Take 1 tablet (150 mg total) by mouth once for 1 dose.  Dispense: 1 tablet; Refill: 0 -     Fluticasone  Propionate; Place 2 sprays into both nostrils daily.  Dispense: 16 g; Refill: 0 -     Doxycycline  Hyclate; Take 1 tablet (100 mg total) by mouth 2 (two) times daily for 7 days.  Dispense: 14 tablet; Refill: 0    Follow Up Instructions: Return if  symptoms worsen or fail to improve.   I discussed the assessment and treatment plan with the patient. The patient was provided an opportunity to ask questions, and all were answered. The patient agreed with the plan and demonstrated an understanding of the instructions.   The patient was advised to call back or seek an in-person evaluation if the symptoms worsen or if the condition fails to improve as anticipated.  The above assessment and management plan was discussed with the patient. The patient verbalized understanding of and has agreed to the management plan.   Luke Shade, MD

## 2024-05-28 NOTE — Telephone Encounter (Signed)
 This RN made first attempt to contact patient with no answer. A voicemail was left with call back number provided.      Copied from CRM [id number]. Topic: Clinical - Pink Word Triage >> May 28, 2024  8:50 AM Pinkey ORN wrote: Reason for Triage: Sinuses >> May 28, 2024  8:52 AM Pinkey ORN wrote: Patient states she's been experiencing lots of sneezing, burning sensation and drainage due to her sinuses. Patient is wanting to schedule a virtual appointment. Patient call back number is [phone]

## 2024-05-28 NOTE — Telephone Encounter (Signed)
 Virtual appointment scheduled in the office for today 05/28/2024 at 1 PM with Kalpana Bair MD.  Patient requested a Virtual Appointment due to being at work today She states that she will come in if required     FYI Only or Action Required?: FYI only for provider.  Patient was last seen in primary care on 02/19/2024 by Hope Merle, MD.  Called Nurse Triage reporting Sinusitis.  Symptoms began several days ago.  Interventions attempted: Rest, hydration, or home remedies.  Symptoms are: gradually worsening.  Triage Disposition: See HCP Within 4 Hours (Or PCP Triage)  Patient/caregiver understands and will follow disposition?: Yes                    Reason for Disposition  [1] SEVERE sinus pain (e.g., excruciating) AND [2] not improved 2 hours after pain medicine  Answer Assessment - Initial Assessment Questions Just got back from the beach Mold was in the room they stayed in Sinus pressure/drainage Nasal congestion Started about 4 days ago        1. LOCATION: Where does it hurt?      Above teeth, face 2. ONSET: When did the sinus pain start?  (e.g., hours, days)      4 days ago 3. SEVERITY: How bad is the pain?   (Scale 0-10; or none, mild, moderate or severe)     9 4. RECURRENT SYMPTOM: Have you ever had sinus problems before? If Yes, ask: When was the last time? and What happened that time?      Yes 5. NASAL CONGESTION: Is the nose blocked? If Yes, ask: Can you open it or must you breathe through your mouth?     Yes--mouth breathing a lot 6. NASAL DISCHARGE: Do you have discharge from your nose? If so ask, What color?     Very thick but no color to it 7. FEVER: Do you have a fever? If Yes, ask: What is it, how was it measured, and when did it start?      No 8. OTHER SYMPTOMS: Do you have any other symptoms? (e.g., sore throat, cough, earache, difficulty breathing)     Slightly sore throat--inflamed,  9. PREGNANCY: Is  there any chance you are pregnant? When was your last menstrual period?     No  Protocols used: Sinus Pain or Congestion-A-AH

## 2024-05-28 NOTE — Assessment & Plan Note (Signed)
 Mostly affecting frontal sinuses based upon HPI.  Nasal flonase , 2 puffs in each nostril daily to reduce inflammation.  Nasal saline rinses 3-4 times a day prn to clear mucous and allergens.  Trial of Doxycycline  100 mg BID for 7 days.  Antibiotic side effects including diarrhea, C dif infection, vaginal yeast infection, antibiotic resistance, pill oesophagitis discussed with the patient.  She will take daily probiotic and 1 dose of Diflucan  if she develops vaginal yeast like symptoms.  If symptoms persists despite above measures I recommend patient be evaluated in the clinic. She will need ENT referral for further evaluation if recurrent sinusitis occurs.

## 2024-06-15 ENCOUNTER — Telehealth: Admitting: Nurse Practitioner

## 2024-06-15 DIAGNOSIS — R2242 Localized swelling, mass and lump, left lower limb: Secondary | ICD-10-CM

## 2024-06-15 DIAGNOSIS — T63481D Toxic effect of venom of other arthropod, accidental (unintentional), subsequent encounter: Secondary | ICD-10-CM

## 2024-06-16 MED ORDER — PREDNISONE 20 MG PO TABS: 20.0000 mg | ORAL_TABLET | Freq: Every day | ORAL | 0 refills | Status: AC

## 2024-06-16 NOTE — Progress Notes (Signed)
 I have spent 5 minutes in review of e-visit questionnaire, review and updating patient chart, medical decision making and response to patient.   Claiborne Rigg, NP

## 2024-06-16 NOTE — Progress Notes (Signed)
 E-Visit for Insect Sting  Thank you for describing the insect sting for us .  Here is how we plan to help!  A sting that we will treat with a short course of prednisone .  The 2 greatest risks from insect stings are allergic reaction, which can be fatal in some people and infection, which is more common and less serious.  Bees, wasps, yellow jackets, and hornets belong to a class of insects called Hymenoptera.  Most insect stings cause only minor discomfort.  Stings can happen anywhere on the body and can be painful.  Most stings are from honey bees or yellow jackets.  Fire ants can sting multiple times.  The sites of the stings are more likely to become infected.    I have sent in prednisone  20 mg by mouth daily for 5 days to the pharmacy you selected.  Please make sure that you selected a pharmacy that is open now.  What can be used to prevent Insect Stings?  Insect repellant with at least 20% DEET.  Wearing long pants and shirts with socks and shoes.  Wear dark or drab-colored clothes rather than bright colors.  Avoid using perfumes and hair sprays; these attract insects.  HOME CARE ADVICE:  1. Stinger removal: The stinger looks like a tiny black dot in the sting. Use a fingernail, credit card edge, or knife-edge to scrape it off.  Don't pull it out because it squeezes out more venom. If the stinger is below the skin surface, leave it alone.  It will be shed with normal skin healing. 2. Use cold compresses to the area of the sting for 10-20 minutes.  You may repeat this as needed to relieve symptoms of pain and swelling. 3.  For pain relief, take acetominophen 650 mg 4-6 hours as needed or ibuprofen  400 mg every 6-8 hours as needed or naproxen 250-500 mg every 12 hours as needed. 4.  You can also use hydrocortisone cream 0.5% or 1% up to 4 times daily as needed for itching. 5.  If the sting becomes very itchy, take Benadryl  25-50 mg, follow directions on box. 6.  Wash the area 2-3  times daily with antibacterial soap and warm water. 7. Call your Doctor if: Fever, a severe headache, or rash occur in the next 2 weeks. Sting area begins to look infected. Redness and swelling worsens after home treatment. Your current symptoms become worse.    MAKE SURE YOU:  Understand these instructions. Will watch your condition. Will get help right away if you are not doing well or get worse.  Thank you for choosing an e-visit.  Your e-visit answers were reviewed by a board certified advanced clinical practitioner to complete your personal care plan. Depending upon the condition, your plan could have included both over the counter or prescription medications.  Please review your pharmacy choice. Make sure the pharmacy is open so you can pick up prescription now. If there is a problem, you may contact your provider through [employer] and have the prescription routed to another pharmacy.  Your safety is important to us . If you have drug allergies check your prescription carefully.   For the next 24 hours you can use MyChart to ask questions about today's visit, request a non-urgent call back, or ask for a work or school excuse. You will get an email in the next two days asking about your experience. I hope that your e-visit has been valuable and will speed your recovery.

## 2024-08-01 ENCOUNTER — Encounter (HOSPITAL_COMMUNITY): Payer: Self-pay | Admitting: Pharmacist

## 2024-08-01 ENCOUNTER — Other Ambulatory Visit (HOSPITAL_COMMUNITY): Payer: Self-pay

## 2024-08-02 ENCOUNTER — Other Ambulatory Visit: Payer: Self-pay

## 2024-08-02 ENCOUNTER — Encounter: Payer: Self-pay | Admitting: Pharmacist

## 2024-08-07 ENCOUNTER — Other Ambulatory Visit: Payer: Self-pay

## 2024-08-23 ENCOUNTER — Telehealth: Admitting: Family Medicine

## 2024-08-23 ENCOUNTER — Other Ambulatory Visit: Payer: Self-pay

## 2024-08-23 DIAGNOSIS — J019 Acute sinusitis, unspecified: Secondary | ICD-10-CM | POA: Diagnosis not present

## 2024-08-23 DIAGNOSIS — B9789 Other viral agents as the cause of diseases classified elsewhere: Secondary | ICD-10-CM | POA: Diagnosis not present

## 2024-08-23 DIAGNOSIS — J329 Chronic sinusitis, unspecified: Secondary | ICD-10-CM

## 2024-08-23 MED ORDER — FLUTICASONE PROPIONATE 50 MCG/ACT NA SUSP
2.0000 | Freq: Every day | NASAL | 0 refills | Status: DC
  Filled 2024-08-23: qty 16, 30d supply, fill #0

## 2024-08-23 NOTE — Progress Notes (Signed)
 We are sorry that you are not feeling well.  Here is how we plan to help!  Based on what you have shared with me it looks like you have sinusitis.  Sinusitis is inflammation and infection in the sinus cavities of the head.  Based on your presentation I believe you most likely have Acute Viral Sinusitis.This is an infection most likely caused by a virus. There is not specific treatment for viral sinusitis other than to help you with the symptoms until the infection runs its course.  You may use an oral decongestant such as Mucinex  D or if you have glaucoma or high blood pressure use plain Mucinex . Saline nasal spray help and can safely be used as often as needed for congestion, I have prescribed: Fluticasone  nasal spray two sprays in each nostril once a day  Some authorities believe that zinc sprays or the use of Echinacea may shorten the course of your symptoms.  Sinus infections are not as easily transmitted as other respiratory infection, however we still recommend that you avoid close contact with loved ones, especially the very young and elderly.  Remember to wash your hands thoroughly throughout the day as this is the number one way to prevent the spread of infection!  Home Care: Only take medications as instructed by your medical team. Do not take these medications with alcohol. A steam or ultrasonic humidifier can help congestion.  You can place a towel over your head and breathe in the steam from hot water coming from a faucet. Avoid close contacts especially the very young and the elderly. Cover your mouth when you cough or sneeze. Always remember to wash your hands.  Get Help Right Away If: You develop worsening fever or sinus pain. You develop a severe head ache or visual changes. Your symptoms persist after you have completed your treatment plan.  Make sure you Understand these instructions. Will watch your condition. Will get help right away if you are not doing well or get  worse.  Your e-visit answers were reviewed by a board certified advanced clinical practitioner to complete your personal care plan.  Depending on the condition, your plan could have included both over the counter or prescription medications.  If there is a problem please reply  once you have received a response from your provider.  Your safety is important to us .  If you have drug allergies check your prescription carefully.    You can use MyChart to ask questions about today's visit, request a non-urgent call back, or ask for a work or school excuse for 24 hours related to this e-Visit. If it has been greater than 24 hours you will need to follow up with your provider, or enter a new e-Visit to address those concerns.  You will get an e-mail in the next two days asking about you5r experience.  I hope that your e-visit has been valuable and will speed your recovery. Thank you for using e-visits.  I have spent 5 minutes in review of e-visit questionnaire, review and updating patient chart, medical decision making and response to patient.   Jordan Caraveo, FNP

## 2024-08-24 ENCOUNTER — Other Ambulatory Visit: Payer: Self-pay

## 2024-08-26 ENCOUNTER — Encounter: Payer: Self-pay | Admitting: Family Medicine

## 2024-08-26 ENCOUNTER — Ambulatory Visit: Payer: Self-pay

## 2024-08-26 ENCOUNTER — Ambulatory Visit: Admitting: Family Medicine

## 2024-08-26 ENCOUNTER — Other Ambulatory Visit: Payer: Self-pay

## 2024-08-26 VITALS — BP 118/72 | HR 77 | Temp 98.8°F | Ht 62.0 in | Wt 201.2 lb

## 2024-08-26 DIAGNOSIS — R051 Acute cough: Secondary | ICD-10-CM | POA: Diagnosis not present

## 2024-08-26 DIAGNOSIS — J0110 Acute frontal sinusitis, unspecified: Secondary | ICD-10-CM | POA: Diagnosis not present

## 2024-08-26 LAB — POC COVID19 BINAXNOW: SARS Coronavirus 2 Ag: NEGATIVE

## 2024-08-26 MED ORDER — GUAIFENESIN-CODEINE 100-10 MG/5ML PO SOLN
5.0000 mL | Freq: Three times a day (TID) | ORAL | 0 refills | Status: AC | PRN
  Filled 2024-08-26: qty 120, 8d supply, fill #0

## 2024-08-26 MED ORDER — FLUCONAZOLE 150 MG PO TABS
150.0000 mg | ORAL_TABLET | Freq: Once | ORAL | 0 refills | Status: AC
  Filled 2024-08-26: qty 1, 1d supply, fill #0

## 2024-08-26 MED ORDER — AMOXICILLIN-POT CLAVULANATE 875-125 MG PO TABS
1.0000 | ORAL_TABLET | Freq: Two times a day (BID) | ORAL | 0 refills | Status: AC
  Filled 2024-08-26 (×2): qty 20, 10d supply, fill #0

## 2024-08-26 NOTE — Assessment & Plan Note (Addendum)
 Acute,  anticipate viral given short duration, also children recently ill - again pointing to viral illness.  COVID negative today.  Supportive measures reviewed -Rx flonase , codeine  cough syrup for night time cough, continue nasal saline and other OTC remedies. WASP for augmentin  printed with indications when to fill. R diflucan  to treat abx associated yeast vaginitis if needed.  Pt agrees wit hplan.

## 2024-08-26 NOTE — Patient Instructions (Signed)
 VISIT SUMMARY: Today, you were seen for symptoms of a sinus infection, including cough, sinus pressure, headache, and sore throat. Your symptoms started 3-4 days ago and have been causing significant discomfort, especially at night.  YOUR PLAN: -ACUTE UPPER RESPIRATORY INFECTION WITH ACUTE SINUSITIS AND COUGH: You have a viral sinus infection, which is causing your severe cough and other symptoms. We will start you on Flonase  nasal spray to help with the congestion. You will also take codeine  cough syrup with guaifenesin  up to three times daily to manage your cough, but be cautious as it can cause drowsiness. Make sure to drink plenty of fluids and get rest. We are holding off on antibiotics for now, but if your symptoms worsen or do not improve in the next 3-4 days, you can fill the Augmentin  prescription. Additionally, we have sent a prescription for Diflucan  to prevent a yeast infection if you need to take the antibiotics.  INSTRUCTIONS: If your symptoms worsen or do not improve in the next 24-48 hours, please fill the Augmentin  prescription and start taking it as directed. Follow up with us  if you have any concerns or if your condition does not improve.

## 2024-08-26 NOTE — Telephone Encounter (Signed)
 Patient is scheduled to see Dr. KANDICE at [facility]

## 2024-08-26 NOTE — Progress Notes (Signed)
 Ph: [phone] Fax: [phone]   Patient ID: Olivia Henderson, female    DOB: 12-Jan-1991, 33 y.o.   MRN: [id number]  This visit was conducted in person.  BP 118/72   Pulse 77   Temp 98.8 F (37.1 C) (Oral)   Ht 5' 2 (1.575 m)   Wt 201 lb 4 oz (91.3 kg)   SpO2 98%   BMI 36.81 kg/m    Chief Complaint  Patient presents with   Cough    Pt here for Cough, sinus pressure, headache, and sore throat. Started 08/23/24 Pt took mucinex  and delsym . No fever    Subjective:   Discussed the use of AI scribe software for clinical note transcription with the patient, who gave verbal consent to proceed.  History of Present Illness   Olivia Henderson is a 32 year old female with a history of frequent sinus infections who presents with cough, sinus pressure, headache, and sore throat.  Symptoms began on Friday morning with nasal congestion, sinus pressure, sneezing, cough, and drainage. By Saturday night, she developed a severe sinus headache on the left side, extending from the forehead to the back of the head, with associated tooth pain and ear congestion. Her ears feel stopped up and popping. Initially, she had a sore throat, likely due to persistent coughing. The cough is severe, especially at night, causing sleep disturbances.  She denies fever, chills, wheezing, shortness of breath, ear pain, abdominal pain, diarrhea, nausea, or body aches, but notes some fatigue this morning. Her children recently had similar symptoms but have recovered. Her daughter tested negative for various conditions. A home COVID test on Saturday morning was negative.  She has been using over-the-counter medications such as Mucinex , Delsym, and Robitussin with little relief. For allergies, she rotates between Zyrtec, Claritin, and Xyzal, and uses Flonase , though she has run out of it recently. She typically uses saline nasal spray when well. She has previously used codeine  cough syrup with success,  and Tessalon  Perles have not been effective for her cough.       Tessalon  perls don't help.      Relevant past medical, surgical, family and social history reviewed and updated as indicated. Interim medical history since our last visit reviewed. Allergies and medications reviewed and updated. Outpatient Medications Prior to Visit  Medication Sig Dispense Refill   fluticasone  (FLONASE ) 50 MCG/ACT nasal spray Place 2 sprays into both nostrils daily. 16 g 0   levonorgestrel  (MIRENA ) 20 MCG/24HR IUD 1 each by Intrauterine route once.     levothyroxine  (SYNTHROID ) 100 MCG tablet Take 1 tablet (100 mcg total) by mouth daily before breakfast. 90 tablet 1   Vitamin D , Ergocalciferol , (DRISDOL ) 1.25 MG (50000 UNIT) CAPS capsule Take 1 capsule (50,000 Units total) by mouth every 7 (seven) days. (Patient not taking: Reported on 08/26/2024) 12 capsule 1   No facility-administered medications prior to visit.     Per HPI unless specifically indicated in ROS section below Review of Systems  Objective:  BP 118/72   Pulse 77   Temp 98.8 F (37.1 C) (Oral)   Ht 5' 2 (1.575 m)   Wt 201 lb 4 oz (91.3 kg)   SpO2 98%   BMI 36.81 kg/m   Wt Readings from Last 3 Encounters:  08/26/24 201 lb 4 oz (91.3 kg)  02/19/24 199 lb 2 oz (90.3 kg)  09/06/23 198 lb 2 oz (89.9 kg)      Physical Exam Vitals and nursing note  reviewed.  Constitutional:      Appearance: Normal appearance. She is not ill-appearing.  HENT:     Head: Normocephalic and atraumatic.     Right Ear: Tympanic membrane, ear canal and external ear normal. There is no impacted cerumen.     Left Ear: Tympanic membrane, ear canal and external ear normal. There is no impacted cerumen.     Nose: Mucosal edema and congestion present. No rhinorrhea.     Right Turbinates: Not enlarged or swollen.     Left Turbinates: Not enlarged or swollen.     Right Sinus: No maxillary sinus tenderness or frontal sinus tenderness.     Left Sinus: Frontal  sinus tenderness present. No maxillary sinus tenderness.     Mouth/Throat:     Mouth: Mucous membranes are moist.     Pharynx: Oropharynx is clear. No oropharyngeal exudate or posterior oropharyngeal erythema.  Eyes:     Extraocular Movements: Extraocular movements intact.     Conjunctiva/sclera: Conjunctivae normal.     Pupils: Pupils are equal, round, and reactive to light.  Cardiovascular:     Rate and Rhythm: Normal rate and regular rhythm.     Pulses: Normal pulses.     Heart sounds: Normal heart sounds. No murmur heard. Pulmonary:     Effort: Pulmonary effort is normal. No respiratory distress.     Breath sounds: Normal breath sounds. No wheezing, rhonchi or rales.  Lymphadenopathy:     Head:     Right side of head: No submental, submandibular, tonsillar, preauricular or posterior auricular adenopathy.     Left side of head: No submental, submandibular, tonsillar, preauricular or posterior auricular adenopathy.     Cervical: No cervical adenopathy.     Right cervical: No superficial cervical adenopathy.    Left cervical: No superficial cervical adenopathy.     Upper Body:     Right upper body: No supraclavicular adenopathy.     Left upper body: No supraclavicular adenopathy.  Skin:    Findings: No rash.  Neurological:     Mental Status: She is alert.  Psychiatric:        Mood and Affect: Mood normal.        Behavior: Behavior normal.       Results           Results for orders placed or performed in visit on 08/26/24  POC COVID-19 BinaxNow   Collection Time: 08/26/24  3:35 PM  Result Value Ref Range   SARS Coronavirus 2 Ag Negative Negative    Assessment & Plan:      Acute upper respiratory infection with acute sinusitis and cough Symptoms suggest viral sinusitis with severe cough. Negative COVID test. Differential includes viral versus bacterial sinusitis, with current symptoms more consistent with viral etiology. - Start Flonase  nasal spray. - Prescribe  codeine  cough syrup with guaifenesin , up to three times daily, with sedation precautions. - Encourage increased fluid intake and rest. - Hold antibiotics for 3-4 days; if symptoms worsen or persist, fill Augmentin  prescription. - Send Diflucan  prescription to prevent yeast infection with antibiotic use.  Recording duration: 11 minutes       Problem List Items Addressed This Visit     Sinusitis - Primary   Acute,  anticipate viral given short duration, also children recently ill - again pointing to viral illness.  COVID negative today.  Supportive measures reviewed -Rx flonase , codeine  cough syrup for night time cough, continue nasal saline and other OTC remedies. WASP for augmentin   printed with indications when to fill. R diflucan  to treat abx associated yeast vaginitis if needed.  Pt agrees wit hplan.       Relevant Medications   guaiFENesin -codeine  100-10 MG/5ML syrup   amoxicillin -clavulanate (AUGMENTIN ) 875-125 MG tablet   fluconazole  (DIFLUCAN ) 150 MG tablet   Other Visit Diagnoses       Acute cough       Relevant Orders   POC COVID-19 BinaxNow (Completed)        Meds ordered this encounter  Medications   guaiFENesin -codeine  100-10 MG/5ML syrup    Sig: Take 5 mLs by mouth 3 (three) times daily as needed for cough (sedation precautions).    Dispense:  120 mL    Refill:  0   amoxicillin -clavulanate (AUGMENTIN ) 875-125 MG tablet    Sig: Take 1 tablet by mouth 2 (two) times daily for 10 days.    Dispense:  20 tablet    Refill:  0   fluconazole  (DIFLUCAN ) 150 MG tablet    Sig: Take 1 tablet (150 mg total) by mouth once for 1 dose.    Dispense:  1 tablet    Refill:  0    Orders Placed This Encounter  Procedures   POC COVID-19 BinaxNow    Patient Instructions  VISIT SUMMARY: Today, you were seen for symptoms of a sinus infection, including cough, sinus pressure, headache, and sore throat. Your symptoms started 3-4 days ago and have been causing significant  discomfort, especially at night.  YOUR PLAN: -ACUTE UPPER RESPIRATORY INFECTION WITH ACUTE SINUSITIS AND COUGH: You have a viral sinus infection, which is causing your severe cough and other symptoms. We will start you on Flonase  nasal spray to help with the congestion. You will also take codeine  cough syrup with guaifenesin  up to three times daily to manage your cough, but be cautious as it can cause drowsiness. Make sure to drink plenty of fluids and get rest. We are holding off on antibiotics for now, but if your symptoms worsen or do not improve in the next 3-4 days, you can fill the Augmentin  prescription. Additionally, we have sent a prescription for Diflucan  to prevent a yeast infection if you need to take the antibiotics.  INSTRUCTIONS: If your symptoms worsen or do not improve in the next 24-48 hours, please fill the Augmentin  prescription and start taking it as directed. Follow up with us  if you have any concerns or if your condition does not improve.  Follow up plan: Return if symptoms worsen or fail to improve.  Anton Blas, MD

## 2024-08-26 NOTE — Telephone Encounter (Signed)
 FYI Only or Action Required?: FYI only for provider.  Patient was last seen in primary care on 05/28/2024 by Bair, Kalpana, MD.  Called Nurse Triage reporting Cough.  Symptoms began several days ago.  Interventions attempted: OTC medications: Delsym, Robitussin.  Symptoms are: gradually worsening.  Triage Disposition: See HCP Within 4 Hours (Or PCP Triage)  Patient/caregiver understands and will follow disposition?: Yes   Copied from CRM #[id number]. Topic: Clinical - Red Word Triage >> Aug 26, 2024  8:50 AM Ivette P wrote: Red Word that prompted transfer to Nurse Triage: sinus infection since friday. getting worse.   congestion, chest and back are sore form coughing,. nasal, head congestion. sinus headache. Reason for Disposition  Wheezing is present  Answer Assessment - Initial Assessment Questions Testive negative for Covid with home test on Saturday morning  1. ONSET: When did the cough begin?      Friday 2. SEVERITY: How bad is the cough today?      Chest and back are sore from coughing, up all night coughing 3. SPUTUM: Describe the color of your sputum (e.g., none, dry cough; clear, white, yellow, green)     No sputum 4. HEMOPTYSIS: Are you coughing up any blood? If Yes, ask: How much? (e.g., flecks, streaks, tablespoons, etc.)     No blood 5. DIFFICULTY BREATHING: Are you having difficulty breathing? If Yes, ask: How bad is it? (e.g., mild, moderate, severe)      No 6. FEVER: Do you have a fever? If Yes, ask: What is your temperature, how was it measured, and when did it start?     No 7. CARDIAC HISTORY: Do you have any history of heart disease? (e.g., heart attack, congestive heart failure)      No 8. LUNG HISTORY: Do you have any history of lung disease?  (e.g., pulmonary embolus, asthma, emphysema)     No 10. OTHER SYMPTOMS: Do you have any other symptoms? (e.g., runny nose, wheezing, chest pain)       Mild wheezing 11. PREGNANCY: Is there  any chance you are pregnant? When was your last menstrual period?       No 12. TRAVEL: Have you traveled out of the country in the last month? (e.g., travel history, exposures)       No  Protocols used: Cough - Acute Productive-A-AH

## 2024-08-27 ENCOUNTER — Other Ambulatory Visit: Payer: Self-pay

## 2024-09-12 ENCOUNTER — Encounter: Payer: Self-pay | Admitting: Pharmacist

## 2024-09-12 ENCOUNTER — Other Ambulatory Visit (HOSPITAL_COMMUNITY): Payer: Self-pay

## 2024-09-12 ENCOUNTER — Other Ambulatory Visit: Payer: Self-pay

## 2024-09-17 ENCOUNTER — Other Ambulatory Visit: Payer: Self-pay

## 2024-09-18 ENCOUNTER — Other Ambulatory Visit (HOSPITAL_COMMUNITY): Payer: Self-pay

## 2024-09-19 ENCOUNTER — Other Ambulatory Visit: Payer: Self-pay

## 2024-09-19 ENCOUNTER — Other Ambulatory Visit (HOSPITAL_COMMUNITY): Payer: Self-pay

## 2024-09-22 ENCOUNTER — Other Ambulatory Visit (HOSPITAL_COMMUNITY): Payer: Self-pay

## 2024-09-22 ENCOUNTER — Other Ambulatory Visit: Payer: Self-pay

## 2024-10-01 ENCOUNTER — Other Ambulatory Visit (HOSPITAL_COMMUNITY): Payer: Self-pay

## 2024-10-04 ENCOUNTER — Other Ambulatory Visit: Payer: Self-pay

## 2024-10-04 ENCOUNTER — Telehealth: Admitting: Emergency Medicine

## 2024-10-04 DIAGNOSIS — R051 Acute cough: Secondary | ICD-10-CM | POA: Diagnosis not present

## 2024-10-04 MED ORDER — DOXYCYCLINE HYCLATE 100 MG PO CAPS
100.0000 mg | ORAL_CAPSULE | Freq: Two times a day (BID) | ORAL | 0 refills | Status: AC
  Filled 2024-10-04 (×2): qty 20, 10d supply, fill #0

## 2024-10-04 MED ORDER — PREDNISONE 20 MG PO TABS
40.0000 mg | ORAL_TABLET | Freq: Every day | ORAL | 0 refills | Status: AC
  Filled 2024-10-04 (×2): qty 10, 5d supply, fill #0

## 2024-10-04 MED ORDER — BENZONATATE 100 MG PO CAPS
100.0000 mg | ORAL_CAPSULE | Freq: Two times a day (BID) | ORAL | 0 refills | Status: AC | PRN
  Filled 2024-10-04: qty 20, 10d supply, fill #0

## 2024-10-04 NOTE — Progress Notes (Signed)
 We are sorry that you are not feeling well.  Here is how we plan to help!  Based on your presentation I believe you most likely have A cough due to bacteria.  When patients have a fever and a productive cough with a change in color or increased sputum production, we are concerned about bacterial bronchitis.  If left untreated it can progress to pneumonia.  If your symptoms do not improve with your treatment plan it is important that you contact your provider.   I have prescribed Doxycycline  100 mg twice a day for 7 days     In addition you may use A prescription cough medication called Tessalon  Perles 100mg . You may take 1-2 capsules every 8 hours as needed for your cough.    From your responses in the eVisit questionnaire you describe inflammation in the upper respiratory tract which is causing a significant cough.  This is commonly called Bronchitis and has four common causes:   Allergies Viral Infections Acid Reflux Bacterial Infection Allergies, viruses and acid reflux are treated by controlling symptoms or eliminating the cause. An example might be a cough caused by taking certain blood pressure medications. You stop the cough by changing the medication. Another example might be a cough caused by acid reflux. Controlling the reflux helps control the cough.  USE OF BRONCHODILATOR (RESCUE) INHALERS: There is a risk from using your bronchodilator too frequently.  The risk is that over-reliance on a medication which only relaxes the muscles surrounding the breathing tubes can reduce the effectiveness of medications prescribed to reduce swelling and congestion of the tubes themselves.  Although you feel brief relief from the bronchodilator inhaler, your asthma may actually be worsening with the tubes becoming more swollen and filled with mucus.  This can delay other crucial treatments, such as oral steroid medications. If you need to use a bronchodilator inhaler daily, several times per day, you  should discuss this with your provider.  There are probably better treatments that could be used to keep your asthma under control.     HOME CARE Only take medications as instructed by your medical team. Complete the entire course of an antibiotic. Drink plenty of fluids and get plenty of rest. Avoid close contacts especially the very young and the elderly Cover your mouth if you cough or cough into your sleeve. Always remember to wash your hands A steam or ultrasonic humidifier can help congestion.   GET HELP RIGHT AWAY IF: You develop worsening fever. You become short of breath You cough up blood. Your symptoms persist after you have completed your treatment plan MAKE SURE YOU  Understand these instructions. Will watch your condition. Will get help right away if you are not doing well or get worse.  Your e-visit answers were reviewed by a board certified advanced clinical practitioner to complete your personal care plan.  Depending on the condition, your plan could have included both over the counter or prescription medications. If there is a problem please reply  once you have received a response from your provider. Your safety is important to us .  If you have drug allergies check your prescription carefully.    You can use MyChart to ask questions about today's visit, request a non-urgent call back, or ask for a work or school excuse for 24 hours related to this e-Visit. If it has been greater than 24 hours you will need to follow up with your provider, or enter a new e-Visit to address those concerns.  You will get an e-mail in the next two days asking about your experience.  I hope that your e-visit has been valuable and will speed your recovery. Thank you for using e-visits.   I have spent 5 minutes in review of e-visit questionnaire, review and updating patient chart, medical decision making and response to patient.   Lamar Schlossman, PA-C

## 2024-10-04 NOTE — Addendum Note (Signed)
 Addended by: VICKY CHARLESTON B on: 10/04/2024 10:25 AM   Modules accepted: Orders

## 2024-11-04 ENCOUNTER — Other Ambulatory Visit: Payer: Self-pay

## 2024-11-04 ENCOUNTER — Telehealth: Admitting: Physician Assistant

## 2024-11-04 DIAGNOSIS — B9689 Other specified bacterial agents as the cause of diseases classified elsewhere: Secondary | ICD-10-CM | POA: Diagnosis not present

## 2024-11-04 DIAGNOSIS — J019 Acute sinusitis, unspecified: Secondary | ICD-10-CM

## 2024-11-04 MED ORDER — FLUTICASONE PROPIONATE 50 MCG/ACT NA SUSP
2.0000 | Freq: Every day | NASAL | 0 refills | Status: AC
  Filled 2024-11-04: qty 16, 30d supply, fill #0

## 2024-11-04 MED ORDER — AMOXICILLIN-POT CLAVULANATE 875-125 MG PO TABS
1.0000 | ORAL_TABLET | Freq: Two times a day (BID) | ORAL | 0 refills | Status: AC
  Filled 2024-11-04: qty 14, 7d supply, fill #0

## 2024-11-04 NOTE — Progress Notes (Signed)

## 2024-11-06 ENCOUNTER — Other Ambulatory Visit: Payer: Self-pay

## 2024-11-06 MED ORDER — FLUCONAZOLE 150 MG PO TABS
150.0000 mg | ORAL_TABLET | Freq: Every day | ORAL | 0 refills | Status: AC
  Filled 2024-11-06: qty 2, 2d supply, fill #0

## 2024-11-06 MED ORDER — ETODOLAC 400 MG PO TABS
400.0000 mg | ORAL_TABLET | Freq: Three times a day (TID) | ORAL | 0 refills | Status: AC
  Filled 2024-11-06: qty 30, 10d supply, fill #0

## 2024-11-06 MED ORDER — METRONIDAZOLE 500 MG PO TABS
500.0000 mg | ORAL_TABLET | Freq: Three times a day (TID) | ORAL | 0 refills | Status: AC
  Filled 2024-11-06: qty 21, 7d supply, fill #0

## 2024-11-25 ENCOUNTER — Encounter: Admitting: Internal Medicine

## 2025-02-18 ENCOUNTER — Encounter

## 2025-03-03 ENCOUNTER — Encounter: Admitting: Internal Medicine
